# Patient Record
Sex: Male | Born: 1950
Health system: Southern US, Community
[De-identification: ages and names within clinical notes are randomized; demographics above are authoritative.]

## PROBLEM LIST (undated history)

## (undated) DIAGNOSIS — I10 Essential (primary) hypertension: Secondary | ICD-10-CM

## (undated) DIAGNOSIS — Z9889 Other specified postprocedural states: Secondary | ICD-10-CM

## (undated) DIAGNOSIS — T8859XA Other complications of anesthesia, initial encounter: Secondary | ICD-10-CM

## (undated) DIAGNOSIS — K219 Gastro-esophageal reflux disease without esophagitis: Secondary | ICD-10-CM

## (undated) DIAGNOSIS — R42 Dizziness and giddiness: Secondary | ICD-10-CM

## (undated) DIAGNOSIS — M1990 Unspecified osteoarthritis, unspecified site: Secondary | ICD-10-CM

## (undated) DIAGNOSIS — R112 Nausea with vomiting, unspecified: Secondary | ICD-10-CM

## (undated) DIAGNOSIS — T7840XA Allergy, unspecified, initial encounter: Secondary | ICD-10-CM

## (undated) DIAGNOSIS — I639 Cerebral infarction, unspecified: Secondary | ICD-10-CM

## (undated) DIAGNOSIS — T4145XA Adverse effect of unspecified anesthetic, initial encounter: Secondary | ICD-10-CM

## (undated) DIAGNOSIS — Z974 Presence of external hearing-aid: Secondary | ICD-10-CM

## (undated) DIAGNOSIS — Z972 Presence of dental prosthetic device (complete) (partial): Secondary | ICD-10-CM

## (undated) DIAGNOSIS — G4730 Sleep apnea, unspecified: Secondary | ICD-10-CM

## (undated) DIAGNOSIS — D689 Coagulation defect, unspecified: Secondary | ICD-10-CM

## (undated) DIAGNOSIS — J189 Pneumonia, unspecified organism: Secondary | ICD-10-CM

## (undated) DIAGNOSIS — G459 Transient cerebral ischemic attack, unspecified: Secondary | ICD-10-CM

## (undated) DIAGNOSIS — L570 Actinic keratosis: Secondary | ICD-10-CM

## (undated) HISTORY — PX: EYE SURGERY: SHX253

## (undated) HISTORY — DX: Allergy, unspecified, initial encounter: T78.40XA

## (undated) HISTORY — DX: Coagulation defect, unspecified: D68.9

## (undated) HISTORY — DX: Other specified postprocedural states: Z98.890

## (undated) HISTORY — PX: JOINT REPLACEMENT: SHX530

## (undated) HISTORY — DX: Actinic keratosis: L57.0

---

## 2005-11-25 HISTORY — PX: CHOLECYSTECTOMY: SHX55

## 2005-11-25 HISTORY — PX: KNEE ARTHROSCOPY: SUR90

## 2006-08-08 ENCOUNTER — Other Ambulatory Visit: Payer: Self-pay

## 2006-08-11 ENCOUNTER — Ambulatory Visit: Payer: Self-pay | Admitting: Surgery

## 2006-10-23 ENCOUNTER — Ambulatory Visit: Payer: Self-pay

## 2006-11-10 ENCOUNTER — Ambulatory Visit: Payer: Self-pay | Admitting: Orthopaedic Surgery

## 2008-09-22 ENCOUNTER — Observation Stay: Payer: Self-pay | Admitting: Cardiology

## 2010-09-20 ENCOUNTER — Ambulatory Visit: Payer: Self-pay | Admitting: Family Medicine

## 2011-01-24 DIAGNOSIS — R42 Dizziness and giddiness: Secondary | ICD-10-CM

## 2011-01-24 HISTORY — DX: Dizziness and giddiness: R42

## 2012-01-30 ENCOUNTER — Emergency Department: Payer: Self-pay | Admitting: Internal Medicine

## 2012-01-30 LAB — COMPREHENSIVE METABOLIC PANEL
Albumin: 4.1 g/dL (ref 3.4–5.0)
Alkaline Phosphatase: 123 U/L (ref 50–136)
Alkaline Phosphatase: 120 U/L (ref 50–136)
Anion Gap: 16 (ref 7–16)
BUN: 11 mg/dL (ref 7–18)
Bilirubin,Total: 1 mg/dL (ref 0.2–1.0)
Bilirubin,Total: 1 mg/dL (ref 0.2–1.0)
Calcium, Total: 8.6 mg/dL (ref 8.5–10.1)
Chloride: 105 mmol/L (ref 98–107)
Creatinine: 0.98 mg/dL (ref 0.60–1.30)
Creatinine: 0.87 mg/dL (ref 0.60–1.30)
EGFR (African American): >60
EGFR (Non-African Amer.): >60
Glucose: 96 mg/dL (ref 65–99)
Osmolality: 288 (ref 275–301)
Potassium: 3.1 mmol/L — ABNORMAL LOW (ref 3.5–5.1)
Potassium: 3.8 mmol/L (ref 3.5–5.1)
SGOT(AST): 42 U/L — ABNORMAL HIGH (ref 15–37)
SGOT(AST): 41 U/L — ABNORMAL HIGH (ref 15–37)
SGPT (ALT): 38 U/L
Sodium: 145 mmol/L (ref 136–145)
Total Protein: 7.9 g/dL (ref 6.4–8.2)
Total Protein: 7.6 g/dL (ref 6.4–8.2)

## 2012-01-30 LAB — CBC
HCT: 43.3 % (ref 40.0–52.0)
HGB: 15 g/dL (ref 13.0–18.0)
MCH: 31.1 pg (ref 26.0–34.0)
MCHC: 34.6 g/dL (ref 32.0–36.0)
MCV: 90 fL (ref 80–100)

## 2012-01-30 LAB — TROPONIN I: Troponin-I: <0.02 ng/mL

## 2012-01-30 LAB — LIPASE, BLOOD: Lipase: 130 U/L (ref 73–393)

## 2012-01-30 LAB — CK TOTAL AND CKMB (NOT AT ARMC)
CK, Total: 168 U/L (ref 35–232)
CK-MB: 1.5 ng/mL (ref 0.5–3.6)

## 2014-01-05 DIAGNOSIS — Z1339 Encounter for screening examination for other mental health and behavioral disorders: Secondary | ICD-10-CM | POA: Diagnosis not present

## 2014-01-05 DIAGNOSIS — Z131 Encounter for screening for diabetes mellitus: Secondary | ICD-10-CM | POA: Diagnosis not present

## 2014-01-05 DIAGNOSIS — Z1212 Encounter for screening for malignant neoplasm of rectum: Secondary | ICD-10-CM | POA: Diagnosis not present

## 2014-01-05 DIAGNOSIS — Z125 Encounter for screening for malignant neoplasm of prostate: Secondary | ICD-10-CM | POA: Diagnosis not present

## 2014-01-05 DIAGNOSIS — Z23 Encounter for immunization: Secondary | ICD-10-CM | POA: Diagnosis not present

## 2014-01-05 DIAGNOSIS — Z0000 Encounter for general adult medical examination without abnormal findings: Secondary | ICD-10-CM | POA: Diagnosis not present

## 2014-01-05 DIAGNOSIS — Z136 Encounter for screening for cardiovascular disorders: Secondary | ICD-10-CM | POA: Diagnosis not present

## 2014-01-17 ENCOUNTER — Ambulatory Visit: Payer: Self-pay | Admitting: Family Medicine

## 2014-01-17 DIAGNOSIS — Z87891 Personal history of nicotine dependence: Secondary | ICD-10-CM | POA: Diagnosis not present

## 2014-01-17 DIAGNOSIS — I77811 Abdominal aortic ectasia: Secondary | ICD-10-CM | POA: Diagnosis not present

## 2014-01-17 DIAGNOSIS — Z136 Encounter for screening for cardiovascular disorders: Secondary | ICD-10-CM | POA: Diagnosis not present

## 2014-01-19 DIAGNOSIS — Z1212 Encounter for screening for malignant neoplasm of rectum: Secondary | ICD-10-CM | POA: Diagnosis not present

## 2014-01-19 DIAGNOSIS — Z23 Encounter for immunization: Secondary | ICD-10-CM | POA: Diagnosis not present

## 2014-03-03 DIAGNOSIS — M1710 Unilateral primary osteoarthritis, unspecified knee: Secondary | ICD-10-CM | POA: Diagnosis not present

## 2014-05-25 DIAGNOSIS — H43819 Vitreous degeneration, unspecified eye: Secondary | ICD-10-CM | POA: Diagnosis not present

## 2014-06-28 DIAGNOSIS — H43819 Vitreous degeneration, unspecified eye: Secondary | ICD-10-CM | POA: Diagnosis not present

## 2014-07-16 DIAGNOSIS — Z23 Encounter for immunization: Secondary | ICD-10-CM | POA: Diagnosis not present

## 2014-08-30 DIAGNOSIS — M25569 Pain in unspecified knee: Secondary | ICD-10-CM | POA: Diagnosis not present

## 2014-08-30 DIAGNOSIS — M1710 Unilateral primary osteoarthritis, unspecified knee: Secondary | ICD-10-CM | POA: Diagnosis not present

## 2014-08-30 DIAGNOSIS — R269 Unspecified abnormalities of gait and mobility: Secondary | ICD-10-CM | POA: Diagnosis not present

## 2014-08-30 DIAGNOSIS — M170 Bilateral primary osteoarthritis of knee: Secondary | ICD-10-CM | POA: Diagnosis not present

## 2014-09-05 DIAGNOSIS — M9904 Segmental and somatic dysfunction of sacral region: Secondary | ICD-10-CM | POA: Diagnosis not present

## 2014-09-05 DIAGNOSIS — M4607 Spinal enthesopathy, lumbosacral region: Secondary | ICD-10-CM | POA: Diagnosis not present

## 2014-09-05 DIAGNOSIS — M9905 Segmental and somatic dysfunction of pelvic region: Secondary | ICD-10-CM | POA: Diagnosis not present

## 2014-09-05 DIAGNOSIS — M9903 Segmental and somatic dysfunction of lumbar region: Secondary | ICD-10-CM | POA: Diagnosis not present

## 2014-09-06 DIAGNOSIS — M25569 Pain in unspecified knee: Secondary | ICD-10-CM | POA: Diagnosis not present

## 2014-09-06 DIAGNOSIS — M1712 Unilateral primary osteoarthritis, left knee: Secondary | ICD-10-CM | POA: Diagnosis not present

## 2014-09-06 DIAGNOSIS — R269 Unspecified abnormalities of gait and mobility: Secondary | ICD-10-CM | POA: Diagnosis not present

## 2014-09-08 DIAGNOSIS — R269 Unspecified abnormalities of gait and mobility: Secondary | ICD-10-CM | POA: Diagnosis not present

## 2014-09-08 DIAGNOSIS — M1711 Unilateral primary osteoarthritis, right knee: Secondary | ICD-10-CM | POA: Diagnosis not present

## 2014-09-08 DIAGNOSIS — M25569 Pain in unspecified knee: Secondary | ICD-10-CM | POA: Diagnosis not present

## 2014-09-13 DIAGNOSIS — M25569 Pain in unspecified knee: Secondary | ICD-10-CM | POA: Diagnosis not present

## 2014-09-13 DIAGNOSIS — R269 Unspecified abnormalities of gait and mobility: Secondary | ICD-10-CM | POA: Diagnosis not present

## 2014-09-13 DIAGNOSIS — M1712 Unilateral primary osteoarthritis, left knee: Secondary | ICD-10-CM | POA: Diagnosis not present

## 2014-09-14 DIAGNOSIS — J309 Allergic rhinitis, unspecified: Secondary | ICD-10-CM | POA: Diagnosis not present

## 2014-09-14 DIAGNOSIS — J329 Chronic sinusitis, unspecified: Secondary | ICD-10-CM | POA: Diagnosis not present

## 2014-09-15 DIAGNOSIS — M25569 Pain in unspecified knee: Secondary | ICD-10-CM | POA: Diagnosis not present

## 2014-09-15 DIAGNOSIS — R269 Unspecified abnormalities of gait and mobility: Secondary | ICD-10-CM | POA: Diagnosis not present

## 2014-09-15 DIAGNOSIS — M1711 Unilateral primary osteoarthritis, right knee: Secondary | ICD-10-CM | POA: Diagnosis not present

## 2014-09-20 DIAGNOSIS — M25569 Pain in unspecified knee: Secondary | ICD-10-CM | POA: Diagnosis not present

## 2014-09-20 DIAGNOSIS — R269 Unspecified abnormalities of gait and mobility: Secondary | ICD-10-CM | POA: Diagnosis not present

## 2014-09-20 DIAGNOSIS — M1712 Unilateral primary osteoarthritis, left knee: Secondary | ICD-10-CM | POA: Diagnosis not present

## 2014-09-22 DIAGNOSIS — R269 Unspecified abnormalities of gait and mobility: Secondary | ICD-10-CM | POA: Diagnosis not present

## 2014-09-22 DIAGNOSIS — M25569 Pain in unspecified knee: Secondary | ICD-10-CM | POA: Diagnosis not present

## 2014-09-22 DIAGNOSIS — M1711 Unilateral primary osteoarthritis, right knee: Secondary | ICD-10-CM | POA: Diagnosis not present

## 2014-09-27 DIAGNOSIS — R269 Unspecified abnormalities of gait and mobility: Secondary | ICD-10-CM | POA: Diagnosis not present

## 2014-09-27 DIAGNOSIS — M25569 Pain in unspecified knee: Secondary | ICD-10-CM | POA: Diagnosis not present

## 2014-09-27 DIAGNOSIS — M170 Bilateral primary osteoarthritis of knee: Secondary | ICD-10-CM | POA: Diagnosis not present

## 2014-11-28 DIAGNOSIS — K5900 Constipation, unspecified: Secondary | ICD-10-CM | POA: Diagnosis not present

## 2014-11-28 DIAGNOSIS — R81 Glycosuria: Secondary | ICD-10-CM | POA: Diagnosis not present

## 2014-11-28 DIAGNOSIS — R05 Cough: Secondary | ICD-10-CM | POA: Diagnosis not present

## 2014-11-28 LAB — HEMOGLOBIN A1C: HEMOGLOBIN A1C: 5.2

## 2015-01-30 DIAGNOSIS — J0100 Acute maxillary sinusitis, unspecified: Secondary | ICD-10-CM | POA: Diagnosis not present

## 2015-01-30 DIAGNOSIS — J309 Allergic rhinitis, unspecified: Secondary | ICD-10-CM | POA: Diagnosis not present

## 2015-01-30 DIAGNOSIS — R05 Cough: Secondary | ICD-10-CM | POA: Diagnosis not present

## 2015-02-15 DIAGNOSIS — R05 Cough: Secondary | ICD-10-CM | POA: Diagnosis not present

## 2015-02-15 DIAGNOSIS — J40 Bronchitis, not specified as acute or chronic: Secondary | ICD-10-CM | POA: Diagnosis not present

## 2015-02-15 DIAGNOSIS — J019 Acute sinusitis, unspecified: Secondary | ICD-10-CM | POA: Diagnosis not present

## 2015-03-24 DIAGNOSIS — Z0000 Encounter for general adult medical examination without abnormal findings: Secondary | ICD-10-CM | POA: Diagnosis not present

## 2015-03-24 DIAGNOSIS — E782 Mixed hyperlipidemia: Secondary | ICD-10-CM | POA: Diagnosis not present

## 2015-03-24 DIAGNOSIS — M25512 Pain in left shoulder: Secondary | ICD-10-CM | POA: Diagnosis not present

## 2015-03-24 DIAGNOSIS — Z125 Encounter for screening for malignant neoplasm of prostate: Secondary | ICD-10-CM | POA: Diagnosis not present

## 2015-03-24 DIAGNOSIS — Z23 Encounter for immunization: Secondary | ICD-10-CM | POA: Diagnosis not present

## 2015-03-24 DIAGNOSIS — R040 Epistaxis: Secondary | ICD-10-CM | POA: Diagnosis not present

## 2015-03-24 LAB — LIPID PANEL
CHOLESTEROL: 195 mg/dL (ref 0–200)
HDL: 41 mg/dL (ref 35–70)
LDL CALC: 129 mg/dL
TRIGLYCERIDES: 123 mg/dL (ref 40–160)

## 2015-03-24 LAB — PSA: PSA: 1.4

## 2015-04-06 ENCOUNTER — Encounter: Payer: Self-pay | Admitting: *Deleted

## 2015-04-14 ENCOUNTER — Encounter
Admission: RE | Disposition: A | Discharge status: Home / Self Care | Payer: Self-pay | Source: Ambulatory Visit | Attending: Gastroenterology

## 2015-04-14 ENCOUNTER — Ambulatory Visit: Payer: Medicare Other | Admitting: Anesthesiology

## 2015-04-14 ENCOUNTER — Other Ambulatory Visit: Payer: Self-pay | Admitting: Gastroenterology

## 2015-04-14 ENCOUNTER — Ambulatory Visit
Admission: RE | Admit: 2015-04-14 | Discharge: 2015-04-14 | Disposition: A | Discharge status: Home / Self Care | Payer: Medicare Other | Source: Ambulatory Visit | Attending: Gastroenterology | Admitting: Gastroenterology

## 2015-04-14 ENCOUNTER — Encounter: Payer: Self-pay | Admitting: *Deleted

## 2015-04-14 DIAGNOSIS — K219 Gastro-esophageal reflux disease without esophagitis: Secondary | ICD-10-CM | POA: Insufficient documentation

## 2015-04-14 DIAGNOSIS — Z8489 Family history of other specified conditions: Secondary | ICD-10-CM | POA: Insufficient documentation

## 2015-04-14 DIAGNOSIS — Z9049 Acquired absence of other specified parts of digestive tract: Secondary | ICD-10-CM | POA: Diagnosis not present

## 2015-04-14 DIAGNOSIS — Z79899 Other long term (current) drug therapy: Secondary | ICD-10-CM | POA: Insufficient documentation

## 2015-04-14 DIAGNOSIS — K5900 Constipation, unspecified: Secondary | ICD-10-CM | POA: Diagnosis not present

## 2015-04-14 DIAGNOSIS — J329 Chronic sinusitis, unspecified: Secondary | ICD-10-CM | POA: Diagnosis not present

## 2015-04-14 DIAGNOSIS — I10 Essential (primary) hypertension: Secondary | ICD-10-CM | POA: Diagnosis not present

## 2015-04-14 DIAGNOSIS — M170 Bilateral primary osteoarthritis of knee: Secondary | ICD-10-CM | POA: Diagnosis not present

## 2015-04-14 DIAGNOSIS — Z1211 Encounter for screening for malignant neoplasm of colon: Secondary | ICD-10-CM | POA: Diagnosis not present

## 2015-04-14 DIAGNOSIS — D124 Benign neoplasm of descending colon: Secondary | ICD-10-CM | POA: Insufficient documentation

## 2015-04-14 DIAGNOSIS — E669 Obesity, unspecified: Secondary | ICD-10-CM | POA: Insufficient documentation

## 2015-04-14 DIAGNOSIS — Z6830 Body mass index (BMI) 30.0-30.9, adult: Secondary | ICD-10-CM | POA: Insufficient documentation

## 2015-04-14 DIAGNOSIS — Z87891 Personal history of nicotine dependence: Secondary | ICD-10-CM | POA: Diagnosis not present

## 2015-04-14 DIAGNOSIS — J309 Allergic rhinitis, unspecified: Secondary | ICD-10-CM | POA: Insufficient documentation

## 2015-04-14 DIAGNOSIS — K5730 Diverticulosis of large intestine without perforation or abscess without bleeding: Secondary | ICD-10-CM | POA: Diagnosis not present

## 2015-04-14 DIAGNOSIS — Z880 Allergy status to penicillin: Secondary | ICD-10-CM | POA: Diagnosis not present

## 2015-04-14 DIAGNOSIS — R42 Dizziness and giddiness: Secondary | ICD-10-CM | POA: Insufficient documentation

## 2015-04-14 DIAGNOSIS — E785 Hyperlipidemia, unspecified: Secondary | ICD-10-CM | POA: Insufficient documentation

## 2015-04-14 DIAGNOSIS — Z7982 Long term (current) use of aspirin: Secondary | ICD-10-CM | POA: Insufficient documentation

## 2015-04-14 HISTORY — DX: Gastro-esophageal reflux disease without esophagitis: K21.9

## 2015-04-14 HISTORY — DX: Presence of external hearing-aid: Z97.4

## 2015-04-14 HISTORY — PX: POLYPECTOMY: SHX149

## 2015-04-14 HISTORY — PX: COLONOSCOPY: SHX5424

## 2015-04-14 HISTORY — DX: Dizziness and giddiness: R42

## 2015-04-14 HISTORY — DX: Unspecified osteoarthritis, unspecified site: M19.90

## 2015-04-14 HISTORY — DX: Adverse effect of unspecified anesthetic, initial encounter: T41.45XA

## 2015-04-14 HISTORY — DX: Other complications of anesthesia, initial encounter: T88.59XA

## 2015-04-14 HISTORY — DX: Presence of dental prosthetic device (complete) (partial): Z97.2

## 2015-04-14 SURGERY — COLONOSCOPY
Anesthesia: Monitor Anesthesia Care | Wound class: Contaminated

## 2015-04-14 MED ORDER — SIMETHICONE 40 MG/0.6ML PO SUSP
ORAL | Status: DC | PRN
  Administered 2015-04-14: 09:00:00

## 2015-04-14 MED ORDER — PROPOFOL 10 MG/ML IV BOLUS
INTRAVENOUS | Status: DC | PRN
  Administered 2015-04-14 (×3): 50 mg via INTRAVENOUS
  Administered 2015-04-14: 100 mg via INTRAVENOUS

## 2015-04-14 MED ORDER — LACTATED RINGERS IV SOLN
INTRAVENOUS | Status: DC
  Administered 2015-04-14 (×2): via INTRAVENOUS

## 2015-04-14 MED ORDER — LIDOCAINE HCL (CARDIAC) 20 MG/ML IV SOLN
INTRAVENOUS | Status: DC | PRN
  Administered 2015-04-14: 30 mg via INTRAVENOUS

## 2015-04-14 SURGICAL SUPPLY — 27 items
CANISTER SUCT 1200ML W/VALVE (MISCELLANEOUS) ×4 IMPLANT
FCP ESCP3.2XJMB 240X2.8X (MISCELLANEOUS)
FORCEPS BIOP RAD 4 LRG CAP 4 (CUTTING FORCEPS) ×4 IMPLANT
FORCEPS BIOP RJ4 240 W/NDL (MISCELLANEOUS)
FORCEPS ESCP3.2XJMB 240X2.8X (MISCELLANEOUS) IMPLANT
GOWN CVR UNV OPN BCK APRN NK (MISCELLANEOUS) ×4 IMPLANT
GOWN ISOL THUMB LOOP REG UNIV (MISCELLANEOUS) ×4
HEMOCLIP INSTINCT (CLIP) IMPLANT
INJECTOR VARIJECT VIN23 (MISCELLANEOUS) IMPLANT
KIT CO2 TUBING (TUBING) ×4 IMPLANT
KIT DEFENDO VALVE AND CONN (KITS) IMPLANT
KIT ENDO PROCEDURE OLY (KITS) ×4 IMPLANT
LIGATOR MULTIBAND 6SHOOTER MBL (MISCELLANEOUS) IMPLANT
MARKER SPOT ENDO TATTOO 5ML (MISCELLANEOUS) IMPLANT
PAD GROUND ADULT SPLIT (MISCELLANEOUS) IMPLANT
SNARE SHORT THROW 13M SML OVAL (MISCELLANEOUS) IMPLANT
SNARE SHORT THROW 30M LRG OVAL (MISCELLANEOUS) IMPLANT
SPOT EX ENDOSCOPIC TATTOO (MISCELLANEOUS)
SUCTION POLY TRAP 4CHAMBER (MISCELLANEOUS) IMPLANT
TRAP SUCTION POLY (MISCELLANEOUS) IMPLANT
TUBING CONN 6MMX3.1M (TUBING)
TUBING SUCTION CONN 0.25 STRL (TUBING) IMPLANT
UNDERPAD 30X60 958B10 (PK) (MISCELLANEOUS) IMPLANT
VALVE BIOPSY ENDO (VALVE) IMPLANT
VARIJECT INJECTOR VIN23 (MISCELLANEOUS)
WATER AUXILLARY (MISCELLANEOUS) IMPLANT
WATER STERILE IRR 500ML POUR (IV SOLUTION) ×4 IMPLANT

## 2015-04-14 NOTE — Transfer of Care (Signed)
Immediate Anesthesia Transfer of Care Note  Patient: James Compton  Procedure(s) Performed: Procedure(s): COLONOSCOPY (N/A)  Patient Location: PACU  Anesthesia Type: MAC  Level of Consciousness: awake, alert  and patient cooperative  Airway and Oxygen Therapy: Patient Spontanous Breathing and Patient connected to supplemental oxygen  Post-op Assessment: Post-op Vital signs reviewed, Patient's Cardiovascular Status Stable, Respiratory Function Stable, Patent Airway and No signs of Nausea or vomiting  Post-op Vital Signs: Reviewed and stable  Complications: No apparent anesthesia complications

## 2015-04-14 NOTE — Op Note (Signed)
[facility] Patient Name: James Compton Procedure Date: 04/14/2015 8:16 AM MRN: [id number] Account #: [id number] Date of Birth: [birth year] Admit Type: Outpatient Age: 64 Room: [facility] OR ROOM 01 Gender: Male Note Status: Finalized Procedure:         Colonoscopy Indications:       Screening for colorectal malignant neoplasm Providers:         Lucilla Lame, MD Medicines:         Propofol per Anesthesia Complications:     No immediate complications. Procedure:         Pre-Anesthesia Assessment:                    - Prior to the procedure, a History and Physical was                     performed, and patient medications and allergies were                     reviewed. The patient's tolerance of previous anesthesia                     was also reviewed. The risks and benefits of the procedure                     and the sedation options and risks were discussed with the                     patient. All questions were answered, and informed consent                     was obtained. Prior Anticoagulants: The patient has taken                     no previous anticoagulant or antiplatelet agents. ASA                     Grade Assessment: II - A patient with mild systemic                     disease. After reviewing the risks and benefits, the                     patient was deemed in satisfactory condition to undergo                     the procedure.                    After obtaining informed consent, the colonoscope was                     passed under direct vision. Throughout the procedure, the                     patient's blood pressure, pulse, and oxygen saturations                     were monitored continuously. The Olympus CF H180AL                     colonoscope (S#: [id number]) was introduced through the anus                     and advanced to the the cecum, identified by  appendiceal                     orifice and ileocecal valve. The colonoscopy was  performed                     without difficulty. The patient tolerated the procedure                     well. The quality of the bowel preparation was excellent. Findings:      The perianal and digital rectal examinations were normal.      A 3 mm polyp was found in the descending colon. The polyp was sessile.       The polyp was removed with a cold biopsy forceps. Resection and       retrieval were complete.      Multiple small-mouthed diverticula were found in the sigmoid colon. Impression:        - One 3 mm polyp in the descending colon. Resected and                     retrieved.                    - Diverticulosis in the sigmoid colon. Recommendation:    - Await pathology results.                    - Repeat colonoscopy in 5 years if polyp adenoma and 10                     years if hyperplastic Procedure Code(s): --- Professional ---                    [phone], Colonoscopy, flexible; with biopsy, single or                     multiple Diagnosis Code(s): --- Professional ---                    Z12.11, Encounter for screening for malignant neoplasm of                     colon                    D12.4, Benign neoplasm of descending colon CPT copyright 2014 American Medical Association. All rights reserved. The codes documented in this report are preliminary and upon coder review may  be revised to meet current compliance requirements. Lucilla Lame, MD 04/14/2015 8:42:48 AM This report has been signed electronically. Number of Addenda: 0 Note Initiated On: 04/14/2015 8:16 AM Scope Withdrawal Time: 0 hours 8 minutes 6 seconds  Total Procedure Duration: 0 hours 11 minutes 56 seconds       [facility]

## 2015-04-14 NOTE — Anesthesia Postprocedure Evaluation (Signed)
  Anesthesia Post-op Note  Patient: James Compton  Procedure(s) Performed: Procedure(s): COLONOSCOPY (N/A)  Anesthesia type:MAC  Patient location: PACU  Post pain: Pain level controlled  Post assessment: Post-op Vital signs reviewed, Patient's Cardiovascular Status Stable, Respiratory Function Stable, Patent Airway and No signs of Nausea or vomiting  Post vital signs: Reviewed and stable  Last Vitals:  Filed Vitals:   04/14/15 0859  BP: 112/86  Pulse: 65  Temp:   Resp: 18    Level of consciousness: awake, alert  and patient cooperative  Complications: No apparent anesthesia complications

## 2015-04-14 NOTE — Anesthesia Preprocedure Evaluation (Addendum)
Anesthesia Evaluation  Patient identified by MRN, date of birth, ID band Patient awake  General Assessment Comment:Post-op ileus   Reviewed: Allergy & Precautions, H&P , NPO status , Patient's Chart, lab work & pertinent test results, reviewed documented beta blocker date and time   History of Anesthesia Complications (+) history of anesthetic complications  Airway Mallampati: II  TM Distance: >3 FB Neck ROM: full    Dental no notable dental hx.    Pulmonary neg pulmonary ROS, former smoker,  breath sounds clear to auscultation  Pulmonary exam normal       Cardiovascular Exercise Tolerance: Good negative cardio ROS  Rhythm:regular Rate:Normal     Neuro/Psych negative neurological ROS  negative psych ROS   GI/Hepatic Neg liver ROS, GERD-  Medicated,  Endo/Other  negative endocrine ROS  Renal/GU negative Renal ROS  negative genitourinary   Musculoskeletal   Abdominal   Peds  Hematology negative hematology ROS (+)   Anesthesia Other Findings   Reproductive/Obstetrics negative OB ROS                           Anesthesia Physical Anesthesia Plan  ASA: II  Anesthesia Plan: MAC   Post-op Pain Management:    Induction:   Airway Management Planned:   Additional Equipment:   Intra-op Plan:   Post-operative Plan:   Informed Consent: I have reviewed the patients History and Physical, chart, labs and discussed the procedure including the risks, benefits and alternatives for the proposed anesthesia with the patient or authorized representative who has indicated his/her understanding and acceptance.   Dental Advisory Given  Plan Discussed with: CRNA  Anesthesia Plan Comments:         Anesthesia Quick Evaluation

## 2015-04-14 NOTE — H&P (Signed)
  [facility]  [address], [place], [place] [address] Phone: [phone] Fax : [phone]  Primary Care Physician:  Lelon Huh, MD Primary Gastroenterologist:  Dr. Allen Norris  Pre-Procedure History & Physical: HPI:  James Compton is a 64 y.o. male is here for a screening colonoscopy.   Past Medical History  Diagnosis Date  . Vertigo 01/2011  . Complication of anesthesia     bowels "feel asleep" after knee surgery  . Wears hearing aid     bilateral  . GERD (gastroesophageal reflux disease)     occas  . Arthritis     Osteo - knees  . Wears partial dentures     upper    Past Surgical History  Procedure Laterality Date  . Knee arthroscopy Right 2007  . Cholecystectomy  2007    Prior to Admission medications   Medication Sig Start Date End Date Taking? Authorizing Provider  aspirin 81 MG tablet Take 81 mg by mouth daily. PM   Yes Historical Provider, MD  fluticasone (FLONASE) 50 MCG/ACT nasal spray Place into both nostrils as needed for allergies or rhinitis.   Yes Historical Provider, MD  Multiple Vitamin (MULTIVITAMIN) capsule Take 1 capsule by mouth daily. PM   Yes Historical Provider, MD  Omega-3 Fatty Acids (FISH OIL) 1000 MG CAPS Take 2 capsules by mouth daily. PM   Yes Historical Provider, MD  Probiotic Product (PROBIOTIC DAILY PO) Take by mouth. PM   Yes Historical Provider, MD    Allergies as of 04/05/2015  . (Not on File)    History reviewed. No pertinent family history.  History   Social History  . Marital Status: Married    Spouse Name: N/A  . Number of Children: N/A  . Years of Education: N/A   Occupational History  . Not on file.   Social History Main Topics  . Smoking status: Former Smoker    Quit date: 11/25/1970  . Smokeless tobacco: Not on file  . Alcohol Use: No  . Drug Use: Not on file  . Sexual Activity: Not on file   Other Topics Concern  . Not on file   Social History Narrative    Review of Systems: See  HPI, otherwise negative ROS  Physical Exam: BP 164/95 mmHg  Pulse 62  Temp(Src) 98.1 F (36.7 C)  Resp 17  Ht 5\' 8"  (1.727 m)  Wt 194 lb (87.998 kg)  BMI 29.50 kg/m2  SpO2 99% General:   Alert,  pleasant and cooperative in NAD Head:  Normocephalic and atraumatic. Neck:  Supple; no masses or thyromegaly. Lungs:  Clear throughout to auscultation.    Heart:  Regular rate and rhythm. Abdomen:  Soft, nontender and nondistended. Normal bowel sounds, without guarding, and without rebound.   Neurologic:  Alert and  oriented x4;  grossly normal neurologically.  Impression/Plan: [name] is now here to undergo a screening colonoscopy.  Risks, benefits, and alternatives regarding colonoscopy have been reviewed with the patient.  Questions have been answered.  All parties agreeable.

## 2015-04-14 NOTE — Anesthesia Procedure Notes (Signed)
Procedure Name: MAC Performed by: Ahmad Vanwey Pre-anesthesia Checklist: Patient identified, Emergency Drugs available, Suction available, Patient being monitored and Timeout performed Patient Re-evaluated:Patient Re-evaluated prior to inductionOxygen Delivery Method: Nasal cannula Placement Confirmation: positive ETCO2 and breath sounds checked- equal and bilateral     

## 2015-04-15 ENCOUNTER — Encounter: Payer: Self-pay | Admitting: Gastroenterology

## 2015-05-31 DIAGNOSIS — H52223 Regular astigmatism, bilateral: Secondary | ICD-10-CM | POA: Diagnosis not present

## 2015-05-31 DIAGNOSIS — H25813 Combined forms of age-related cataract, bilateral: Secondary | ICD-10-CM | POA: Diagnosis not present

## 2015-05-31 DIAGNOSIS — H43813 Vitreous degeneration, bilateral: Secondary | ICD-10-CM | POA: Diagnosis not present

## 2015-05-31 DIAGNOSIS — H5213 Myopia, bilateral: Secondary | ICD-10-CM | POA: Diagnosis not present

## 2015-05-31 DIAGNOSIS — H524 Presbyopia: Secondary | ICD-10-CM | POA: Diagnosis not present

## 2015-07-07 DIAGNOSIS — M25562 Pain in left knee: Secondary | ICD-10-CM | POA: Diagnosis not present

## 2015-07-07 DIAGNOSIS — M25561 Pain in right knee: Secondary | ICD-10-CM | POA: Diagnosis not present

## 2015-07-07 DIAGNOSIS — R2689 Other abnormalities of gait and mobility: Secondary | ICD-10-CM | POA: Diagnosis not present

## 2015-07-07 DIAGNOSIS — M170 Bilateral primary osteoarthritis of knee: Secondary | ICD-10-CM | POA: Diagnosis not present

## 2015-07-10 DIAGNOSIS — M1711 Unilateral primary osteoarthritis, right knee: Secondary | ICD-10-CM | POA: Diagnosis not present

## 2015-07-10 DIAGNOSIS — M25561 Pain in right knee: Secondary | ICD-10-CM | POA: Diagnosis not present

## 2015-07-14 DIAGNOSIS — M170 Bilateral primary osteoarthritis of knee: Secondary | ICD-10-CM | POA: Diagnosis not present

## 2015-07-14 DIAGNOSIS — M25561 Pain in right knee: Secondary | ICD-10-CM | POA: Diagnosis not present

## 2015-07-14 DIAGNOSIS — M25562 Pain in left knee: Secondary | ICD-10-CM | POA: Diagnosis not present

## 2015-07-14 DIAGNOSIS — M1712 Unilateral primary osteoarthritis, left knee: Secondary | ICD-10-CM | POA: Diagnosis not present

## 2015-07-14 DIAGNOSIS — R2689 Other abnormalities of gait and mobility: Secondary | ICD-10-CM | POA: Diagnosis not present

## 2015-07-19 DIAGNOSIS — R2689 Other abnormalities of gait and mobility: Secondary | ICD-10-CM | POA: Diagnosis not present

## 2015-07-19 DIAGNOSIS — M1711 Unilateral primary osteoarthritis, right knee: Secondary | ICD-10-CM | POA: Diagnosis not present

## 2015-07-19 DIAGNOSIS — M25562 Pain in left knee: Secondary | ICD-10-CM | POA: Diagnosis not present

## 2015-07-19 DIAGNOSIS — M170 Bilateral primary osteoarthritis of knee: Secondary | ICD-10-CM | POA: Diagnosis not present

## 2015-07-19 DIAGNOSIS — M25561 Pain in right knee: Secondary | ICD-10-CM | POA: Diagnosis not present

## 2015-07-20 DIAGNOSIS — M1712 Unilateral primary osteoarthritis, left knee: Secondary | ICD-10-CM | POA: Diagnosis not present

## 2015-07-20 DIAGNOSIS — R2689 Other abnormalities of gait and mobility: Secondary | ICD-10-CM | POA: Diagnosis not present

## 2015-07-20 DIAGNOSIS — M25562 Pain in left knee: Secondary | ICD-10-CM | POA: Diagnosis not present

## 2015-07-20 DIAGNOSIS — M170 Bilateral primary osteoarthritis of knee: Secondary | ICD-10-CM | POA: Diagnosis not present

## 2015-07-20 DIAGNOSIS — M25561 Pain in right knee: Secondary | ICD-10-CM | POA: Diagnosis not present

## 2015-07-25 DIAGNOSIS — R2689 Other abnormalities of gait and mobility: Secondary | ICD-10-CM | POA: Diagnosis not present

## 2015-07-25 DIAGNOSIS — M25561 Pain in right knee: Secondary | ICD-10-CM | POA: Diagnosis not present

## 2015-07-25 DIAGNOSIS — M170 Bilateral primary osteoarthritis of knee: Secondary | ICD-10-CM | POA: Diagnosis not present

## 2015-07-25 DIAGNOSIS — M1711 Unilateral primary osteoarthritis, right knee: Secondary | ICD-10-CM | POA: Diagnosis not present

## 2015-07-25 DIAGNOSIS — M25562 Pain in left knee: Secondary | ICD-10-CM | POA: Diagnosis not present

## 2015-07-27 DIAGNOSIS — M1712 Unilateral primary osteoarthritis, left knee: Secondary | ICD-10-CM | POA: Diagnosis not present

## 2015-07-27 DIAGNOSIS — M25562 Pain in left knee: Secondary | ICD-10-CM | POA: Diagnosis not present

## 2015-08-01 DIAGNOSIS — M170 Bilateral primary osteoarthritis of knee: Secondary | ICD-10-CM | POA: Diagnosis not present

## 2015-08-01 DIAGNOSIS — M25561 Pain in right knee: Secondary | ICD-10-CM | POA: Diagnosis not present

## 2015-08-01 DIAGNOSIS — M25562 Pain in left knee: Secondary | ICD-10-CM | POA: Diagnosis not present

## 2016-01-16 ENCOUNTER — Ambulatory Visit (INDEPENDENT_AMBULATORY_CARE_PROVIDER_SITE_OTHER): Payer: Medicare Other | Admitting: Family Medicine

## 2016-01-16 ENCOUNTER — Encounter: Payer: Self-pay | Admitting: Family Medicine

## 2016-01-16 VITALS — BP 126/70 | HR 60 | Temp 98.7°F | Resp 16 | Ht 68.0 in | Wt 205.0 lb

## 2016-01-16 DIAGNOSIS — R42 Dizziness and giddiness: Secondary | ICD-10-CM

## 2016-01-16 DIAGNOSIS — R040 Epistaxis: Secondary | ICD-10-CM

## 2016-01-16 NOTE — Progress Notes (Signed)
     Subjective:    Patient ID: James Compton, male    DOB: 1950-11-30, 65 y.o.   MRN: [id number]  Dizziness Episode onset: one episode on Sunday. Associated symptoms include chills, coughing (times 5 days), diaphoresis and headaches. Pertinent negatives include no congestion, fatigue, fever, nausea, sore throat or vomiting. Nothing aggravates the symptoms. He has tried nothing for the symptoms.   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 It lasted a few minutes a went away. 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 Has since felt completely normal.    Review of Systems  Constitutional: Positive for chills and diaphoresis. Negative for fever and fatigue.  HENT: Positive for nosebleeds (one episode Sunday, bleeding from right side) and sneezing. Negative for congestion, ear pain, postnasal drip, rhinorrhea, sinus pressure and sore throat.   Respiratory: Positive for cough (times 5 days) and wheezing.   Cardiovascular: Negative.   Gastrointestinal: Negative for nausea and vomiting.  Neurological: Positive for dizziness and headaches.      Blood pressure 126/70, pulse 60, temperature 98.7 F (37.1 C), temperature source Oral, resp. rate 16, height 5\' 8"  (1.727 m), weight 205 lb (92.987 kg), SpO2 98 %. Objective:   Physical Exam  General Appearance:    Alert, cooperative, no distress  HENT:   neck without nodes, throat normal without erythema or exudate, sinuses nontender and nasal mucosa congested  Eyes:    PERRL, conjunctiva/corneas clear, EOM's intact       Lungs:     Clear to auscultation bilaterally, respirations unlabored  Heart:    Regular rate and rhythm  Neurologic:   Awake, alert, oriented x 3. No apparent focal neurological           defect.          Assessment & Plan:   1. Dizziness He has had a few URI symptoms the last few days which may have contributed to  symptoms.  - EKG 12-Lead - CBC - Comprehensive metabolic panel - Troponin I  2. Epistaxis Now resolved.

## 2016-01-17 LAB — COMPREHENSIVE METABOLIC PANEL
ALBUMIN: 4.4 g/dL (ref 3.6–4.8)
ALT: 18 IU/L (ref 0–44)
AST: 29 IU/L (ref 0–40)
Albumin/Globulin Ratio: 1.6 (ref 1.1–2.5)
Alkaline Phosphatase: 128 IU/L — ABNORMAL HIGH (ref 39–117)
BUN/Creatinine Ratio: 14 (ref 10–22)
BUN: 12 mg/dL (ref 8–27)
Bilirubin Total: 0.9 mg/dL (ref 0.0–1.2)
CALCIUM: 9.5 mg/dL (ref 8.6–10.2)
CO2: 25 mmol/L (ref 18–29)
CREATININE: 0.83 mg/dL (ref 0.76–1.27)
Chloride: 98 mmol/L (ref 96–106)
GFR, EST AFRICAN AMERICAN: 105 mL/min/{1.73_m2} (ref >59)
GFR, EST NON AFRICAN AMERICAN: 91 mL/min/{1.73_m2} (ref >59)
GLOBULIN, TOTAL: 2.7 g/dL (ref 1.5–4.5)
GLUCOSE: 99 mg/dL (ref 65–99)
Potassium: 4.5 mmol/L (ref 3.5–5.2)
Sodium: 141 mmol/L (ref 134–144)
TOTAL PROTEIN: 7.1 g/dL (ref 6.0–8.5)

## 2016-01-17 LAB — CBC
HEMATOCRIT: 43.9 % (ref 37.5–51.0)
HEMOGLOBIN: 15.4 g/dL (ref 12.6–17.7)
MCH: 31 pg (ref 26.6–33.0)
MCHC: 35.1 g/dL (ref 31.5–35.7)
MCV: 88 fL (ref 79–97)
Platelets: 205 10*3/uL (ref 150–379)
RBC: 4.97 x10E6/uL (ref 4.14–5.80)
RDW: 13.5 % (ref 12.3–15.4)
WBC: 7.1 10*3/uL (ref 3.4–10.8)

## 2016-01-17 LAB — TROPONIN I

## 2016-06-06 DIAGNOSIS — H2513 Age-related nuclear cataract, bilateral: Secondary | ICD-10-CM | POA: Diagnosis not present

## 2016-06-06 DIAGNOSIS — H43813 Vitreous degeneration, bilateral: Secondary | ICD-10-CM | POA: Diagnosis not present

## 2016-06-06 DIAGNOSIS — H25013 Cortical age-related cataract, bilateral: Secondary | ICD-10-CM | POA: Diagnosis not present

## 2016-07-22 ENCOUNTER — Other Ambulatory Visit: Payer: Self-pay

## 2016-08-30 DIAGNOSIS — Z23 Encounter for immunization: Secondary | ICD-10-CM | POA: Diagnosis not present

## 2016-10-14 ENCOUNTER — Telehealth: Payer: Self-pay | Admitting: Family Medicine

## 2016-10-14 DIAGNOSIS — Z01110 Encounter for hearing examination following failed hearing screening: Secondary | ICD-10-CM

## 2016-10-14 NOTE — Telephone Encounter (Signed)
Okay for referral?

## 2016-10-14 NOTE — Telephone Encounter (Signed)
Pt had DOT at the [facility] and failed the hearing test.  She said he had to have a referral to [facility].  Patients call back is [phone]  Thanks Con Memos

## 2016-10-15 DIAGNOSIS — Z01110 Encounter for hearing examination following failed hearing screening: Secondary | ICD-10-CM | POA: Insufficient documentation

## 2016-10-15 NOTE — Telephone Encounter (Signed)
Please refer ent for failed hearing test.

## 2016-10-16 ENCOUNTER — Telehealth: Payer: Self-pay | Admitting: Family Medicine

## 2016-10-16 DIAGNOSIS — Z125 Encounter for screening for malignant neoplasm of prostate: Secondary | ICD-10-CM

## 2016-10-16 DIAGNOSIS — E785 Hyperlipidemia, unspecified: Secondary | ICD-10-CM

## 2016-10-16 DIAGNOSIS — Z131 Encounter for screening for diabetes mellitus: Secondary | ICD-10-CM

## 2016-10-16 NOTE — Telephone Encounter (Signed)
Patient reports that he has Medicare Part B and BCBS as a supplement. KW

## 2016-10-16 NOTE — Telephone Encounter (Signed)
Please advise. KW 

## 2016-10-16 NOTE — Telephone Encounter (Signed)
Pt is scheduled for CPE on 10/21/16 and would like to go ahead and pick up the lab slip to get his labs done before the appt. Please advise. Thanks TNP

## 2016-10-16 NOTE — Telephone Encounter (Signed)
Scan was from last year. Need to check with patient to see if he has same insurance or if is gone to Medicare now. Medicare does not cover the same labs that private insurance covers, so he may get a lab bill for hundreds of dollars if his insurance has changed.

## 2016-10-16 NOTE — Telephone Encounter (Signed)
Patient has been advised. KW 

## 2016-10-16 NOTE — Telephone Encounter (Signed)
According to image scanned in chart patient has BCBS

## 2016-10-16 NOTE — Telephone Encounter (Signed)
Order is printed and ready to pick up at front desk. Needs to be fasting.

## 2016-10-16 NOTE — Telephone Encounter (Signed)
What kind of insurance does he have.

## 2016-10-21 ENCOUNTER — Ambulatory Visit (INDEPENDENT_AMBULATORY_CARE_PROVIDER_SITE_OTHER): Payer: Medicare Other | Admitting: Family Medicine

## 2016-10-21 ENCOUNTER — Encounter: Payer: Self-pay | Admitting: Family Medicine

## 2016-10-21 VITALS — BP 126/78 | HR 60 | Temp 98.6°F | Resp 16 | Ht 68.0 in | Wt 200.0 lb

## 2016-10-21 DIAGNOSIS — E785 Hyperlipidemia, unspecified: Secondary | ICD-10-CM | POA: Diagnosis not present

## 2016-10-21 DIAGNOSIS — R040 Epistaxis: Secondary | ICD-10-CM | POA: Diagnosis not present

## 2016-10-21 DIAGNOSIS — Z0000 Encounter for general adult medical examination without abnormal findings: Secondary | ICD-10-CM

## 2016-10-21 DIAGNOSIS — Z131 Encounter for screening for diabetes mellitus: Secondary | ICD-10-CM | POA: Diagnosis not present

## 2016-10-21 DIAGNOSIS — I77811 Abdominal aortic ectasia: Secondary | ICD-10-CM

## 2016-10-21 DIAGNOSIS — Z125 Encounter for screening for malignant neoplasm of prostate: Secondary | ICD-10-CM | POA: Diagnosis not present

## 2016-10-21 DIAGNOSIS — H903 Sensorineural hearing loss, bilateral: Secondary | ICD-10-CM | POA: Diagnosis not present

## 2016-10-21 NOTE — Progress Notes (Signed)
Patient: James Compton, Male    DOB: 02-04-1951, 65 y.o.   MRN: [id number] Visit Date: 10/21/2016  Today's Provider: Lelon Huh, MD   Chief Complaint  Patient presents with  . Annual Wellness Visit  . Hyperlipidemia   Subjective:    Annual wellness visit James Compton is a 65 y.o. male. He feels well. He reports exercising occasionally, but he stays really active. He reports he is sleeping well.  Colonoscopy- 04/14/2015. 1 polyp removed. Diverticulosis. Repeat in 5-10 years.     Lipid/Cholesterol, Follow-up:   Last seen for this1 years ago.  Management changes since that visit include no changes. . Last Lipid Panel:    Component Value Date/Time   CHOL 195 03/24/2015   TRIG 123 03/24/2015   HDL 41 03/24/2015   LDLCALC 129 03/24/2015    He reports good compliance with treatment. He is not having side effects.  Current symptoms include none and have been stable. Weight trend: stable Prior visit with dietician: no Current diet: well balanced Current exercise: walking a lot at his job.   Wt Readings from Last 3 Encounters:  10/21/16 200 lb (90.7 kg)  03/24/15 196 lb (88.9 kg)  01/16/16 205 lb (93 kg)     His only complaint today is that he continues to have nosebleeds in his right nostril only. They are not frequent, but seem to be getting worse the last several months. He does use steroid nasal spray intermittently, but states he gets nose bleeds whether or not he is using nose spray. No other abnormal bleeding.   Review of Systems  Constitutional: Negative.   HENT: Positive for nosebleeds. Negative for congestion, facial swelling, postnasal drip, rhinorrhea, sinus pain, sinus pressure, sneezing and sore throat.   Eyes: Negative.   Respiratory: Negative.   Cardiovascular: Negative.   Gastrointestinal: Negative.   Endocrine: Negative.   Genitourinary: Negative.   Musculoskeletal: Positive for arthralgias. Negative for back pain, gait problem, joint  swelling, myalgias, neck pain and neck stiffness.  Skin: Negative.   Allergic/Immunologic: Negative.   Neurological: Negative.   Hematological: Negative.   Psychiatric/Behavioral: Negative.     Social History   Social History  . Marital status: Married    Spouse name: N/A  . Number of children: N/A  . Years of education: N/A   Occupational History  . Not on file.   Social History Main Topics  . Smoking status: Former Smoker    Quit date: 11/25/1970  . Smokeless tobacco: Never Used  . Alcohol use No  . Drug use: Unknown  . Sexual activity: Not on file   Other Topics Concern  . Not on file   Social History Narrative  . No narrative on file    Past Medical History:  Diagnosis Date  . Arthritis    Osteo - knees  . Complication of anesthesia    bowels "feel asleep" after knee surgery  . GERD (gastroesophageal reflux disease)    occas  . Vertigo 01/2011  . Wears hearing aid    bilateral  . Wears partial dentures    upper     Patient Active Problem List   Diagnosis Date Noted  . Aortic ectasia, abdominal ([place]) 10/21/2016  . Encounter for hearing examination after failed hearing test 10/15/2016    Past Surgical History:  Procedure Laterality Date  . CHOLECYSTECTOMY  2007  . COLONOSCOPY N/A 04/14/2015   Procedure: COLONOSCOPY;  Surgeon: Lucilla Lame, MD;  Location: [facility];  Service: Gastroenterology;  Laterality: N/A;  . KNEE ARTHROSCOPY Right 2007  . POLYPECTOMY  04/14/2015   Procedure: POLYPECTOMY INTESTINAL;  Surgeon: Lucilla Lame, MD;  Location: [facility];  Service: Gastroenterology;;    His family history includes Alzheimer's disease in his mother; Diabetes in his father; Healthy in his brother, daughter, daughter, sister, and son.     Current Meds  Medication Sig  . aspirin 81 MG tablet Take 81 mg by mouth daily. PM  . fluticasone (FLONASE) 50 MCG/ACT nasal spray Place 2 sprays into both nostrils daily.   . Multiple Vitamin  (MULTIVITAMIN) capsule Take 1 capsule by mouth daily. PM  . Omega-3 Fatty Acids (FISH OIL) 1000 MG CAPS Take 2 capsules by mouth daily. PM  . Probiotic Product (PROBIOTIC DAILY PO) Take 1 capsule by mouth daily. PM    Patient Care Team: [name], MD as PCP - General (Family Medicine)     Objective:   Vitals: BP 126/78 (BP Location: Left Arm, Patient Position: Sitting, Cuff Size: Large)   Pulse 60   Temp 98.6 F (37 C)   Resp 16   Ht 5\' 8"  (1.727 m)   Wt 200 lb (90.7 kg)   SpO2 98%   BMI 30.41 kg/m   Physical Exam   General Appearance:    Alert, cooperative, no distress  Eyes:    PERRL, conjunctiva/corneas clear, EOM's intact       Lungs:     Clear to auscultation bilaterally, respirations unlabored  Heart:    Regular rate and rhythm  Neurologic:   Awake, alert, oriented x 3. No apparent focal neurological           defect.        Activities of Daily Living In your present state of health, do you have any difficulty performing the following activities: 10/21/2016  Hearing? Y  Vision? N  Difficulty concentrating or making decisions? N  Walking or climbing stairs? Y  Dressing or bathing? N  Doing errands, shopping? N  Some recent data might be hidden    Fall Risk Assessment Fall Risk  10/21/2016  Falls in the past year? No     Depression Screen PHQ 2/9 Scores 10/21/2016  PHQ - 2 Score 0    Cognitive Testing - 6-CIT  Correct? Score   What year is it? yes 0 0 or 4  What month is it? yes 0 0 or 3  Memorize:    Pia Mau,  40,  [address]  [place],      What time is it? (within 1 hour) yes 0 0 or 3  Count backwards from 20 yes 0 0, 2, or 4  Name the months of the year yes 0 0, 2, or 4  Repeat name & address above yes 0 0, 2, 4, 6, 8, or 10       TOTAL SCORE  0/28   Interpretation:  Normal  Normal (0-7) Abnormal (8-28)       Assessment & Plan:     Annual Wellness Visit  Reviewed patient's Family Medical History Reviewed and updated list  of patient's medical providers Assessment of cognitive impairment was done Assessed patient's functional ability Established a written schedule for health screening [place] Completed and Reviewed  Exercise Activities and Dietary recommendations Goals    None      Immunization History  Administered Date(s) Administered  . Pneumococcal Conjugate-13 01/19/2014  . Pneumococcal Polysaccharide-23 03/24/2015  .  Td 07/04/2004, 09/25/2010  . Tdap 09/25/2010  . Zoster 09/17/2011    Health Maintenance  Topic Date Due  . Hepatitis C Screening  1951/04/01  . INFLUENZA VACCINE  01/15/2017 (Originally 06/25/2016)  . TETANUS/TDAP  09/25/2020  . COLONOSCOPY  04/13/2025  . ZOSTAVAX  Completed  . PNA vac Low Risk Adult  Completed     Discussed health benefits of physical activity, and encouraged him to engage in regular exercise appropriate for his age and condition.    ------------------------------------------------------------------------------------------------------------  1. Annual wellness visit Had blood drawn for lipids, glucose, and PSA this morning.   2. Epistaxis Recurrent for the last few years and getting more persistent.  - Ambulatory referral to ENT  3. Aortic ectasia, abdominal ([place]) Repeat aortic ultrasound 2020    Lelon Huh, MD  [facility]

## 2016-10-22 ENCOUNTER — Telehealth: Payer: Self-pay

## 2016-10-22 DIAGNOSIS — M25561 Pain in right knee: Secondary | ICD-10-CM | POA: Diagnosis not present

## 2016-10-22 DIAGNOSIS — M170 Bilateral primary osteoarthritis of knee: Secondary | ICD-10-CM | POA: Diagnosis not present

## 2016-10-22 DIAGNOSIS — M25562 Pain in left knee: Secondary | ICD-10-CM | POA: Diagnosis not present

## 2016-10-22 LAB — LIPID PANEL
CHOL/HDL RATIO: 4.5 ratio (ref 0.0–5.0)
Cholesterol, Total: 190 mg/dL (ref 100–199)
HDL: 42 mg/dL (ref >39)
LDL Calculated: 120 mg/dL — ABNORMAL HIGH (ref 0–99)
Triglycerides: 138 mg/dL (ref 0–149)
VLDL Cholesterol Cal: 28 mg/dL (ref 5–40)

## 2016-10-22 LAB — PSA: PROSTATE SPECIFIC AG, SERUM: 1 ng/mL (ref 0.0–4.0)

## 2016-10-22 LAB — GLUCOSE, RANDOM: GLUCOSE: 99 mg/dL (ref 65–99)

## 2016-10-22 NOTE — Telephone Encounter (Signed)
Patient has been advised. KW 

## 2016-10-22 NOTE — Telephone Encounter (Signed)
-----   Message from [name], MD sent at 10/22/2016  7:50 AM EST ----- Cholesterol better this yearly ldl is down from 129 to 120. Normal blood sugar and psa. Check yearly.

## 2016-10-29 DIAGNOSIS — M25561 Pain in right knee: Secondary | ICD-10-CM | POA: Diagnosis not present

## 2016-10-29 DIAGNOSIS — M1711 Unilateral primary osteoarthritis, right knee: Secondary | ICD-10-CM | POA: Diagnosis not present

## 2016-10-31 DIAGNOSIS — M25562 Pain in left knee: Secondary | ICD-10-CM | POA: Diagnosis not present

## 2016-10-31 DIAGNOSIS — J340 Abscess, furuncle and carbuncle of nose: Secondary | ICD-10-CM | POA: Diagnosis not present

## 2016-10-31 DIAGNOSIS — M1712 Unilateral primary osteoarthritis, left knee: Secondary | ICD-10-CM | POA: Diagnosis not present

## 2016-10-31 DIAGNOSIS — R040 Epistaxis: Secondary | ICD-10-CM | POA: Diagnosis not present

## 2016-11-03 ENCOUNTER — Emergency Department: Payer: Medicare Other

## 2016-11-03 ENCOUNTER — Observation Stay
Admission: EM | Admit: 2016-11-03 | Discharge: 2016-11-04 | Disposition: A | Discharge status: Home / Self Care | Payer: Medicare Other | Attending: Internal Medicine | Admitting: Internal Medicine

## 2016-11-03 DIAGNOSIS — Z7902 Long term (current) use of antithrombotics/antiplatelets: Secondary | ICD-10-CM | POA: Insufficient documentation

## 2016-11-03 DIAGNOSIS — R739 Hyperglycemia, unspecified: Secondary | ICD-10-CM | POA: Diagnosis not present

## 2016-11-03 DIAGNOSIS — E669 Obesity, unspecified: Secondary | ICD-10-CM

## 2016-11-03 DIAGNOSIS — Z6829 Body mass index (BMI) 29.0-29.9, adult: Secondary | ICD-10-CM | POA: Diagnosis not present

## 2016-11-03 DIAGNOSIS — G459 Transient cerebral ischemic attack, unspecified: Secondary | ICD-10-CM

## 2016-11-03 DIAGNOSIS — Z7982 Long term (current) use of aspirin: Secondary | ICD-10-CM | POA: Insufficient documentation

## 2016-11-03 DIAGNOSIS — R4781 Slurred speech: Secondary | ICD-10-CM | POA: Diagnosis not present

## 2016-11-03 DIAGNOSIS — I10 Essential (primary) hypertension: Secondary | ICD-10-CM | POA: Insufficient documentation

## 2016-11-03 DIAGNOSIS — M170 Bilateral primary osteoarthritis of knee: Secondary | ICD-10-CM | POA: Diagnosis not present

## 2016-11-03 DIAGNOSIS — Z880 Allergy status to penicillin: Secondary | ICD-10-CM | POA: Diagnosis not present

## 2016-11-03 DIAGNOSIS — Z87891 Personal history of nicotine dependence: Secondary | ICD-10-CM | POA: Diagnosis not present

## 2016-11-03 DIAGNOSIS — I639 Cerebral infarction, unspecified: Secondary | ICD-10-CM

## 2016-11-03 DIAGNOSIS — K219 Gastro-esophageal reflux disease without esophagitis: Secondary | ICD-10-CM | POA: Diagnosis not present

## 2016-11-03 DIAGNOSIS — E785 Hyperlipidemia, unspecified: Secondary | ICD-10-CM

## 2016-11-03 DIAGNOSIS — Z8673 Personal history of transient ischemic attack (TIA), and cerebral infarction without residual deficits: Secondary | ICD-10-CM | POA: Diagnosis present

## 2016-11-03 DIAGNOSIS — R2681 Unsteadiness on feet: Secondary | ICD-10-CM

## 2016-11-03 HISTORY — DX: Transient cerebral ischemic attack, unspecified: G45.9

## 2016-11-03 LAB — GLUCOSE, CAPILLARY: Glucose-Capillary: 100 mg/dL — ABNORMAL HIGH (ref 65–99)

## 2016-11-03 LAB — CBC WITH DIFFERENTIAL/PLATELET
Basophils Absolute: 0 10*3/uL (ref 0–0.1)
Basophils Relative: 1 %
Eosinophils Absolute: 0.1 10*3/uL (ref 0–0.7)
Eosinophils Relative: 2 %
HEMATOCRIT: 42.6 % (ref 40.0–52.0)
HEMOGLOBIN: 15.1 g/dL (ref 13.0–18.0)
LYMPHS ABS: 2.5 10*3/uL (ref 1.0–3.6)
Lymphocytes Relative: 32 %
MCH: 31.5 pg (ref 26.0–34.0)
MCHC: 35.6 g/dL (ref 32.0–36.0)
MCV: 88.5 fL (ref 80.0–100.0)
MONO ABS: 0.6 10*3/uL (ref 0.2–1.0)
MONOS PCT: 8 %
NEUTROS ABS: 4.4 10*3/uL (ref 1.4–6.5)
NEUTROS PCT: 57 %
Platelets: 191 10*3/uL (ref 150–440)
RBC: 4.81 MIL/uL (ref 4.40–5.90)
RDW: 12.8 % (ref 11.5–14.5)
WBC: 7.7 10*3/uL (ref 3.8–10.6)

## 2016-11-03 LAB — COMPREHENSIVE METABOLIC PANEL
ALBUMIN: 4.5 g/dL (ref 3.5–5.0)
ALT: 15 U/L — AB (ref 17–63)
AST: 30 U/L (ref 15–41)
Alkaline Phosphatase: 133 U/L — ABNORMAL HIGH (ref 38–126)
Anion gap: 8 (ref 5–15)
BILIRUBIN TOTAL: 1.5 mg/dL — AB (ref 0.3–1.2)
BUN: 10 mg/dL (ref 6–20)
CHLORIDE: 105 mmol/L (ref 101–111)
CO2: 27 mmol/L (ref 22–32)
CREATININE: 0.86 mg/dL (ref 0.61–1.24)
Calcium: 9.2 mg/dL (ref 8.9–10.3)
GFR calc Af Amer: >60 mL/min (ref >60)
GFR calc non Af Amer: >60 mL/min (ref >60)
GLUCOSE: 105 mg/dL — AB (ref 65–99)
POTASSIUM: 3.6 mmol/L (ref 3.5–5.1)
Sodium: 140 mmol/L (ref 135–145)
Total Protein: 7.9 g/dL (ref 6.5–8.1)

## 2016-11-03 LAB — TROPONIN I: Troponin I: <0.03 ng/mL (ref <0.03)

## 2016-11-03 LAB — PROTIME-INR
INR: 0.94
PROTHROMBIN TIME: 12.6 s (ref 11.4–15.2)

## 2016-11-03 MED ORDER — OMEGA-3-ACID ETHYL ESTERS 1 G PO CAPS
2.0000 g | ORAL_CAPSULE | Freq: Every day | ORAL | Status: DC
  Administered 2016-11-03: 22:00:00 2 g via ORAL
  Filled 2016-11-03: qty 2

## 2016-11-03 MED ORDER — STROKE: EARLY STAGES OF RECOVERY BOOK
Freq: Once | Status: AC
  Administered 2016-11-03: 21:00:00

## 2016-11-03 MED ORDER — ASPIRIN EC 81 MG PO TBEC
81.0000 mg | DELAYED_RELEASE_TABLET | Freq: Every day | ORAL | Status: DC
  Filled 2016-11-03: qty 1

## 2016-11-03 MED ORDER — MULTIVITAMINS PO CAPS: 1.0000 | ORAL_CAPSULE | Freq: Every day | ORAL | Status: DC

## 2016-11-03 MED ORDER — ENOXAPARIN SODIUM 40 MG/0.4ML ~~LOC~~ SOLN
40.0000 mg | SUBCUTANEOUS | Status: DC
  Administered 2016-11-03: 40 mg via SUBCUTANEOUS
  Filled 2016-11-03: qty 0.4

## 2016-11-03 MED ORDER — ACETAMINOPHEN 650 MG RE SUPP: 650.0000 mg | RECTAL | Status: DC | PRN

## 2016-11-03 MED ORDER — ADULT MULTIVITAMIN W/MINERALS CH
1.0000 | ORAL_TABLET | Freq: Every day | ORAL | Status: DC
  Administered 2016-11-04: 1 via ORAL
  Filled 2016-11-03: qty 1

## 2016-11-03 MED ORDER — RISAQUAD PO CAPS
1.0000 | ORAL_CAPSULE | Freq: Every day | ORAL | Status: DC
  Administered 2016-11-04: 1 via ORAL
  Filled 2016-11-03: qty 1

## 2016-11-03 MED ORDER — FLUTICASONE PROPIONATE 50 MCG/ACT NA SUSP
2.0000 | Freq: Every day | NASAL | Status: DC
  Filled 2016-11-03: qty 16

## 2016-11-03 MED ORDER — PROBIOTIC DAILY PO CAPS: ORAL_CAPSULE | Freq: Every day | ORAL | Status: DC

## 2016-11-03 MED ORDER — ASPIRIN 81 MG PO CHEW
324.0000 mg | CHEWABLE_TABLET | Freq: Once | ORAL | Status: AC
  Administered 2016-11-03: 324 mg via ORAL
  Filled 2016-11-03: qty 4

## 2016-11-03 MED ORDER — ACETAMINOPHEN 325 MG PO TABS: 650.0000 mg | ORAL_TABLET | ORAL | Status: DC | PRN

## 2016-11-03 MED ORDER — ACETAMINOPHEN 160 MG/5ML PO SOLN
650.0000 mg | ORAL | Status: DC | PRN
  Filled 2016-11-03: qty 20.3

## 2016-11-03 NOTE — ED Notes (Signed)
Emergency line called

## 2016-11-03 NOTE — ED Provider Notes (Signed)
[facility] Emergency Department Provider Note        Time seen: ----------------------------------------- 6:38 PM on 11/03/2016 -----------------------------------------    I have reviewed the triage vital signs and the nursing notes.   HISTORY  Chief Complaint Code Stroke    HPI James Compton is a 65 y.o. male who presents to the ER for 3 episodes of sudden onset dizziness with some right-sided weakness where it felt like his right arm was less coordinated and he had difficulty tapping his right foot. Patient had slurred speech and preceding symptoms at around 1:30 today that lasted about 3-5 minutes. He denies any recent illness or history of these complaints.Patient is had complete resolution of symptoms currently.   Past Medical History:  Diagnosis Date  . Arthritis    Osteo - knees  . Complication of anesthesia    bowels "feel asleep" after knee surgery  . GERD (gastroesophageal reflux disease)    occas  . Vertigo 01/2011  . Wears hearing aid    bilateral  . Wears partial dentures    upper    Patient Active Problem List   Diagnosis Date Noted  . Aortic ectasia, abdominal ([place]) 10/21/2016  . Encounter for hearing examination after failed hearing test 10/15/2016    Past Surgical History:  Procedure Laterality Date  . CHOLECYSTECTOMY  2007  . COLONOSCOPY N/A 04/14/2015   Procedure: COLONOSCOPY;  Surgeon: Lucilla Lame, MD;  Location: [place];  Service: Gastroenterology;  Laterality: N/A;  . KNEE ARTHROSCOPY Right 2007  . POLYPECTOMY  04/14/2015   Procedure: POLYPECTOMY INTESTINAL;  Surgeon: Lucilla Lame, MD;  Location: [place];  Service: Gastroenterology;;    Allergies Penicillins  Social History Social History  Substance Use Topics  . Smoking status: Former Smoker    Quit date: 11/25/1970  . Smokeless tobacco: Never Used  . Alcohol use No    Review of Systems Constitutional: Negative for  fever. Cardiovascular: Negative for chest pain. Respiratory: Negative for shortness of breath. Gastrointestinal: Negative for abdominal pain, vomiting and diarrhea. Genitourinary: Negative for dysuria. Musculoskeletal: Negative for back pain. Skin: Negative for rash. Neurological: Positive for right-sided weakness, slurred speech and dizziness  10-point ROS otherwise negative.  ____________________________________________   PHYSICAL EXAM:  VITAL SIGNS: ED Triage Vitals  Enc Vitals Group     BP 11/03/16 1805 (!) 193/76     Pulse Rate 11/03/16 1805 64     Resp 11/03/16 1805 18     Temp 11/03/16 1805 98.1 F (36.7 C)     Temp Source 11/03/16 1805 Oral     SpO2 11/03/16 1805 98 %     Weight 11/03/16 1806 196 lb (88.9 kg)     Height 11/03/16 1806 5\' 9"  (1.753 m)     Head Circumference --      Peak Flow --      Pain Score --      Pain Loc --      Pain Edu? --      Excl. in [place]? --     Constitutional: Alert and oriented. Well appearing and in no distress. Eyes: Conjunctivae are normal. PERRL. Normal extraocular movements. ENT   Head: Normocephalic and atraumatic.   Nose: No congestion/rhinnorhea.   Mouth/Throat: Mucous membranes are moist.   Neck: No stridor. Cardiovascular: Normal rate, regular rhythm. No murmurs, rubs, or gallops. Respiratory: Normal respiratory effort without tachypnea nor retractions. Breath sounds are clear and equal bilaterally. No wheezes/rales/rhonchi. Gastrointestinal: Soft and nontender. Normal bowel  sounds Musculoskeletal: Nontender with normal range of motion in all extremities. No lower extremity tenderness nor edema. Neurologic:  Normal speech and language. No gross focal neurologic deficits are appreciated. Strength, sensation, cranial nerves are normal. Skin:  Skin is warm, dry and intact. No rash noted. Psychiatric: Mood and affect are normal. Speech and behavior are normal.  ____________________________________________  EKG:  Interpreted by me.Sinus rhythm rate of 67 bpm, normal PR interval, normal QRS, normal QT.  ____________________________________________  ED COURSE:  Pertinent labs & imaging results that were available during my care of the patient were reviewed by me and considered in my medical decision making (see chart for details). Clinical Course   Patient presents to the ER in no distress, likely TIAs today. We will assess with labs and imaging.  Procedures ____________________________________________   LABS (pertinent positives/negatives)  Labs Reviewed  COMPREHENSIVE METABOLIC PANEL - Abnormal; Notable for the following:       Result Value   Glucose, Bld 105 (*)    ALT 15 (*)    Alkaline Phosphatase 133 (*)    Total Bilirubin 1.5 (*)    All other components within normal limits  GLUCOSE, CAPILLARY - Abnormal; Notable for the following:    Glucose-Capillary 100 (*)    All other components within normal limits  PROTIME-INR  TROPONIN I  APTT  CBC WITH DIFFERENTIAL/PLATELET  CBG MONITORING, ED    RADIOLOGY  CT head is unremarkable  ____________________________________________  FINAL ASSESSMENT AND PLAN  TIA  Plan: Patient with labs and imaging as dictated above. Patient is in no distress, no further neurologic deficits. Patient apparently had 3 episodes of transient ischemia. We have started him on aspirin. I will discuss with the hospitalist for admission.   Earleen Newport, MD   Note: This dictation was prepared with Dragon dictation. Any transcriptional errors that result from this process are unintentional    Earleen Newport, MD 11/03/16 [phone]

## 2016-11-03 NOTE — ED Notes (Addendum)
At 1330 pt started having symptom of weakness on right side - he was at church and attempted to sing and he was unable to speak plainly - he had difficulty gathering words to use - since 1330 he has had 3 episodes (lasting approx 3-5 minutes) - when driving home from church around 1530 he began to have difficulty speaking again - the at 1730 he became unable to tap foot or scroll cell phone - c/o headache at 1630 - pt reports that when he first got up this morning at 630am he had a severe pain in the left side of head that resolved and he reports not having any difficulty at that time

## 2016-11-03 NOTE — ED Notes (Signed)
IV attempted by Anda Kraft RN x3 without success

## 2016-11-03 NOTE — H&P (Signed)
[facility] [place] NAME: James Compton    MR#:  [id number]  DATE OF BIRTH:  1951/05/28  DATE OF ADMISSION:  11/03/2016  PRIMARY CARE PHYSICIAN: Lelon Huh, MD   REQUESTING/REFERRING PHYSICIAN: Dr Jimmye Norman  CHIEF COMPLAINT:   Difficulty with speech and right-sided weakness HISTORY OF PRESENT ILLNESS:  James Compton  is a 66 y.o. male with a known history of Vertigo, blurred, arthritis comes to the emergency room after he had expressive aphasia 3 which resolved every time along with right-sided weakness more in the right upper extremity than lower extremity. He had also difficulty tapping his right foot when he tried to do so came to the emergency room and workup in the ER showed CT negative for stroke. He received 4 baby aspirins. Patient currently symptom-free. He is being admitted for TIA. Patient denies any symptoms at present. Denies any headache dysphagia dysarthria or focal weakness at present  PAST MEDICAL HISTORY:   Past Medical History:  Diagnosis Date  . Arthritis    Osteo - knees  . Complication of anesthesia    bowels "feel asleep" after knee surgery  . GERD (gastroesophageal reflux disease)    occas  . Vertigo 01/2011  . Wears hearing aid    bilateral  . Wears partial dentures    upper    PAST SURGICAL HISTOIRY:   Past Surgical History:  Procedure Laterality Date  . CHOLECYSTECTOMY  2007  . COLONOSCOPY N/A 04/14/2015   Procedure: COLONOSCOPY;  Surgeon: Lucilla Lame, MD;  Location: [place];  Service: Gastroenterology;  Laterality: N/A;  . KNEE ARTHROSCOPY Right 2007  . POLYPECTOMY  04/14/2015   Procedure: POLYPECTOMY INTESTINAL;  Surgeon: Lucilla Lame, MD;  Location: [place];  Service: Gastroenterology;;    SOCIAL HISTORY:   Social History  Substance Use Topics  . Smoking status: Former Smoker    Quit date: 11/25/1970  . Smokeless tobacco: Never Used  . Alcohol use No    FAMILY  HISTORY:   Family History  Problem Relation Age of Onset  . Alzheimer's disease Mother   . Diabetes Father     pre diabetic  . Healthy Sister   . Healthy Brother   . Healthy Daughter   . Healthy Son   . Healthy Daughter     DRUG ALLERGIES:   Allergies  Allergen Reactions  . Penicillins Nausea Only    REVIEW OF SYSTEMS:  Review of Systems  Constitutional: Negative for chills, fever and weight loss.  HENT: Negative for ear discharge, ear pain and nosebleeds.   Eyes: Negative for blurred vision, pain and discharge.  Respiratory: Negative for sputum production, shortness of breath, wheezing and stridor.   Cardiovascular: Negative for chest pain, palpitations, orthopnea and PND.  Gastrointestinal: Negative for abdominal pain, diarrhea, nausea and vomiting.  Genitourinary: Negative for frequency and urgency.  Musculoskeletal: Negative for back pain and joint pain.  Neurological: Positive for speech change and weakness. Negative for sensory change and focal weakness.  Psychiatric/Behavioral: Negative for depression and hallucinations. The patient is not nervous/anxious.      MEDICATIONS AT HOME:   Prior to Admission medications   Medication Sig Start Date End Date Taking? Authorizing Provider  aspirin 81 MG tablet Take 81 mg by mouth daily. PM    Historical Provider, MD  fluticasone (FLONASE) 50 MCG/ACT nasal spray Place 2 sprays into both nostrils daily.     Historical Provider, MD  Multiple Vitamin (MULTIVITAMIN) capsule Take  1 capsule by mouth daily. PM    Historical Provider, MD  Omega-3 Fatty Acids (FISH OIL) 1000 MG CAPS Take 2 capsules by mouth daily. PM    Historical Provider, MD  Probiotic Product (PROBIOTIC DAILY PO) Take 1 capsule by mouth daily. PM    Historical Provider, MD      VITAL SIGNS:  Blood pressure (!) 157/94, pulse 60, temperature 97.8 F (36.6 C), resp. rate 18, height 5\' 9"  (1.753 m), weight 88.9 kg (196 lb), SpO2 98 %.  PHYSICAL EXAMINATION:   GENERAL:  65 y.o.-year-old patient lying in the bed with no acute distress.  EYES: Pupils equal, round, reactive to light and accommodation. No scleral icterus. Extraocular muscles intact.  HEENT: Head atraumatic, normocephalic. Oropharynx and nasopharynx clear.  NECK:  Supple, no jugular venous distention. No thyroid enlargement, no tenderness.  LUNGS: Normal breath sounds bilaterally, no wheezing, rales,rhonchi or crepitation. No use of accessory muscles of respiration.  CARDIOVASCULAR: S1, S2 normal. No murmurs, rubs, or gallops.  ABDOMEN: Soft, nontender, nondistended. Bowel sounds present. No organomegaly or mass.  EXTREMITIES: No pedal edema, cyanosis, or clubbing.  NEUROLOGIC: Cranial nerves II through XII are intact. Muscle strength 5/5 in all extremities. Sensation intact. Gait not checked.  PSYCHIATRIC: The patient is alert and oriented x 3.  SKIN: No obvious rash, lesion, or ulcer.   LABORATORY PANEL:   CBC  Recent Labs Lab 11/03/16 1930  WBC 7.7  HGB 15.1  HCT 42.6  PLT 191   ------------------------------------------------------------------------------------------------------------------  Chemistries   Recent Labs Lab 11/03/16 1841  NA 140  K 3.6  CL 105  CO2 27  GLUCOSE 105*  BUN 10  CREATININE 0.86  CALCIUM 9.2  AST 30  ALT 15*  ALKPHOS 133*  BILITOT 1.5*   ------------------------------------------------------------------------------------------------------------------  Cardiac Enzymes  Recent Labs Lab 11/03/16 1841  TROPONINI <0.03   ------------------------------------------------------------------------------------------------------------------  RADIOLOGY:  Ct Head Code Stroke W/o Cm  Result Date: 11/03/2016 CLINICAL DATA:  Code stroke. Right-sided weakness and slurred speech. EXAM: CT HEAD WITHOUT CONTRAST TECHNIQUE: Contiguous axial images were obtained from the base of the skull through the vertex without intravenous contrast.  COMPARISON:  01/30/2012 FINDINGS: Brain: There is no evidence of acute cortical infarct, intracranial hemorrhage, mass, midline shift, or extra-axial fluid collection. The ventricles and sulci are normal. Vascular: Mild calcified atherosclerosis at the skullbase. No hyperdense vessel. Skull: No fracture or focal osseous lesion. Sinuses/Orbits: Visualized paranasal sinuses and mastoid air cells are clear. Visualized orbits are unremarkable. Other: None. ASPECTS [facility] Stroke Program Early CT Score) - Ganglionic level infarction (caudate, lentiform nuclei, internal capsule, insula, M1-M3 cortex): 7 - Supraganglionic infarction (M4-M6 cortex): 3 Total score (0-10 with 10 being normal): 10 IMPRESSION: 1. No evidence of acute intracranial abnormality. 2. ASPECTS is 10. These results were called by telephone at the time of interpretation on 11/03/2016 at 6:25 pm to Dr. Charlotte Crumb , who verbally acknowledged these results. Electronically Signed   By: Logan Bores M.D.   On: 11/03/2016 18:26    EKG:  Normal sinus rhythm  IMPRESSION AND PLAN:   James Compton  is a 66 y.o. male with a known history of Vertigo, blurred, arthritis comes to the emergency room after he had expressive aphasia 3 which resolved every time along with right-sided weakness more in the right upper extremity than lower extremity. He had also difficulty tapping his right foot when he tried to do so came to the emergency room and workup in the ER showed CT negative  for stroke  1. TIA Patient presented with expressive aphasia 3 episodes with right-sided weakness which resolved in the emergency room. Admit to medical floor Aspirin 81 mg daily Check lipid profile MRI, echo, ultrasound carotid Doppler Neuro consultation  2. Elevated blood pressure without diagnosis of high blood pressure -Allow permissive hypertension -If remains persistently elevated consider starting antihypertensives  3. GERD continue PPI  4. Lovenox for DVT  prophylaxis  Above was discussed with patient and patient's family members in the ER  All the records are reviewed and case discussed with ED provider. Management plans discussed with the patient, family and they are in agreement.  CODE STATUS: Full  TOTAL TIME TAKING CARE OF THIS PATIENT: 50 minutes.    Bruna Dills M.D on 11/03/2016 at 8:42 PM  Between 7am to 6pm - Pager - [phone]  After 6pm go to [email or web address] - password [email or web address] [facility]  Office  [phone]  CC: Primary care physician; Lelon Huh, MD

## 2016-11-03 NOTE — ED Notes (Signed)
Attempted IV x2 - pt tolerated well but both sites blew (right AC and left hand)

## 2016-11-03 NOTE — Care Management Obs Status (Signed)
[place] NOTIFICATION   Patient Details  Name: MALAKIA HI MRN: [id number] Date of Birth: [birth year]   Medicare Observation Status Notification Given:  Yes    CrutchfieldAntony Haste, RN 11/03/2016, 8:25 PM

## 2016-11-03 NOTE — ED Triage Notes (Signed)
Pt states he has had 3 episodes of sudden onset dizziness, right sided weakness, and slurred speech first occurring at 1330 today, states the sx last about 3-5 min and resolve.James Compton

## 2016-11-04 ENCOUNTER — Observation Stay: Admit: 2016-11-04 | Payer: Medicare Other

## 2016-11-04 ENCOUNTER — Observation Stay: Payer: Medicare Other

## 2016-11-04 ENCOUNTER — Telehealth: Payer: Self-pay | Admitting: Family Medicine

## 2016-11-04 DIAGNOSIS — R531 Weakness: Secondary | ICD-10-CM | POA: Diagnosis not present

## 2016-11-04 DIAGNOSIS — I10 Essential (primary) hypertension: Secondary | ICD-10-CM | POA: Diagnosis not present

## 2016-11-04 DIAGNOSIS — E785 Hyperlipidemia, unspecified: Secondary | ICD-10-CM

## 2016-11-04 DIAGNOSIS — E669 Obesity, unspecified: Secondary | ICD-10-CM

## 2016-11-04 DIAGNOSIS — G459 Transient cerebral ischemic attack, unspecified: Secondary | ICD-10-CM | POA: Diagnosis not present

## 2016-11-04 DIAGNOSIS — I6523 Occlusion and stenosis of bilateral carotid arteries: Secondary | ICD-10-CM | POA: Diagnosis not present

## 2016-11-04 DIAGNOSIS — R739 Hyperglycemia, unspecified: Secondary | ICD-10-CM | POA: Diagnosis not present

## 2016-11-04 DIAGNOSIS — G451 Carotid artery syndrome (hemispheric): Secondary | ICD-10-CM

## 2016-11-04 LAB — LIPID PANEL
CHOLESTEROL: 164 mg/dL (ref 0–200)
HDL: 37 mg/dL — ABNORMAL LOW (ref >40)
LDL CALC: 89 mg/dL (ref 0–99)
Total CHOL/HDL Ratio: 4.4 RATIO
Triglycerides: 190 mg/dL — ABNORMAL HIGH (ref <150)
VLDL: 38 mg/dL (ref 0–40)

## 2016-11-04 LAB — TSH: TSH: 2.219 u[IU]/mL (ref 0.350–4.500)

## 2016-11-04 LAB — APTT: aPTT: 33 seconds (ref 24–36)

## 2016-11-04 MED ORDER — ATORVASTATIN CALCIUM 20 MG PO TABS: 40.0000 mg | ORAL_TABLET | Freq: Every day | ORAL | Status: DC

## 2016-11-04 MED ORDER — CLOPIDOGREL BISULFATE 75 MG PO TABS: 75.0000 mg | ORAL_TABLET | Freq: Every day | ORAL | 5 refills | Status: DC

## 2016-11-04 MED ORDER — ASPIRIN EC 325 MG PO TBEC
325.0000 mg | DELAYED_RELEASE_TABLET | Freq: Every day | ORAL | Status: DC
  Administered 2016-11-04: 11:00:00 325 mg via ORAL
  Filled 2016-11-04: qty 1

## 2016-11-04 MED ORDER — ATORVASTATIN CALCIUM 40 MG PO TABS: 40.0000 mg | ORAL_TABLET | Freq: Every day | ORAL | 5 refills | Status: DC

## 2016-11-04 MED ORDER — LOSARTAN POTASSIUM 25 MG PO TABS: 25.0000 mg | ORAL_TABLET | Freq: Every day | ORAL | Status: DC

## 2016-11-04 MED ORDER — LOSARTAN POTASSIUM 25 MG PO TABS: 25.0000 mg | ORAL_TABLET | Freq: Every day | ORAL | 6 refills | Status: DC

## 2016-11-04 NOTE — Consult Note (Signed)
Referring Physician: Ether Griffins    Chief Complaint: Episodes of difficulty with speech  HPI: James Compton is an 65 y.o. male with a history of vertigo who reports that on yesterday he has three episodes of difficulty with speech.  He knew what he wanted to say but the right words would not come out.  With the first and last episode he had some difficulty controlling his RUE as well.  Patient has been back to baseline since about 1730 yesterday.  Episodes lasted a few minutes each.  Initial NIHSS of 0.    Date last known well: Date: 11/03/2016 Time last known well: Time: 17:30 tPA Given: No: Resolution of symptoms  Past Medical History:  Diagnosis Date  . Arthritis    Osteo - knees  . Complication of anesthesia    bowels "feel asleep" after knee surgery  . GERD (gastroesophageal reflux disease)    occas  . Vertigo 01/2011  . Wears hearing aid    bilateral  . Wears partial dentures    upper    Past Surgical History:  Procedure Laterality Date  . CHOLECYSTECTOMY  2007  . COLONOSCOPY N/A 04/14/2015   Procedure: COLONOSCOPY;  Surgeon: Lucilla Lame, MD;  Location: [facility];  Service: Gastroenterology;  Laterality: N/A;  . KNEE ARTHROSCOPY Right 2007  . POLYPECTOMY  04/14/2015   Procedure: POLYPECTOMY INTESTINAL;  Surgeon: Lucilla Lame, MD;  Location: [facility];  Service: Gastroenterology;;    Family History  Problem Relation Age of Onset  . Alzheimer's disease Mother   . Diabetes Father     pre diabetic  . Healthy Sister   . Healthy Brother   . Healthy Daughter   . Healthy Son   . Healthy Daughter    Social History:  reports that he quit smoking about 45 years ago. He has never used smokeless tobacco. He reports that he does not drink alcohol. His drug history is not on file.  Allergies:  Allergies  Allergen Reactions  . Penicillins Nausea Only    Medications:  I have reviewed the patient's current medications. Prior to Admission:  Prescriptions Prior  to Admission  Medication Sig Dispense Refill Last Dose  . aspirin 81 MG tablet Take 81 mg by mouth daily. PM   11/02/2016 at 0900  . Multiple Vitamin (MULTIVITAMIN) capsule Take 1 capsule by mouth daily. PM   11/02/2016 at 0900   . Omega-3 Fatty Acids (FISH OIL) 1000 MG CAPS Take 2 capsules by mouth daily. PM   11/03/2016 at 2200  . Probiotic Product (PROBIOTIC DAILY PO) Take 1 capsule by mouth daily. PM   11/02/2016 at 2100  . fluticasone (FLONASE) 50 MCG/ACT nasal spray Place 2 sprays into both nostrils daily.    Taking   Scheduled: . acidophilus  1 capsule Oral Daily  . aspirin EC  325 mg Oral Daily  . atorvastatin  40 mg Oral q1800  . enoxaparin (LOVENOX) injection  40 mg Subcutaneous Q24H  . fluticasone  2 spray Each Nare Daily  . losartan  25 mg Oral Daily  . multivitamin with minerals  1 tablet Oral Daily  . omega-3 acid ethyl esters  2 g Oral Daily    ROS: History obtained from the patient  General ROS: negative for - chills, fatigue, fever, night sweats, weight gain or weight loss Psychological ROS: negative for - behavioral disorder, hallucinations, memory difficulties, mood swings or suicidal ideation Ophthalmic ROS: negative for - blurry vision, double vision, eye pain or loss  of vision ENT ROS: negative for - epistaxis, nasal discharge, oral lesions, sore throat, tinnitus or vertigo Allergy and Immunology ROS: negative for - hives or itchy/watery eyes Hematological and Lymphatic ROS: negative for - bleeding problems, bruising or swollen lymph nodes Endocrine ROS: negative for - galactorrhea, hair pattern changes, polydipsia/polyuria or temperature intolerance Respiratory ROS: negative for - cough, hemoptysis, shortness of breath or wheezing Cardiovascular ROS: negative for - chest pain, dyspnea on exertion, edema or irregular heartbeat Gastrointestinal ROS: negative for - abdominal pain, diarrhea, hematemesis, nausea/vomiting or stool incontinence Genito-Urinary ROS:  negative for - dysuria, hematuria, incontinence or urinary frequency/urgency Musculoskeletal ROS: negative for - joint swelling or muscular weakness Neurological ROS: as noted in HPI Dermatological ROS: negative for rash and skin lesion changes  Physical Examination: Blood pressure (!) 173/95, pulse 88, temperature 98.6 F (37 C), resp. rate 20, height 5\' 9"  (1.753 m), weight 89.1 kg (196 lb 8 oz), SpO2 (!) 86 %.  HEENT-  Normocephalic, no lesions, without obvious abnormality.  Normal external eye and conjunctiva.  Normal TM's bilaterally.  Normal auditory canals and external ears. Normal external nose, mucus membranes and septum.  Normal pharynx. Cardiovascular- S1, S2 normal, pulses palpable throughout   Lungs- chest clear, no wheezing, rales, normal symmetric air entry Abdomen- soft, non-tender; bowel sounds normal; no masses,  no organomegaly Extremities- no edema Lymph-no adenopathy palpable Musculoskeletal-no joint tenderness, deformity or swelling Skin-warm and dry, no hyperpigmentation, vitiligo, or suspicious lesions  Neurological Examination Mental Status: Alert, oriented, thought content appropriate.  Speech fluent without evidence of aphasia.  Able to follow 3 step commands without difficulty. Cranial Nerves: II: Discs flat bilaterally; Visual fields grossly normal, pupils equal, round, reactive to light and accommodation III,IV, VI: ptosis not present, extra-ocular motions intact bilaterally V,VII: smile symmetric, facial light touch sensation normal bilaterally VIII: hearing normal bilaterally IX,X: gag reflex present XI: bilateral shoulder shrug XII: midline tongue extension Motor: Right : Upper extremity   5/5    Left:     Upper extremity   5/5  Lower extremity   5/5     Lower extremity   5/5 Tone and bulk:normal tone throughout; no atrophy noted Sensory: Pinprick and light touch intact throughout, bilaterally Deep Tendon Reflexes: 2+ and symmetric  throughout Plantars: Right: downgoing   Left: downgoing Cerebellar: normal finger-to-nose, normal rapid alternating movements and normal heel-to-shin test Gait: normal gait and station      Laboratory Studies:  Basic Metabolic Panel:  Recent Labs Lab 11/03/16 1841  NA 140  K 3.6  CL 105  CO2 27  GLUCOSE 105*  BUN 10  CREATININE 0.86  CALCIUM 9.2    Liver Function Tests:  Recent Labs Lab 11/03/16 1841  AST 30  ALT 15*  ALKPHOS 133*  BILITOT 1.5*  PROT 7.9  ALBUMIN 4.5   No results for input(s): LIPASE, AMYLASE in the last 168 hours. No results for input(s): AMMONIA in the last 168 hours.  CBC:  Recent Labs Lab 11/03/16 1930  WBC 7.7  NEUTROABS 4.4  HGB 15.1  HCT 42.6  MCV 88.5  PLT 191    Cardiac Enzymes:  Recent Labs Lab 11/03/16 1841  TROPONINI <0.03    BNP: Invalid input(s): POCBNP  CBG:  Recent Labs Lab 11/03/16 1850  GLUCAP 100*    Microbiology: No results found for this or any previous visit.  Coagulation Studies:  Recent Labs  11/03/16 1841  LABPROT 12.6  INR 0.94    Urinalysis: No results for input(s):  COLORURINE, LABSPEC, [place], GLUCOSEU, HGBUR, BILIRUBINUR, KETONESUR, PROTEINUR, UROBILINOGEN, NITRITE, LEUKOCYTESUR in the last 168 hours.  Invalid input(s): APPERANCEUR  Lipid Panel:    Component Value Date/Time   CHOL 164 11/04/2016 0351   CHOL 190 10/21/2016 0807   TRIG 190 (H) 11/04/2016 0351   HDL 37 (L) 11/04/2016 0351   HDL 42 10/21/2016 0807   CHOLHDL 4.4 11/04/2016 0351   VLDL 38 11/04/2016 0351   LDLCALC 89 11/04/2016 0351   LDLCALC 120 (H) 10/21/2016 0807    HgbA1C:  Lab Results  Component Value Date   HGBA1C 5.2 11/28/2014    Urine Drug Screen:  No results found for: LABOPIA, COCAINSCRNUR, LABBENZ, AMPHETMU, THCU, LABBARB  Alcohol Level: No results for input(s): ETH in the last 168 hours.  Other results: EKG: sinus rhythm at 67 bpm.  Imaging: Mr Brain Wo Contrast  Result Date:  11/04/2016 CLINICAL DATA:  66 year old male with recent episodes of right side weakness and expressive aphasia, now symptom free. Initial encounter. EXAM: MRI HEAD WITHOUT CONTRAST TECHNIQUE: Multiplanar, multiecho pulse sequences of the brain and surrounding structures were obtained without intravenous contrast. COMPARISON:  Head CT without contrast 11/03/2016 and earlier. FINDINGS: Brain: Cerebral volume is within normal limits for age. No restricted diffusion to suggest acute infarction. No midline shift, mass effect, evidence of mass lesion, ventriculomegaly, extra-axial collection or acute intracranial hemorrhage. Cervicomedullary junction and pituitary are within normal limits. [place] Cables and white matter signal is within normal limits for age throughout the brain. No cortical encephalomalacia or chronic cerebral blood products. Vascular: Major intracranial vascular flow voids are preserved. There is mild intracranial artery ectasia, primarily affecting the posterior circulation. Skull and upper cervical spine: Negative. Visualized bone marrow signal is within normal limits. Sinuses/Orbits: Normal orbits soft tissues. Trace left maxillary sinus mucosal thickening. Other: Visible internal auditory structures appear normal. Trace right mastoid fluid. Negative scalp soft tissues. IMPRESSION: Normal for age noncontrast MRI appearance of the brain. Electronically Signed   By: Genevie Ann M.D.   On: 11/04/2016 09:28   US Carotid Bilateral (at [facility] And [facility] Only)  Result Date: 11/04/2016 CLINICAL DATA:  Right-sided weakness and slurred speech. EXAM: BILATERAL CAROTID DUPLEX ULTRASOUND TECHNIQUE: [place] Cables scale imaging, color Doppler and duplex ultrasound were performed of bilateral carotid and vertebral arteries in the neck. COMPARISON:  Brain MRI 11/04/2016 FINDINGS: Criteria: Quantification of carotid stenosis is based on velocity parameters that correlate the residual internal carotid diameter with NASCET-based stenosis  levels, using the diameter of the distal internal carotid lumen as the denominator for stenosis measurement. The following velocity measurements were obtained: RIGHT ICA:  97 cm/sec CCA:  123456 cm/sec SYSTOLIC ICA/CCA RATIO:  0.8 DIASTOLIC ICA/CCA RATIO:  1.1 ECA:  159 cm/sec LEFT ICA:  141 cm/sec CCA:  89 cm/sec SYSTOLIC ICA/CCA RATIO:  1.6 DIASTOLIC ICA/CCA RATIO:  2.4 ECA:  130 cm/sec RIGHT CAROTID ARTERY: Intimal thickening and mild plaque in the distal common carotid artery and carotid bulb region. Minimal plaque in the proximal external carotid artery. External carotid artery is patent with normal waveform. Small amount of heterogeneous plaque in the proximal internal carotid artery. Normal waveforms and velocities in the internal carotid artery. RIGHT VERTEBRAL ARTERY: Antegrade flow and normal waveform in the right vertebral artery. LEFT CAROTID ARTERY: Small amount of calcified plaque near the origin of the left external carotid artery. External carotid artery is patent with normal waveform. Small amount of plaque in the proximal internal carotid artery. The distal internal carotid artery peak systolic velocity  is mildly elevated measuring up to 141 cm/sec but this segment is torturous. Peak systolic velocity carotid artery ratios in left internal carotid artery are less than 2. LEFT VERTEBRAL ARTERY: Antegrade flow and normal waveform in the left vertebral artery. IMPRESSION: Mild atherosclerotic disease in the carotid arteries bilaterally. Peak systolic velocity in the distal left internal carotid artery is mildly elevated but this is probably related to tortuosity. Estimated degree of stenosis in the internal carotid arteries is less than 50% bilaterally. Patent vertebral arteries. Electronically Signed   By: Markus Daft M.D.   On: 11/04/2016 10:55   Ct Head Code Stroke W/o Cm  Result Date: 11/03/2016 CLINICAL DATA:  Code stroke. Right-sided weakness and slurred speech. EXAM: CT HEAD WITHOUT CONTRAST  TECHNIQUE: Contiguous axial images were obtained from the base of the skull through the vertex without intravenous contrast. COMPARISON:  01/30/2012 FINDINGS: Brain: There is no evidence of acute cortical infarct, intracranial hemorrhage, mass, midline shift, or extra-axial fluid collection. The ventricles and sulci are normal. Vascular: Mild calcified atherosclerosis at the skullbase. No hyperdense vessel. Skull: No fracture or focal osseous lesion. Sinuses/Orbits: Visualized paranasal sinuses and mastoid air cells are clear. Visualized orbits are unremarkable. Other: None. ASPECTS [facility] Stroke Program Early CT Score) - Ganglionic level infarction (caudate, lentiform nuclei, internal capsule, insula, M1-M3 cortex): 7 - Supraganglionic infarction (M4-M6 cortex): 3 Total score (0-10 with 10 being normal): 10 IMPRESSION: 1. No evidence of acute intracranial abnormality. 2. ASPECTS is 10. These results were called by telephone at the time of interpretation on 11/03/2016 at 6:25 pm to Dr. Charlotte Crumb , who verbally acknowledged these results. Electronically Signed   By: Logan Bores M.D.   On: 11/03/2016 18:26    Assessment: 65 y.o. male presenting with episodes of difficulty with speech and RUE weakness.  Symptoms have resolved.  Patient on ASA at home.  MRI of the brain reviewed and shows no acute changes.  Carotid dopplers show no evidence of hemodynamically significant stenosis.  Echocardiogram pending.  A1c pending, LDL 89.  Stroke Risk Factors - hyperlipidemia  Plan: 1. PT consult, OT consult, Speech consult 2. Prophylactic therapy-ASA 81mg  and Plavix 75mg  daily 3. Telemetry monitoring 4. Frequent neuro checks 5. Statin for lipid management with target LDL<70.    Alexis Goodell, MD Neurology [phone] 11/04/2016, 11:16 AM

## 2016-11-04 NOTE — Telephone Encounter (Signed)
Pt was discharged from [facility] today for a stroke.  O have scheduled a hospital follow up appointment/MW

## 2016-11-04 NOTE — Discharge Summary (Signed)
The patient is a 65 year old Caucasian male with past medical history significant for history of [place] [facility] [place] NAME: James Compton    MR#:  [id number]  DATE OF BIRTH:  1951-04-22  DATE OF ADMISSION:  11/03/2016 ADMITTING PHYSICIAN: Fritzi Mandes, MD  DATE OF DISCHARGE: No discharge date for patient encounter.  PRIMARY CARE PHYSICIAN: Lelon Huh, MD     ADMISSION DIAGNOSIS:  CVA (cerebral vascular accident) ([place]) [I63.9] Transient cerebral ischemia, unspecified type [G45.9]  DISCHARGE DIAGNOSIS:  Principal Problem:   TIA (transient ischemic attack) Active Problems:   Essential hypertension   Hyperlipidemia   Obesity   Hyperglycemia   SECONDARY DIAGNOSIS:   Past Medical History:  Diagnosis Date  . Arthritis    Osteo - knees  . Complication of anesthesia    bowels "feel asleep" after knee surgery  . GERD (gastroesophageal reflux disease)    occas  . Vertigo 01/2011  . Wears hearing aid    bilateral  . Wears partial dentures    upper    .pro HOSPITAL COURSE:   The patient is a 65 year old Caucasian male with past medical history significant for history of vertigo, arthritis, who presents to the hospital with complaints of expressive aphasia, right-sided weakness, right upper extremity more than the right lower extremity. CT scan of head showed no abnormalities. He was admitted for TIA workup. The patient underwent MRI of the brain, revealing no acute stroke, carotid ultrasound, revealing internal carotid artery stenosis less than 50%, echocardiogram was ordered, not performed due to time constraints. Hemoglobin A1c was ordered, pending, TSH was normal at 2.2, PSA was normal at 1.0. Initial lipid panel revealed LDL of 120, triglycerides of 138. Total cholesterol level of 190. Advised to be continued on Lipitor, aspirin, Plavix therapy by neurologist, Dr. Doy Mince, who saw patient in consultation. The patient was seen by  physical therapist who did not recommend no physical therapy follow-up. It was felt that patient is stable to be discharged to home with primary care physician's follow-up as outpatient.  Echocardiogram was recommended to be performed as outpatient. Discussion by problem: #1. TIA with difficulty of speech in the right upper extremity weakness, symptoms have resolved, continue patient on aspirin and Plavix. According to Dr. Doy Mince, neurologist recommendations. Echocardiogram could not be performed due to time restraints. Patient remained in sinus rhythm on telemetry. Carotid ultrasound revealed no evidence of hemodynamically significant stenosis. Hemoglobin A1c was ordered, pending. LDL was 89, patient was initiated on Lipitor , recommended LDL target of less than 70. The patient may benefit from outpatient prolonged cardiac monitoring #2. Essential hypertension, patient is initiated on Cozaar, it is recommended to advance this medication to control his blood pressure as needed, patient was counseled about salt intake, weight loss #3. Hyperlipidemia with LDL of 89, patient is to continue fish oil, initiated on Lipitor, LDL target was less than 70, according to neurologist, Dr. Doy Mince, patient was counseled about weight loss #4. Hyperglycemia, hemoglobin A1c is pending  DISCHARGE CONDITIONS:   Stable  CONSULTS OBTAINED:  Treatment Team:  Alexis Goodell, MD  DRUG ALLERGIES:   Allergies  Allergen Reactions  . Penicillins Nausea Only    DISCHARGE MEDICATIONS:   Current Discharge Medication List    START taking these medications   Details  atorvastatin (LIPITOR) 40 MG tablet Take 1 tablet (40 mg total) by mouth daily at 6 PM. Qty: 30 tablet, Refills: 5    clopidogrel (PLAVIX) 75 MG tablet Take  1 tablet (75 mg total) by mouth daily. Qty: 30 tablet, Refills: 5    losartan (COZAAR) 25 MG tablet Take 1 tablet (25 mg total) by mouth daily. Qty: 30 tablet, Refills: 6      CONTINUE  these medications which have NOT CHANGED   Details  aspirin 81 MG tablet Take 81 mg by mouth daily. PM    Multiple Vitamin (MULTIVITAMIN) capsule Take 1 capsule by mouth daily. PM    Omega-3 Fatty Acids (FISH OIL) 1000 MG CAPS Take 2 capsules by mouth daily. PM    Probiotic Product (PROBIOTIC DAILY PO) Take 1 capsule by mouth daily. PM    fluticasone (FLONASE) 50 MCG/ACT nasal spray Place 2 sprays into both nostrils daily.          DISCHARGE INSTRUCTIONS:    Patient is to follow-up with primary care physician within one week after discharge  If you experience worsening of your admission symptoms, develop shortness of breath, life threatening emergency, suicidal or homicidal thoughts you must seek medical attention immediately by calling 911 or calling your MD immediately  if symptoms less severe.  You Must read complete instructions/literature along with all the possible adverse reactions/side effects for all the Medicines you take and that have been prescribed to you. Take any new Medicines after you have completely understood and accept all the possible adverse reactions/side effects.   Please note  You were cared for by a hospitalist during your hospital stay. If you have any questions about your discharge medications or the care you received while you were in the hospital after you are discharged, you can call the unit and asked to speak with the hospitalist on call if the hospitalist that took care of you is not available. Once you are discharged, your primary care physician will handle any further medical issues. Please note that NO REFILLS for any discharge medications will be authorized once you are discharged, as it is imperative that you return to your primary care physician (or establish a relationship with a primary care physician if you do not have one) for your aftercare needs so that they can reassess your need for medications and monitor your lab values.    Today    CHIEF COMPLAINT:   Chief Complaint  Patient presents with  . Code Stroke    HISTORY OF PRESENT ILLNESS:  James Compton  is a 65 y.o. male with a known history of vertigo, arthritis, who presents to the hospital with complaints of expressive aphasia, right-sided weakness, right upper extremity more than the right lower extremity. CT scan of head showed no abnormalities. He was admitted for TIA workup. The patient underwent MRI of the brain, revealing no acute stroke, carotid ultrasound, revealing internal carotid artery stenosis less than 50%, echocardiogram was ordered, not performed due to time constraints. Hemoglobin A1c was ordered, pending, TSH was normal at 2.2, PSA was normal at 1.0. Initial lipid panel revealed LDL of 120, triglycerides of 138. Total cholesterol level of 190. Advised to be continued on Lipitor, aspirin, Plavix therapy by neurologist, Dr. Doy Mince, who saw patient in consultation. The patient was seen by physical therapist who did not recommend no physical therapy follow-up. It was felt that patient is stable to be discharged to home with primary care physician's follow-up as outpatient.  Echocardiogram was recommended to be performed as outpatient. Discussion by problem: #1. TIA with difficulty of speech in the right upper extremity weakness, symptoms have resolved, continue patient on aspirin and Plavix. According  to Dr. Doy Mince, neurologist recommendations. Echocardiogram could not be performed due to time restraints. Patient remained in sinus rhythm on telemetry. Carotid ultrasound revealed no evidence of hemodynamically significant stenosis. Hemoglobin A1c was ordered, pending. LDL was 89, patient was initiated on Lipitor , recommended LDL target of less than 70. The patient may benefit from outpatient prolonged cardiac monitoring #2. Essential hypertension, patient is initiated on Cozaar, it is recommended to advance this medication to control his blood pressure as needed,  patient was counseled about salt intake, weight loss #3. Hyperlipidemia with LDL of 89, patient is to continue fish oil, initiated on Lipitor, LDL target was less than 70, according to neurologist, Dr. Doy Mince, patient was counseled about weight loss #4. Hyperglycemia, hemoglobin A1c is pending    VITAL SIGNS:  Blood pressure 134/63, pulse 63, temperature 97.9 F (36.6 C), temperature source Oral, resp. rate 18, height 5\' 9"  (1.753 m), weight 89.1 kg (196 lb 8 oz), SpO2 98 %.  I/O:    Intake/Output Summary (Last 24 hours) at 11/04/16 1607 Last data filed at 11/04/16 1341  Gross per 24 hour  Intake              960 ml  Output             1200 ml  Net             -240 ml    PHYSICAL EXAMINATION:  GENERAL:  65 y.o.-year-old patient lying in the bed with no acute distress.  EYES: Pupils equal, round, reactive to light and accommodation. No scleral icterus. Extraocular muscles intact.  HEENT: Head atraumatic, normocephalic. Oropharynx and nasopharynx clear.  NECK:  Supple, no jugular venous distention. No thyroid enlargement, no tenderness.  LUNGS: Normal breath sounds bilaterally, no wheezing, rales,rhonchi or crepitation. No use of accessory muscles of respiration.  CARDIOVASCULAR: S1, S2 normal. No murmurs, rubs, or gallops.  ABDOMEN: Soft, non-tender, non-distended. Bowel sounds present. No organomegaly or mass.  EXTREMITIES: No pedal edema, cyanosis, or clubbing.  NEUROLOGIC: Cranial nerves II through XII are intact. Muscle strength 5/5 in all extremities. Sensation intact. Gait not checked.  PSYCHIATRIC: The patient is alert and oriented x 3.  SKIN: No obvious rash, lesion, or ulcer.   DATA REVIEW:   CBC  Recent Labs Lab 11/03/16 1930  WBC 7.7  HGB 15.1  HCT 42.6  PLT 191    Chemistries   Recent Labs Lab 11/03/16 1841  NA 140  K 3.6  CL 105  CO2 27  GLUCOSE 105*  BUN 10  CREATININE 0.86  CALCIUM 9.2  AST 30  ALT 15*  ALKPHOS 133*  BILITOT 1.5*     Cardiac Enzymes  Recent Labs Lab 11/03/16 1841  TROPONINI <0.03    Microbiology Results  No results found for this or any previous visit.  RADIOLOGY:  Mr Brain 25 Contrast  Result Date: 11/04/2016 CLINICAL DATA:  65 year old male with recent episodes of right side weakness and expressive aphasia, now symptom free. Initial encounter. EXAM: MRI HEAD WITHOUT CONTRAST TECHNIQUE: Multiplanar, multiecho pulse sequences of the brain and surrounding structures were obtained without intravenous contrast. COMPARISON:  Head CT without contrast 11/03/2016 and earlier. FINDINGS: Brain: Cerebral volume is within normal limits for age. No restricted diffusion to suggest acute infarction. No midline shift, mass effect, evidence of mass lesion, ventriculomegaly, extra-axial collection or acute intracranial hemorrhage. Cervicomedullary junction and pituitary are within normal limits. Pear[place] Cables and white matter signal is within normal limits for age throughout the brain.  No cortical encephalomalacia or chronic cerebral blood products. Vascular: Major intracranial vascular flow voids are preserved. There is mild intracranial artery ectasia, primarily affecting the posterior circulation. Skull and upper cervical spine: Negative. Visualized bone marrow signal is within normal limits. Sinuses/Orbits: Normal orbits soft tissues. Trace left maxillary sinus mucosal thickening. Other: Visible internal auditory structures appear normal. Trace right mastoid fluid. Negative scalp soft tissues. IMPRESSION: Normal for age noncontrast MRI appearance of the brain. Electronically Signed   By: Genevie Ann M.D.   On: 11/04/2016 09:28   US Carotid Bilateral (at [facility] Only)  Result Date: 11/04/2016 CLINICAL DATA:  Right-sided weakness and slurred speech. EXAM: BILATERAL CAROTID DUPLEX ULTRASOUND TECHNIQUE: Pearline Cables scale imaging, color Doppler and duplex ultrasound were performed of bilateral carotid and vertebral arteries in the neck.  COMPARISON:  Brain MRI 11/04/2016 FINDINGS: Criteria: Quantification of carotid stenosis is based on velocity parameters that correlate the residual internal carotid diameter with NASCET-based stenosis levels, using the diameter of the distal internal carotid lumen as the denominator for stenosis measurement. The following velocity measurements were obtained: RIGHT ICA:  97 cm/sec CCA:  123456 cm/sec SYSTOLIC ICA/CCA RATIO:  0.8 DIASTOLIC ICA/CCA RATIO:  1.1 ECA:  159 cm/sec LEFT ICA:  141 cm/sec CCA:  89 cm/sec SYSTOLIC ICA/CCA RATIO:  1.6 DIASTOLIC ICA/CCA RATIO:  2.4 ECA:  130 cm/sec RIGHT CAROTID ARTERY: Intimal thickening and mild plaque in the distal common carotid artery and carotid bulb region. Minimal plaque in the proximal external carotid artery. External carotid artery is patent with normal waveform. Small amount of heterogeneous plaque in the proximal internal carotid artery. Normal waveforms and velocities in the internal carotid artery. RIGHT VERTEBRAL ARTERY: Antegrade flow and normal waveform in the right vertebral artery. LEFT CAROTID ARTERY: Small amount of calcified plaque near the origin of the left external carotid artery. External carotid artery is patent with normal waveform. Small amount of plaque in the proximal internal carotid artery. The distal internal carotid artery peak systolic velocity is mildly elevated measuring up to 141 cm/sec but this segment is torturous. Peak systolic velocity carotid artery ratios in left internal carotid artery are less than 2. LEFT VERTEBRAL ARTERY: Antegrade flow and normal waveform in the left vertebral artery. IMPRESSION: Mild atherosclerotic disease in the carotid arteries bilaterally. Peak systolic velocity in the distal left internal carotid artery is mildly elevated but this is probably related to tortuosity. Estimated degree of stenosis in the internal carotid arteries is less than 50% bilaterally. Patent vertebral arteries. Electronically Signed    By: Markus Daft M.D.   On: 11/04/2016 10:55   Ct Head Code Stroke W/o Cm  Result Date: 11/03/2016 CLINICAL DATA:  Code stroke. Right-sided weakness and slurred speech. EXAM: CT HEAD WITHOUT CONTRAST TECHNIQUE: Contiguous axial images were obtained from the base of the skull through the vertex without intravenous contrast. COMPARISON:  01/30/2012 FINDINGS: Brain: There is no evidence of acute cortical infarct, intracranial hemorrhage, mass, midline shift, or extra-axial fluid collection. The ventricles and sulci are normal. Vascular: Mild calcified atherosclerosis at the skullbase. No hyperdense vessel. Skull: No fracture or focal osseous lesion. Sinuses/Orbits: Visualized paranasal sinuses and mastoid air cells are clear. Visualized orbits are unremarkable. Other: None. ASPECTS [facility] Stroke Program Early CT Score) - Ganglionic level infarction (caudate, lentiform nuclei, internal capsule, insula, M1-M3 cortex): 7 - Supraganglionic infarction (M4-M6 cortex): 3 Total score (0-10 with 10 being normal): 10 IMPRESSION: 1. No evidence of acute intracranial abnormality. 2. ASPECTS is 10. These results were  called by telephone at the time of interpretation on 11/03/2016 at 6:25 pm to Dr. Charlotte Crumb , who verbally acknowledged these results. Electronically Signed   By: Logan Bores M.D.   On: 11/03/2016 18:26    EKG:   Orders placed or performed during the hospital encounter of 11/03/16  . ED EKG  . ED EKG  . EKG 12-Lead  . EKG 12-Lead      Management plans discussed with the patient, family and they are in agreement.  CODE STATUS:     Code Status Orders        Start     Ordered   11/03/16 2057  Full code  Continuous     11/03/16 2056    Code Status History    Date Active Date Inactive Code Status Order ID Comments User Context   This patient has a current code status but no historical code status.      TOTAL TIME TAKING CARE OF THIS PATIENT: 40 minutes.    Theodoro Grist M.D on  11/04/2016 at 4:07 PM  Between 7am to 6pm - Pager - [phone]  After 6pm go to [email or web address] - password [email or web address] [facility]  Office  [phone]  CC: Primary care physician; Lelon Huh, MD

## 2016-11-04 NOTE — Evaluation (Signed)
Physical Therapy Evaluation Patient Details Name: SHAYN FAILLACE MRN: [id number] DOB: 1951/01/26 Today's Date: 11/04/2016   History of Present Illness  presented to ER secondary to intermittent episodes (x3) of R UE > LE weakness, expressive aphasia (resolved in ER); admitted for TIA vs. CVA (MRI negative for acute change)  Clinical Impression  Upon evaluation, patient alert and oriented; follows all commands and demonstrates good insight/safety awareness.  Bilat UE/LE strength and ROM symmetrical and WFL; no focal weakness, sensory deficit or coordination deficit noted; no communication deficits appreciated.  Patient reports complete resolution of admitting symptoms, feels at baseline level of functional ability. Able to complete bed mobility, sit/stand, basic transfers and gait (220') without assist device, mod indep/indep.  Good gait speed with good mechanics/fluidity; no balance losses or safety concerns noted.  Modified DGI 12/12, indicative of very minimal/no fall risk with functional activity.  Completes head turns, start/stop, speed modulation, and changes of direction without difficulty. Patient appears baseline level of functional mobility, indep with all functional activities.  No skilled PT needs identified at this time; patient voices agreement/understanding.  Will complete initial order at this time; please re-consult should needs change.    Follow Up Recommendations No PT follow up    Equipment Recommendations       Recommendations for Other Services       Precautions / Restrictions Precautions Precautions: Fall Restrictions Weight Bearing Restrictions: No      Mobility  Bed Mobility Overal bed mobility: Independent                Transfers Overall transfer level: Independent                  Ambulation/Gait Ambulation/Gait assistance: Modified independent (Device/Increase time) Ambulation Distance (Feet): 220 Feet Assistive device: None        General Gait Details: reciprocal stepping pattern with good fluidity, symmetry; good arm swing and UE/LE control.  Able to modulate gait speed (running for brief stint) appropriately, stop suddenly and complete dynamic gait components (head turns, changes of direction) without LOB or safety concern  Stairs            Wheelchair Mobility    Modified Rankin (Stroke Patients Only)       Balance Overall balance assessment: Independent                               Standardized Balance Assessment Standardized Balance Assessment :  (Modified DGI, 12/12--good safety/stability with all components)           Pertinent Vitals/Pain Pain Assessment: No/denies pain    Home Living Family/patient expects to be discharged to:: Private residence Living Arrangements: Spouse/significant other Available Help at Discharge: Family Type of Home: House Home Access: Stairs to enter Entrance Stairs-Rails: None Entrance Stairs-Number of Steps: 3 Home Layout: One level Home Equipment: None      Prior Function Level of Independence: Independent         Comments: Indep for ADLs, household and community mobility; + driving, active golfer.  Works as Theme park manager at [employer].     Hand Dominance   Dominant Hand: Right    Extremity/Trunk Assessment   Upper Extremity Assessment: Overall WFL for tasks assessed           Lower Extremity Assessment: Overall WFL for tasks assessed (strength grossly symmetrical and WFL, 5/5; denies sensory deficit; coordination intact)  Communication   Communication:  (wears bilat hearing aides)  Cognition Arousal/Alertness: Awake/alert Behavior During Therapy: WFL for tasks assessed/performed Overall Cognitive Status: Within Functional Limits for tasks assessed                      General Comments      Exercises     Assessment/Plan    PT Assessment Patent does not need any further PT services  PT Problem List             PT Treatment Interventions      PT Goals (Current goals can be found in the Care Plan section)  Acute Rehab PT Goals Patient Stated Goal: to return home PT Goal Formulation: All assessment and education complete, DC therapy    Frequency     Barriers to discharge        Co-evaluation               End of Session Equipment Utilized During Treatment: Gait belt Activity Tolerance: Patient tolerated treatment well Patient left: in bed;with call bell/phone within reach      Functional Assessment Tool Used: clinical judgement, Modified DGI Functional Limitation: Mobility: Walking and moving around Mobility: Walking and Moving Around Current Status [phone]): 0 percent impaired, limited or restricted Mobility: Walking and Moving Around Goal Status [phone]): 0 percent impaired, limited or restricted Mobility: Walking and Moving Around Discharge Status [phone]): 0 percent impaired, limited or restricted    Time: 1210-1218 PT Time Calculation (min) (ACUTE ONLY): 8 min   Charges:   PT Evaluation $PT Eval Low Complexity: 1 Procedure     PT G Codes:   PT G-Codes **NOT FOR INPATIENT CLASS** Functional Assessment Tool Used: clinical judgement, Modified DGI Functional Limitation: Mobility: Walking and moving around Mobility: Walking and Moving Around Current Status [id number]): 0 percent impaired, limited or restricted Mobility: Walking and Moving Around Goal Status [id number]): 0 percent impaired, limited or restricted Mobility: Walking and Moving Around Discharge Status [id number]): 0 percent impaired, limited or restricted    Olivene Cookston H. Owens Shark, PT, DPT, NCS 11/04/16, 12:28 PM [phone]

## 2016-11-04 NOTE — Progress Notes (Signed)
PT Cancellation Note  Patient Details Name: TASON LARMORE MRN: [id number] DOB: [birth year]   Cancelled Treatment:    Reason Eval/Treat Not Completed: Other (comment). Consult received and chart reviewed. Evaluation attempted, however pt currently out of room for testing at this time. Will re-attempt in PM.   Breana Litts 11/04/2016, 8:43 AM Greggory Stallion, PT, DPT [phone]

## 2016-11-05 LAB — HEMOGLOBIN A1C
Hgb A1c MFr Bld: 5 % (ref 4.8–5.6)
MEAN PLASMA GLUCOSE: 97 mg/dL

## 2016-11-05 NOTE — Progress Notes (Signed)
Chaplain was making his rounds and visited with pt in room 101. Pt was in good spirits and awaiting discharge from hospital. Provided the ministry of prayer.    11/04/16 1250  Clinical Encounter Type  Visited With Patient  Visit Type Initial;Spiritual support  Referral From Nurse  Spiritual Encounters  Spiritual Needs Prayer

## 2016-11-06 ENCOUNTER — Ambulatory Visit (INDEPENDENT_AMBULATORY_CARE_PROVIDER_SITE_OTHER): Payer: Medicare Other | Admitting: Family Medicine

## 2016-11-06 ENCOUNTER — Encounter: Payer: Self-pay | Admitting: Family Medicine

## 2016-11-06 VITALS — BP 140/80 | HR 66 | Temp 97.9°F | Resp 16 | Wt 198.0 lb

## 2016-11-06 DIAGNOSIS — G459 Transient cerebral ischemic attack, unspecified: Secondary | ICD-10-CM

## 2016-11-06 DIAGNOSIS — I639 Cerebral infarction, unspecified: Secondary | ICD-10-CM

## 2016-11-06 NOTE — Progress Notes (Signed)
Patient: James Compton Male    DOB: 1951/03/02   65 y.o.   MRN: [id number] Visit Date: 11/06/2016  Today's Provider: Lelon Huh, MD   Chief Complaint  Patient presents with  . Hospitalization Follow-up   Subjective:    HPI  Follow up Hospitalization  Patient was admitted to [facility] on 11/03/2016 and discharged on 11/04/2016. He was treated for TIA. He presented to ER after having two episodes of being unable to get out words and once episode of heavy feeling of right hand each episodes lasted about two or three minutes.  He had normal cardiac enzymes, he had normal head CT and MRI Treatment for this included starting Lipitor and Cozaar. . Patient was also advised to continue aspirin and Plavix. Recommend Echocardiogram as out patient.  He reports good compliance with treatment. He reports this condition is resolved.  He had carotid ultrasound showing small plaque origin left external carotid artery and proximal internal carotid artery.  ------------------------------------------------------------------------------------       Allergies  Allergen Reactions  . Penicillins Nausea Only     Current Outpatient Prescriptions:  .  aspirin 81 MG tablet, Take 81 mg by mouth daily. PM, Disp: , Rfl:  .  atorvastatin (LIPITOR) 40 MG tablet, Take 1 tablet (40 mg total) by mouth daily at 6 PM., Disp: 30 tablet, Rfl: 5 .  clopidogrel (PLAVIX) 75 MG tablet, Take 1 tablet (75 mg total) by mouth daily., Disp: 30 tablet, Rfl: 5 .  fluticasone (FLONASE) 50 MCG/ACT nasal spray, Place 2 sprays into both nostrils daily. , Disp: , Rfl:  .  losartan (COZAAR) 25 MG tablet, Take 1 tablet (25 mg total) by mouth daily., Disp: 30 tablet, Rfl: 6 .  Multiple Vitamin (MULTIVITAMIN) capsule, Take 1 capsule by mouth daily. PM, Disp: , Rfl:  .  Omega-3 Fatty Acids (FISH OIL) 1000 MG CAPS, Take 2 capsules by mouth daily. PM, Disp: , Rfl:  .  Probiotic Product (PROBIOTIC DAILY PO), Take 1 capsule by  mouth daily. PM, Disp: , Rfl:  .  gentamicin ointment (GARAMYCIN) 0.1 %, Apply to affected area 3 times a day, Disp: , Rfl:   Review of Systems  Constitutional: Negative for appetite change, chills and fever.  Respiratory: Negative for chest tightness, shortness of breath and wheezing.   Cardiovascular: Negative for chest pain and palpitations.  Gastrointestinal: Negative for abdominal pain, nausea and vomiting.    Social History  Substance Use Topics  . Smoking status: Former Smoker    Quit date: 11/25/1970  . Smokeless tobacco: Never Used  . Alcohol use No   Objective:   BP 140/80 (BP Location: Left Arm, Patient Position: Sitting, Cuff Size: Large)   Pulse 66   Temp 97.9 F (36.6 C) (Oral)   Resp 16   Wt 198 lb (89.8 kg)   SpO2 97% Comment: room air  BMI 29.24 kg/m   Physical Exam   General Appearance:    Alert, cooperative, no distress  Eyes:    PERRL, conjunctiva/corneas clear, EOM's intact       Lungs:     Clear to auscultation bilaterally, respirations unlabored  Heart:    Regular rate and rhythm  Neurologic:   Awake, alert, oriented x 3. No apparent focal neurological           defect.           Assessment & Plan:     1. Transient cerebral ischemia, unspecified type Symptoms now  completley resolved.  - ECHOCARDIOGRAM COMPLETE; Future  Continue ASA and clopidogrel for now.  Has no history of htn prior to hospitalization. Continue losartan for now.       Lelon Huh, MD  [facility]

## 2016-11-07 DIAGNOSIS — M1711 Unilateral primary osteoarthritis, right knee: Secondary | ICD-10-CM | POA: Diagnosis not present

## 2016-11-07 DIAGNOSIS — M25561 Pain in right knee: Secondary | ICD-10-CM | POA: Diagnosis not present

## 2016-11-12 DIAGNOSIS — M1712 Unilateral primary osteoarthritis, left knee: Secondary | ICD-10-CM | POA: Diagnosis not present

## 2016-11-12 DIAGNOSIS — R040 Epistaxis: Secondary | ICD-10-CM | POA: Diagnosis not present

## 2016-11-12 DIAGNOSIS — T45515A Adverse effect of anticoagulants, initial encounter: Secondary | ICD-10-CM | POA: Diagnosis not present

## 2016-11-12 DIAGNOSIS — M25562 Pain in left knee: Secondary | ICD-10-CM | POA: Diagnosis not present

## 2016-11-13 ENCOUNTER — Telehealth: Payer: Self-pay | Admitting: Family Medicine

## 2016-11-13 NOTE — Telephone Encounter (Signed)
Patient advised and verbally voiced understanding.  

## 2016-11-13 NOTE — Telephone Encounter (Signed)
Pt states he had a nose bleed yesterday and went to [facility] to get this to stop.  Pt states Dr Tami Ribas is concerned that when the packing is removed the bleeding may start again.  Pt is asking id he can stop the Plavix or the aspirin for a little bit?  CB#[phone]/MW

## 2016-11-13 NOTE — Telephone Encounter (Signed)
He can put aspirin and plavix on hold for 5 days which should minimize risk of additional nose bleeds.

## 2016-11-19 DIAGNOSIS — R040 Epistaxis: Secondary | ICD-10-CM | POA: Diagnosis not present

## 2016-11-20 ENCOUNTER — Ambulatory Visit: Payer: Medicare Other

## 2016-11-26 ENCOUNTER — Ambulatory Visit
Admission: RE | Admit: 2016-11-26 | Discharge: 2016-11-26 | Disposition: A | Discharge status: Home / Self Care | Payer: Medicare Other | Source: Ambulatory Visit | Attending: Family Medicine | Admitting: Family Medicine

## 2016-11-26 DIAGNOSIS — I071 Rheumatic tricuspid insufficiency: Secondary | ICD-10-CM | POA: Diagnosis not present

## 2016-11-26 DIAGNOSIS — M1990 Unspecified osteoarthritis, unspecified site: Secondary | ICD-10-CM | POA: Insufficient documentation

## 2016-11-26 DIAGNOSIS — G459 Transient cerebral ischemic attack, unspecified: Secondary | ICD-10-CM | POA: Insufficient documentation

## 2016-11-26 DIAGNOSIS — K219 Gastro-esophageal reflux disease without esophagitis: Secondary | ICD-10-CM | POA: Diagnosis not present

## 2016-11-26 NOTE — Progress Notes (Signed)
*  PRELIMINARY RESULTS* Echocardiogram 2D Echocardiogram has been performed.  Sherrie Sport 11/26/2016, 10:20 AM

## 2016-11-27 DIAGNOSIS — M25561 Pain in right knee: Secondary | ICD-10-CM | POA: Diagnosis not present

## 2016-11-27 DIAGNOSIS — M1711 Unilateral primary osteoarthritis, right knee: Secondary | ICD-10-CM | POA: Diagnosis not present

## 2016-11-28 DIAGNOSIS — M25562 Pain in left knee: Secondary | ICD-10-CM | POA: Diagnosis not present

## 2016-11-28 DIAGNOSIS — M1712 Unilateral primary osteoarthritis, left knee: Secondary | ICD-10-CM | POA: Diagnosis not present

## 2016-12-02 DIAGNOSIS — M170 Bilateral primary osteoarthritis of knee: Secondary | ICD-10-CM | POA: Diagnosis not present

## 2016-12-02 DIAGNOSIS — M25562 Pain in left knee: Secondary | ICD-10-CM | POA: Diagnosis not present

## 2016-12-02 DIAGNOSIS — M25561 Pain in right knee: Secondary | ICD-10-CM | POA: Diagnosis not present

## 2016-12-23 DIAGNOSIS — R262 Difficulty in walking, not elsewhere classified: Secondary | ICD-10-CM | POA: Diagnosis not present

## 2016-12-23 DIAGNOSIS — M25561 Pain in right knee: Secondary | ICD-10-CM | POA: Diagnosis not present

## 2016-12-23 DIAGNOSIS — M170 Bilateral primary osteoarthritis of knee: Secondary | ICD-10-CM | POA: Diagnosis not present

## 2017-01-03 ENCOUNTER — Ambulatory Visit (INDEPENDENT_AMBULATORY_CARE_PROVIDER_SITE_OTHER): Payer: Medicare Other | Admitting: Family Medicine

## 2017-01-03 ENCOUNTER — Encounter: Payer: Self-pay | Admitting: Family Medicine

## 2017-01-03 VITALS — BP 140/74 | HR 67 | Temp 98.0°F | Resp 16 | Ht 69.0 in | Wt 198.0 lb

## 2017-01-03 DIAGNOSIS — G458 Other transient cerebral ischemic attacks and related syndromes: Secondary | ICD-10-CM

## 2017-01-03 DIAGNOSIS — T753XXS Motion sickness, sequela: Secondary | ICD-10-CM | POA: Diagnosis not present

## 2017-01-03 MED ORDER — SCOPOLAMINE 1 MG/3DAYS TD PT72: 1.0000 | MEDICATED_PATCH | TRANSDERMAL | 0 refills | Status: DC

## 2017-01-03 NOTE — Patient Instructions (Signed)
Try Coenzyme Q10 200 once a day for a month. If still having fatigue or muscle we can try changing to a different statin.

## 2017-01-03 NOTE — Progress Notes (Signed)
Patient: James Compton Male    DOB: 1950/12/18   66 y.o.   MRN: [id number] Visit Date: 01/03/2017  Today's Provider: Lelon Huh, MD   Chief Complaint  Patient presents with  . Follow-up   Subjective:    HPI  Transient cerebral ischemia, unspecified type From 11/06/2016-Symptoms now completley resolved.  ECHOCARDIOGRAM COMPLETE ordered/normal. Patient advised to stay on Plavix and aspirin and then follow-up in 1 month.  Carotid u/s showed minimal plaque. Has had no more episode of speech difficulty or arma weakness since hospital discharge. Only complain today is soreness in arm and feeling fatigued.   Is also planning on going on cruise next week and requests prescription for scopolamine patch.    Allergies  Allergen Reactions  . Penicillins Nausea Only     Current Outpatient Prescriptions:  .  aspirin 81 MG tablet, Take 81 mg by mouth daily. PM, Disp: , Rfl:  .  atorvastatin (LIPITOR) 40 MG tablet, Take 1 tablet (40 mg total) by mouth daily at 6 PM., Disp: 30 tablet, Rfl: 5 .  clopidogrel (PLAVIX) 75 MG tablet, Take 1 tablet (75 mg total) by mouth daily., Disp: 30 tablet, Rfl: 5 .  fluticasone (FLONASE) 50 MCG/ACT nasal spray, Place 2 sprays into both nostrils daily. , Disp: , Rfl:  .  gentamicin ointment (GARAMYCIN) 0.1 %, Apply to affected area 3 times a day, Disp: , Rfl:  .  losartan (COZAAR) 25 MG tablet, Take 1 tablet (25 mg total) by mouth daily., Disp: 30 tablet, Rfl: 6 .  Multiple Vitamin (MULTIVITAMIN) capsule, Take 1 capsule by mouth daily. PM, Disp: , Rfl:  .  Probiotic Product (PROBIOTIC DAILY PO), Take 1 capsule by mouth daily. PM, Disp: , Rfl:   Review of Systems  Constitutional: Negative for appetite change, chills and fever.  Respiratory: Negative for chest tightness, shortness of breath and wheezing.   Cardiovascular: Negative for chest pain and palpitations.  Gastrointestinal: Negative for abdominal pain, nausea and vomiting.    Social History    Substance Use Topics  . Smoking status: Former Smoker    Quit date: 11/25/1970  . Smokeless tobacco: Never Used  . Alcohol use No   Objective:   BP 140/74 (BP Location: Right Arm, Patient Position: Sitting, Cuff Size: Large)   Pulse 67   Temp 98 F (36.7 C) (Oral)   Resp 16   Ht 5\' 9"  (1.753 m)   Wt 198 lb (89.8 kg)   SpO2 99%   BMI 29.24 kg/m   Physical Exam  General appearance: alert, well developed, well nourished, cooperative and in no distress Head: Normocephalic, without obvious abnormality, atraumatic Respiratory: Respirations even and unlabored, normal respiratory rate Extremities: No gross deformities Skin: Skin color, texture, turgor normal. No rashes seen  Psych: Appropriate mood and affect. Neurologic: Mental status: Alert, oriented to person, place, and time, thought content appropriate.     Assessment & Plan:     1. Follow up TIA No symptoms since hospital discharge. Tolerant ECASA and clopidogrel.  Patient Instructions  Try Coenzyme Q10 200 once a day for a month. If still having fatigue or muscle we can try changing to a different statin.   Check lipids and CBC at follow up    2. Motion sickness, sequela  - scopolamine (TRANSDERM-SCOP, 1.5 MG,) 1 MG/3DAYS; Place 1 patch (1.5 mg total) onto the skin every 3 (three) days.  Dispense: 4 patch; Refill: 0  Return in about 6 months (around  07/03/2017).       Lelon Huh, MD  [facility]

## 2017-01-14 DIAGNOSIS — M25561 Pain in right knee: Secondary | ICD-10-CM | POA: Diagnosis not present

## 2017-01-14 DIAGNOSIS — M1731 Unilateral post-traumatic osteoarthritis, right knee: Secondary | ICD-10-CM | POA: Diagnosis not present

## 2017-01-17 ENCOUNTER — Telehealth: Payer: Self-pay

## 2017-01-17 MED ORDER — TRAMADOL HCL 50 MG PO TABS: 50.0000 mg | ORAL_TABLET | Freq: Three times a day (TID) | ORAL | 1 refills | Status: DC | PRN

## 2017-01-17 NOTE — Telephone Encounter (Signed)
Called in Rx as below. Patient was advised.  

## 2017-01-17 NOTE — Telephone Encounter (Signed)
Patient reports that he is having severe pain in his right knee. He reports that he has arthritis in his right knee, and due to being on a blood thinner, he was told to only take Tylenol. Patient reports that the tylenol is not helping with the pain and he would like to try something different. Patient reports that he can not take Ibuprofen products.   He also mentioned that he will F/U with Dr. Marry Guan to discuss possible knee replacement. Patient would like something for pain until he gets in to see Ortho. Contact number is correct.

## 2017-01-17 NOTE — Telephone Encounter (Signed)
Please call in tramadol.  

## 2017-02-11 ENCOUNTER — Other Ambulatory Visit: Payer: Self-pay | Admitting: Orthopedic Surgery

## 2017-02-11 DIAGNOSIS — M25561 Pain in right knee: Secondary | ICD-10-CM | POA: Diagnosis not present

## 2017-02-11 DIAGNOSIS — G8929 Other chronic pain: Secondary | ICD-10-CM | POA: Diagnosis not present

## 2017-02-11 DIAGNOSIS — M2391 Unspecified internal derangement of right knee: Secondary | ICD-10-CM | POA: Diagnosis not present

## 2017-02-21 ENCOUNTER — Ambulatory Visit
Admission: RE | Admit: 2017-02-21 | Discharge: 2017-02-21 | Disposition: A | Discharge status: Home / Self Care | Payer: Medicare Other | Source: Ambulatory Visit | Attending: Orthopedic Surgery | Admitting: Orthopedic Surgery

## 2017-02-21 DIAGNOSIS — M1711 Unilateral primary osteoarthritis, right knee: Secondary | ICD-10-CM | POA: Diagnosis not present

## 2017-02-21 DIAGNOSIS — M65861 Other synovitis and tenosynovitis, right lower leg: Secondary | ICD-10-CM | POA: Insufficient documentation

## 2017-02-21 DIAGNOSIS — M6751 Plica syndrome, right knee: Secondary | ICD-10-CM | POA: Diagnosis not present

## 2017-02-21 DIAGNOSIS — S83241A Other tear of medial meniscus, current injury, right knee, initial encounter: Secondary | ICD-10-CM | POA: Insufficient documentation

## 2017-02-21 DIAGNOSIS — X58XXXA Exposure to other specified factors, initial encounter: Secondary | ICD-10-CM | POA: Insufficient documentation

## 2017-02-21 DIAGNOSIS — M25561 Pain in right knee: Secondary | ICD-10-CM | POA: Insufficient documentation

## 2017-02-21 DIAGNOSIS — M25461 Effusion, right knee: Secondary | ICD-10-CM | POA: Insufficient documentation

## 2017-03-04 DIAGNOSIS — M2391 Unspecified internal derangement of right knee: Secondary | ICD-10-CM | POA: Diagnosis not present

## 2017-03-06 DIAGNOSIS — M170 Bilateral primary osteoarthritis of knee: Secondary | ICD-10-CM | POA: Diagnosis not present

## 2017-03-06 DIAGNOSIS — M25562 Pain in left knee: Secondary | ICD-10-CM | POA: Diagnosis not present

## 2017-03-06 DIAGNOSIS — M25561 Pain in right knee: Secondary | ICD-10-CM | POA: Diagnosis not present

## 2017-03-17 ENCOUNTER — Telehealth: Payer: Self-pay | Admitting: Family Medicine

## 2017-03-17 ENCOUNTER — Encounter
Admission: RE | Admit: 2017-03-17 | Discharge: 2017-03-17 | Disposition: A | Discharge status: Home / Self Care | Payer: Medicare Other | Source: Ambulatory Visit | Attending: Orthopedic Surgery | Admitting: Orthopedic Surgery

## 2017-03-17 DIAGNOSIS — Z01812 Encounter for preprocedural laboratory examination: Secondary | ICD-10-CM | POA: Insufficient documentation

## 2017-03-17 HISTORY — DX: Nausea with vomiting, unspecified: Z98.890

## 2017-03-17 HISTORY — DX: Transient cerebral ischemic attack, unspecified: G45.9

## 2017-03-17 HISTORY — DX: Nausea with vomiting, unspecified: R11.2

## 2017-03-17 LAB — BASIC METABOLIC PANEL
ANION GAP: 7 (ref 5–15)
BUN: 14 mg/dL (ref 6–20)
CALCIUM: 9.1 mg/dL (ref 8.9–10.3)
CO2: 27 mmol/L (ref 22–32)
CREATININE: 0.62 mg/dL (ref 0.61–1.24)
Chloride: 104 mmol/L (ref 101–111)
GFR calc Af Amer: >60 mL/min (ref >60)
GLUCOSE: 100 mg/dL — AB (ref 65–99)
Potassium: 3.4 mmol/L — ABNORMAL LOW (ref 3.5–5.1)
Sodium: 138 mmol/L (ref 135–145)

## 2017-03-17 NOTE — Telephone Encounter (Signed)
Should be off of both of these for 5 days before surgery.

## 2017-03-17 NOTE — Pre-Procedure Instructions (Signed)
Pt went on a cruise in February of 2018 and used the scopolamine patch to prevent motion sickness.  Pt reports he did not have any bad reaction to the patch and did not have any motion sickness.   Progress Notes Encounter Date: 01/03/2017 [name], MD  Dermatology    [] Hide copied text       Patient: James Compton Male    DOB: 1951/10/23   66 y.o.   MRN: [id number] Visit Date: 01/03/2017  Today's Provider: Lelon Huh, MD      Chief Complaint  Patient presents with  . Follow-up   Subjective:  HPI  Transient cerebral ischemia, unspecified type From 11/06/2016-Symptoms now completley resolved.  ECHOCARDIOGRAM COMPLETE ordered/normal. Patient advised to stay on Plavix and aspirin and then follow-up in 1 month.  Carotid u/s showed minimal plaque. Has had no more episode of speech difficulty or arma weakness since hospital discharge. Only complain today is soreness in arm and feeling fatigued.   Is also planning on going on cruise next week and requests prescription for scopolamine patch.        Allergies  Allergen Reactions  . Penicillins Nausea Only     Current Outpatient Prescriptions:  .  aspirin 81 MG tablet, Take 81 mg by mouth daily. PM, Disp: , Rfl:  .  atorvastatin (LIPITOR) 40 MG tablet, Take 1 tablet (40 mg total) by mouth daily at 6 PM., Disp: 30 tablet, Rfl: 5 .  clopidogrel (PLAVIX) 75 MG tablet, Take 1 tablet (75 mg total) by mouth daily., Disp: 30 tablet, Rfl: 5 .  fluticasone (FLONASE) 50 MCG/ACT nasal spray, Place 2 sprays into both nostrils daily. , Disp: , Rfl:  .  gentamicin ointment (GARAMYCIN) 0.1 %, Apply to affected area 3 times a day, Disp: , Rfl:  .  losartan (COZAAR) 25 MG tablet, Take 1 tablet (25 mg total) by mouth daily., Disp: 30 tablet, Rfl: 6 .  Multiple Vitamin (MULTIVITAMIN) capsule, Take 1 capsule by mouth daily. PM, Disp: , Rfl:  .  Probiotic Product (PROBIOTIC DAILY PO), Take 1 capsule by mouth daily. PM, Disp: ,  Rfl:   Review of Systems  Constitutional: Negative for appetite change, chills and fever.  Respiratory: Negative for chest tightness, shortness of breath and wheezing.   Cardiovascular: Negative for chest pain and palpitations.  Gastrointestinal: Negative for abdominal pain, nausea and vomiting.         Social History  Substance Use Topics  . Smoking status: Former Smoker    Quit date: 11/25/1970  . Smokeless tobacco: Never Used  . Alcohol use No   Objective:   BP 140/74 (BP Location: Right Arm, Patient Position: Sitting, Cuff Size: Large)   Pulse 67   Temp 98 F (36.7 C) (Oral)   Resp 16   Ht 5\' 9"  (1.753 m)   Wt 198 lb (89.8 kg)   SpO2 99%   BMI 29.24 kg/m   Physical Exam  General appearance: alert, well developed, well nourished, cooperative and in no distress Head: Normocephalic, without obvious abnormality, atraumatic Respiratory: Respirations even and unlabored, normal respiratory rate Extremities: No gross deformities Skin: Skin color, texture, turgor normal. No rashes seen  Psych: Appropriate mood and affect. Neurologic: Mental status: Alert, oriented to person, place, and time, thought content appropriate.     Assessment & Plan:  1. Follow up TIA No symptoms since hospital discharge. Tolerant ECASA and clopidogrel.  Patient Instructions  Try Coenzyme Q10 200 once a day for a month.  If still having fatigue or muscle we can try changing to a different statin.   Check lipids and CBC at follow up    2. Motion sickness, sequela  - scopolamine (TRANSDERM-SCOP, 1.5 MG,) 1 MG/3DAYS; Place 1 patch (1.5 mg total) onto the skin every 3 (three) days.  Dispense: 4 patch; Refill: 0  Return in about 6 months (around 07/03/2017).    Lelon Huh, MD  [facility]

## 2017-03-17 NOTE — Telephone Encounter (Signed)
Patient is having surgery on May 9 wants to know how long to come off the aspirin and plavix

## 2017-03-17 NOTE — Patient Instructions (Signed)
  Your procedure is scheduled on:Wednesday Apr 02, 2017. Report to Same Day Surgery. To find out your arrival time please call [phone] between 1PM - 3PM on Tuesday Apr 01, 2017.  Remember: Instructions that are not followed completely may result in serious medical risk, up to and including death, or upon the discretion of your surgeon and anesthesiologist your surgery may need to be rescheduled.    _x___ 1. Do not eat food or drink liquids after midnight. No gum chewing or hard candies.     ____ 2. No Alcohol for 24 hours before or after surgery.   ____ 3. Bring all medications with you on the day of surgery if instructed.    __x__ 4. Notify your doctor if there is any change in your medical condition     (cold, fever, infections).    _____ 5. No smoking 24 hours prior to surgery.     Do not wear jewelry, make-up, hairpins, clips or nail polish.  Do not wear lotions, powders, or perfumes.   Do not shave 48 hours prior to surgery. Men may shave face and neck.  Do not bring valuables to the hospital.    [facility] is not responsible for any belongings or valuables.               Contacts, dentures or bridgework may not be worn into surgery.  Leave your suitcase in the car. After surgery it may be brought to your room.  For patients admitted to the hospital, discharge time is determined by your treatment team.   Patients discharged the day of surgery will not be allowed to drive home.    Please read over the following fact sheets that you were given:   [facility] Preparing for Surgery  ____ Take these medicines the morning of surgery with A SIP OF WATER: NONE    ____ Fleet Enema (as directed)   _x___ Use CHG Soap as directed on instruction sheet  ____ Use inhalers on the day of surgery and bring to hospital day of surgery  ____ Stop metformin 2 days prior to surgery    ____ Take 1/2 of usual insulin dose the night before surgery and none on the morning of surgery.    _x___ Pt to call Dr. Caryn Section to find out if and when to stop Plavix and aspirin prior to upcoming knee surgery.  _x___ Stop Anti-inflammatories such as Advil, Aleve, Ibuprofen, Motrin, Naproxen, Naprosyn, Goodies powders or aspirin products. OK to take Tramadol.   _x___ Stop supplements: Coenzyme Q10 (COQ10)   until after surgery.    ____ Bring C-Pap to the hospital.

## 2017-03-17 NOTE — Telephone Encounter (Signed)
Advised  ED 

## 2017-03-17 NOTE — Telephone Encounter (Signed)
Please advise 

## 2017-04-02 ENCOUNTER — Encounter: Payer: Self-pay | Admitting: Orthopedic Surgery

## 2017-04-02 ENCOUNTER — Ambulatory Visit
Admission: RE | Admit: 2017-04-02 | Discharge: 2017-04-02 | Disposition: A | Discharge status: Home / Self Care | Payer: Medicare Other | Source: Ambulatory Visit | Attending: Orthopedic Surgery | Admitting: Orthopedic Surgery

## 2017-04-02 ENCOUNTER — Ambulatory Visit: Payer: Medicare Other | Admitting: Certified Registered Nurse Anesthetist

## 2017-04-02 ENCOUNTER — Encounter
Admission: RE | Disposition: A | Discharge status: Home / Self Care | Payer: Self-pay | Source: Ambulatory Visit | Attending: Orthopedic Surgery

## 2017-04-02 DIAGNOSIS — Z7902 Long term (current) use of antithrombotics/antiplatelets: Secondary | ICD-10-CM | POA: Insufficient documentation

## 2017-04-02 DIAGNOSIS — Z87891 Personal history of nicotine dependence: Secondary | ICD-10-CM | POA: Insufficient documentation

## 2017-04-02 DIAGNOSIS — M2391 Unspecified internal derangement of right knee: Secondary | ICD-10-CM | POA: Diagnosis not present

## 2017-04-02 DIAGNOSIS — Z880 Allergy status to penicillin: Secondary | ICD-10-CM | POA: Diagnosis not present

## 2017-04-02 DIAGNOSIS — E119 Type 2 diabetes mellitus without complications: Secondary | ICD-10-CM | POA: Diagnosis not present

## 2017-04-02 DIAGNOSIS — M23221 Derangement of posterior horn of medial meniscus due to old tear or injury, right knee: Secondary | ICD-10-CM | POA: Insufficient documentation

## 2017-04-02 DIAGNOSIS — Z79899 Other long term (current) drug therapy: Secondary | ICD-10-CM | POA: Insufficient documentation

## 2017-04-02 DIAGNOSIS — M23211 Derangement of anterior horn of medial meniscus due to old tear or injury, right knee: Secondary | ICD-10-CM | POA: Diagnosis not present

## 2017-04-02 DIAGNOSIS — M94261 Chondromalacia, right knee: Secondary | ICD-10-CM | POA: Diagnosis not present

## 2017-04-02 DIAGNOSIS — Z8673 Personal history of transient ischemic attack (TIA), and cerebral infarction without residual deficits: Secondary | ICD-10-CM | POA: Insufficient documentation

## 2017-04-02 DIAGNOSIS — I10 Essential (primary) hypertension: Secondary | ICD-10-CM | POA: Insufficient documentation

## 2017-04-02 DIAGNOSIS — Z7982 Long term (current) use of aspirin: Secondary | ICD-10-CM | POA: Diagnosis not present

## 2017-04-02 DIAGNOSIS — K219 Gastro-esophageal reflux disease without esophagitis: Secondary | ICD-10-CM | POA: Diagnosis not present

## 2017-04-02 HISTORY — PX: KNEE ARTHROSCOPY WITH MEDIAL MENISECTOMY: SHX5651

## 2017-04-02 SURGERY — ARTHROSCOPY, KNEE, WITH MEDIAL MENISCECTOMY
Anesthesia: General | Laterality: Right | Wound class: Clean

## 2017-04-02 MED ORDER — SCOPOLAMINE 1 MG/3DAYS TD PT72
MEDICATED_PATCH | TRANSDERMAL | Status: AC
  Administered 2017-04-02: 1.5 mg
  Filled 2017-04-02: qty 1

## 2017-04-02 MED ORDER — ACETAMINOPHEN 10 MG/ML IV SOLN
INTRAVENOUS | Status: AC
  Filled 2017-04-02: qty 100

## 2017-04-02 MED ORDER — BUPIVACAINE-EPINEPHRINE (PF) 0.25% -1:200000 IJ SOLN
INTRAMUSCULAR | Status: AC
  Filled 2017-04-02: qty 30

## 2017-04-02 MED ORDER — MORPHINE SULFATE 4 MG/ML IJ SOLN
INTRAMUSCULAR | Status: DC | PRN
Start: 2017-04-02 — End: 2017-04-02
  Administered 2017-04-02: 4 mg via INTRAVENOUS

## 2017-04-02 MED ORDER — FENTANYL CITRATE (PF) 100 MCG/2ML IJ SOLN
INTRAMUSCULAR | Status: AC
  Filled 2017-04-02: qty 2

## 2017-04-02 MED ORDER — GLYCOPYRROLATE 0.2 MG/ML IJ SOLN
INTRAMUSCULAR | Status: DC | PRN
  Administered 2017-04-02: 0.2 mg via INTRAVENOUS

## 2017-04-02 MED ORDER — BUPIVACAINE-EPINEPHRINE 0.25% -1:200000 IJ SOLN
INTRAMUSCULAR | Status: DC | PRN
  Administered 2017-04-02: 30 mL

## 2017-04-02 MED ORDER — HYDROCODONE-ACETAMINOPHEN 5-325 MG PO TABS: 1.0000 | ORAL_TABLET | ORAL | 0 refills | Status: DC | PRN

## 2017-04-02 MED ORDER — PROPOFOL 10 MG/ML IV BOLUS
INTRAVENOUS | Status: DC | PRN
  Administered 2017-04-02: 200 mg via INTRAVENOUS

## 2017-04-02 MED ORDER — MIDAZOLAM HCL 2 MG/2ML IJ SOLN
INTRAMUSCULAR | Status: AC
  Filled 2017-04-02: qty 2

## 2017-04-02 MED ORDER — MORPHINE SULFATE (PF) 4 MG/ML IV SOLN
INTRAVENOUS | Status: AC
  Filled 2017-04-02: qty 1

## 2017-04-02 MED ORDER — ACETAMINOPHEN 10 MG/ML IV SOLN
INTRAVENOUS | Status: DC | PRN
  Administered 2017-04-02: 1000 mg via INTRAVENOUS

## 2017-04-02 MED ORDER — LIDOCAINE HCL (PF) 2 % IJ SOLN
INTRAMUSCULAR | Status: AC
  Filled 2017-04-02: qty 2

## 2017-04-02 MED ORDER — FAMOTIDINE 20 MG PO TABS
20.0000 mg | ORAL_TABLET | Freq: Once | ORAL | Status: AC
Start: 2017-04-02 — End: 2017-04-02
  Administered 2017-04-02: 20 mg via ORAL

## 2017-04-02 MED ORDER — ONDANSETRON HCL 4 MG/2ML IJ SOLN
INTRAMUSCULAR | Status: DC | PRN
  Administered 2017-04-02: 4 mg via INTRAVENOUS

## 2017-04-02 MED ORDER — SCOPOLAMINE 1 MG/3DAYS TD PT72: 1.0000 | MEDICATED_PATCH | TRANSDERMAL | Status: DC

## 2017-04-02 MED ORDER — LIDOCAINE HCL (CARDIAC) 20 MG/ML IV SOLN
INTRAVENOUS | Status: DC | PRN
  Administered 2017-04-02: 100 mg via INTRAVENOUS

## 2017-04-02 MED ORDER — FAMOTIDINE 20 MG PO TABS
ORAL_TABLET | ORAL | Status: AC
  Administered 2017-04-02: 20 mg via ORAL
  Filled 2017-04-02: qty 1

## 2017-04-02 MED ORDER — PROMETHAZINE HCL 25 MG/ML IJ SOLN
6.2500 mg | INTRAMUSCULAR | Status: DC | PRN
  Administered 2017-04-02: 6.25 mg via INTRAVENOUS

## 2017-04-02 MED ORDER — FENTANYL CITRATE (PF) 100 MCG/2ML IJ SOLN
INTRAMUSCULAR | Status: DC | PRN
  Administered 2017-04-02: 25 ug via INTRAVENOUS
  Administered 2017-04-02 (×3): 50 ug via INTRAVENOUS
  Administered 2017-04-02: 25 ug via INTRAVENOUS

## 2017-04-02 MED ORDER — PROMETHAZINE HCL 25 MG/ML IJ SOLN
INTRAMUSCULAR | Status: AC
  Filled 2017-04-02: qty 1

## 2017-04-02 MED ORDER — PHENYLEPHRINE HCL 10 MG/ML IJ SOLN
INTRAMUSCULAR | Status: DC | PRN
  Administered 2017-04-02 (×3): 100 ug via INTRAVENOUS
  Administered 2017-04-02: 50 ug via INTRAVENOUS
  Administered 2017-04-02: 100 ug via INTRAVENOUS
  Administered 2017-04-02 (×2): 50 ug via INTRAVENOUS
  Administered 2017-04-02 (×2): 100 ug via INTRAVENOUS
  Administered 2017-04-02: 50 ug via INTRAVENOUS

## 2017-04-02 MED ORDER — MIDAZOLAM HCL 2 MG/2ML IJ SOLN
INTRAMUSCULAR | Status: DC | PRN
Start: 2017-04-02 — End: 2017-04-02
  Administered 2017-04-02: 2 mg via INTRAVENOUS

## 2017-04-02 MED ORDER — CHLORHEXIDINE GLUCONATE 4 % EX LIQD: 60.0000 mL | Freq: Once | CUTANEOUS | Status: DC

## 2017-04-02 MED ORDER — PROPOFOL 10 MG/ML IV BOLUS
INTRAVENOUS | Status: AC
  Filled 2017-04-02: qty 20

## 2017-04-02 MED ORDER — SODIUM CHLORIDE 0.9 % IJ SOLN
INTRAMUSCULAR | Status: AC
  Filled 2017-04-02: qty 20

## 2017-04-02 MED ORDER — DEXAMETHASONE SODIUM PHOSPHATE 10 MG/ML IJ SOLN
INTRAMUSCULAR | Status: DC | PRN
  Administered 2017-04-02: 10 mg via INTRAVENOUS

## 2017-04-02 MED ORDER — LACTATED RINGERS IV SOLN
INTRAVENOUS | Status: DC
  Administered 2017-04-02 (×2): via INTRAVENOUS

## 2017-04-02 MED ORDER — FENTANYL CITRATE (PF) 100 MCG/2ML IJ SOLN: 25.0000 ug | INTRAMUSCULAR | Status: DC | PRN

## 2017-04-02 SURGICAL SUPPLY — 23 items
BLADE SHAVER 4.5 DBL SERAT CV (CUTTER) IMPLANT
BNDG ESMARK 6X12 TAN STRL LF (GAUZE/BANDAGES/DRESSINGS) ×4 IMPLANT
CUFF TOURN 24 STER (MISCELLANEOUS) ×4 IMPLANT
CUFF TOURN 30 STER DUAL PORT (MISCELLANEOUS) ×4 IMPLANT
DRSG DERMACEA 8X12 NADH (GAUZE/BANDAGES/DRESSINGS) ×4 IMPLANT
DURAPREP 26ML APPLICATOR (WOUND CARE) ×8 IMPLANT
GAUZE SPONGE 4X4 12PLY STRL (GAUZE/BANDAGES/DRESSINGS) ×4 IMPLANT
GLOVE BIOGEL M STRL SZ7.5 (GLOVE) ×4 IMPLANT
GLOVE INDICATOR 8.0 STRL GRN (GLOVE) ×4 IMPLANT
GOWN STRL REUS W/ TWL LRG LVL3 (GOWN DISPOSABLE) ×4 IMPLANT
GOWN STRL REUS W/TWL LRG LVL3 (GOWN DISPOSABLE) ×4
IV LACTATED RINGER IRRG 3000ML (IV SOLUTION) ×12
IV LR IRRIG 3000ML ARTHROMATIC (IV SOLUTION) ×12 IMPLANT
KIT RM TURNOVER STRD PROC AR (KITS) ×4 IMPLANT
MANIFOLD NEPTUNE II (INSTRUMENTS) ×4 IMPLANT
PACK ARTHROSCOPY KNEE (MISCELLANEOUS) ×4 IMPLANT
SET TUBE SUCT SHAVER OUTFL 24K (TUBING) ×4 IMPLANT
SET TUBE TIP INTRA-ARTICULAR (MISCELLANEOUS) ×4 IMPLANT
SUT ETHILON 3-0 FS-10 30 BLK (SUTURE) ×4
SUTURE EHLN 3-0 FS-10 30 BLK (SUTURE) ×2 IMPLANT
TUBING ARTHRO INFLOW-ONLY STRL (TUBING) ×4 IMPLANT
WAND COBLATION FLOW 50 (SURGICAL WAND) ×4 IMPLANT
WRAP KNEE W/COLD PACKS 25.5X14 (SOFTGOODS) ×4 IMPLANT

## 2017-04-02 NOTE — Anesthesia Postprocedure Evaluation (Signed)
Anesthesia Post Note  Patient: James Compton  Procedure(s) Performed: Procedure(s): KNEE ARTHROSCOPY WITH MEDIAL MENISECTOMY CHONDRAPLASTY, SYNOVECTOMY  Patient location during evaluation: PACU Anesthesia Type: General Level of consciousness: awake and alert Pain management: pain level controlled Vital Signs Assessment: post-procedure vital signs reviewed and stable Respiratory status: spontaneous breathing, nonlabored ventilation, respiratory function stable and patient connected to nasal cannula oxygen Cardiovascular status: blood pressure returned to baseline and stable Postop Assessment: no signs of nausea or vomiting Anesthetic complications: no     Last Vitals:  Vitals:   04/02/17 1927 04/02/17 2005  BP: (!) 141/70 139/83  Pulse: 72   Resp: 16 16  Temp: 36.9 C 36.8 C    Last Pain:  Vitals:   04/02/17 2005  TempSrc: Temporal  PainSc: 0-No pain                 Precious Haws Nicola Quesnell

## 2017-04-02 NOTE — Anesthesia Post-op Follow-up Note (Cosign Needed)
Anesthesia QCDR form completed.        

## 2017-04-02 NOTE — Transfer of Care (Signed)
Immediate Anesthesia Transfer of Care Note  Patient: James Compton  Procedure(s) Performed: Procedure(s): KNEE ARTHROSCOPY WITH MEDIAL MENISECTOMY CHONDRAPLASTY, SYNOVECTOMY  Patient Location: PACU  Anesthesia Type:General  Level of Consciousness: sedated  Airway & Oxygen Therapy: Patient Spontanous Breathing and Patient connected to nasal cannula oxygen  Post-op Assessment: Report given to RN and Post -op Vital signs reviewed and stable  Post vital signs: Reviewed and stable  Last Vitals:  Vitals:   04/02/17 1419  BP: (!) 164/78  Pulse: 64  Resp: 14  Temp: 36.7 C    Last Pain:  Vitals:   04/02/17 1419  TempSrc: Tympanic  PainSc: 2          Complications: No apparent anesthesia complications

## 2017-04-02 NOTE — Anesthesia Preprocedure Evaluation (Signed)
Anesthesia Evaluation  Patient identified by MRN, date of birth, ID band Patient awake    Reviewed: Allergy & Precautions, H&P , NPO status , Patient's Chart, lab work & pertinent test results, reviewed documented beta blocker date and time   History of Anesthesia Complications (+) PONV and history of anesthetic complications  Airway Mallampati: III  TM Distance: >3 FB Neck ROM: full    Dental  (+) Partial Upper, Dental Advidsory Given   Pulmonary neg pulmonary ROS, former smoker,           Cardiovascular Exercise Tolerance: Good hypertension, On Medications (-) angina+ Peripheral Vascular Disease  (-) CAD, (-) Past MI, (-) Cardiac Stents and (-) CABG (-) dysrhythmias (-) Valvular Problems/Murmurs     Neuro/Psych neg Seizures TIAnegative psych ROS   GI/Hepatic GERD  ,NAFLD   Endo/Other  negative endocrine ROS  Renal/GU negative Renal ROS  negative genitourinary   Musculoskeletal   Abdominal   Peds  Hematology negative hematology ROS (+)   Anesthesia Other Findings Past Medical History: No date: Arthritis     Comment: Osteo - knees No date: Complication of anesthesia     Comment: bowels "feel asleep" after knee surgery No date: GERD (gastroesophageal reflux disease)     Comment: occas No date: PONV (postoperative nausea and vomiting) 11/03/2016: TIA (transient ischemic attack) 01/2011: Vertigo     Comment: 1 time event, pt had drank several cups of               coffee that day. No date: Wears hearing aid     Comment: bilateral No date: Wears partial dentures     Comment: upper   Reproductive/Obstetrics negative OB ROS                             Anesthesia Physical Anesthesia Plan  ASA: III  Anesthesia Plan: General   Post-op Pain Management:    Induction:   Airway Management Planned:   Additional Equipment:   Intra-op Plan:   Post-operative Plan:   Informed  Consent: I have reviewed the patients History and Physical, chart, labs and discussed the procedure including the risks, benefits and alternatives for the proposed anesthesia with the patient or authorized representative who has indicated his/her understanding and acceptance.   Dental Advisory Given  Plan Discussed with: Anesthesiologist, CRNA and Surgeon  Anesthesia Plan Comments:         Anesthesia Quick Evaluation

## 2017-04-02 NOTE — Anesthesia Procedure Notes (Signed)
Procedure Name: LMA Insertion Performed by: Ronney Honeywell Pre-anesthesia Checklist: Patient identified, Patient being monitored, Timeout performed, Emergency Drugs available and Suction available Patient Re-evaluated:Patient Re-evaluated prior to inductionOxygen Delivery Method: Circle system utilized Preoxygenation: Pre-oxygenation with 100% oxygen Intubation Type: IV induction Ventilation: Mask ventilation without difficulty LMA: LMA inserted LMA Size: 4.0 Tube type: Oral Number of attempts: 1 Placement Confirmation: positive ETCO2 and breath sounds checked- equal and bilateral Tube secured with: Tape Dental Injury: Teeth and Oropharynx as per pre-operative assessment        

## 2017-04-02 NOTE — Discharge Instructions (Signed)
AMBULATORY SURGERY  °DISCHARGE INSTRUCTIONS ° ° °1) The drugs that you were given will stay in your system until tomorrow so for the next 24 hours you should not: ° °A) Drive an automobile °B) Make any legal decisions °C) Drink any alcoholic beverage ° ° °2) You may resume regular meals tomorrow.  Today it is better to start with liquids and gradually work up to solid foods. ° °You may eat anything you prefer, but it is better to start with liquids, then soup and crackers, and gradually work up to solid foods. ° ° °3) Please notify your doctor immediately if you have any unusual bleeding, trouble breathing, redness and pain at the surgery site, drainage, fever, or pain not relieved by medication. °4)  ° °5) Your post-operative visit with Dr.                     °           °     is: Date:                        Time:   ° °Please call to schedule your post-operative visit. ° °6) Additional Instructions: ° ° ° ° ° ° ° °Instructions after Knee Arthroscopy  ° ° James P. Hooten, Jr., M.D.    ° Dept. of Orthopaedics & Sports Medicine ° [facility] ° [address] ° [place], [place]  [address] ° ° Phone: [phone]   Fax: [phone] ° ° °DIET: °• Drink plenty of non-alcoholic fluids & begin a light diet. °• Resume your normal diet the day after surgery. ° °ACTIVITY:  °• You may use crutches or a walker with weight-bearing as tolerated, unless instructed otherwise. °• You may wean yourself off of the walker or crutches as tolerated.  °• Begin doing gentle exercises. Exercising will reduce the pain and swelling, increase motion, and prevent muscle weakness.   °• Avoid strenuous activities or athletics for a minimum of 4-6 weeks after arthroscopic surgery. °• Do not drive or operate any equipment until instructed. ° °WOUND CARE:  °• Place one to two pillows under the knee the first day or two when sitting or lying.  °• Continue to use the ice packs periodically to reduce pain and swelling. °• The small incisions in  your knee are closed with nylon stitches. The stitches will be removed in the office. °• The bulky dressing may be removed on the second day after surgery. DO NOT TOUCH THE STITCHES. Put a Band-Aid over each stitch. Do NOT use any ointments or creams on the incisions.  °• You may bathe or shower after the stitches are removed at the first office visit following surgery. ° °MEDICATIONS: °• You may resume your regular medications. °• Please take the pain medication as prescribed. °• Do not take pain medication on an empty stomach. °• Do not drive or drink alcoholic beverages when taking pain medications. ° °CALL THE OFFICE FOR: °• Temperature above 101 degrees °• Excessive bleeding or drainage on the dressing. °• Excessive swelling, coldness, or paleness of the toes. °• Persistent nausea and vomiting. ° °FOLLOW-UP:  °• You should have an appointment to return to the office in 7-10 days after surgery.  °  °

## 2017-04-02 NOTE — H&P (Signed)
The patient has been re-examined, and the chart reviewed, and there have been no interval changes to the documented history and physical.    The risks, benefits, and alternatives have been discussed at length. The patient expressed understanding of the risks benefits and agreed with plans for surgical intervention.  Vara Mairena P. Jesstin Studstill, Jr. M.D.    

## 2017-04-02 NOTE — Op Note (Signed)
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   ASSISTANT: none  ANESTHESIA: general  ESTIMATED BLOOD LOSS: Minimal  TOURNIQUET TIME: Not used   DRAINS: none  IMPLANTS UTILIZED: None  INDICATIONS FOR SURGERY: James Compton is a 66 y.o. year old male who has been seen for complaints of right knee pain. MRI demonstrated findings consistent with meniscal pathology. After discussion of the risks and benefits of surgical intervention, the patient expressed understanding of the risks benefits and agree with plans for right knee arthroscopy.   PROCEDURE IN DETAIL: The patient was brought into the operating room and, after adequate general anesthesia was achieved, a tourniquet was applied to the right thigh and the leg was placed in the leg holder. All bony prominences were well padded. The patient's right knee was cleaned and prepped with alcohol and Duraprep and draped in the usual sterile fashion. A "timeout" was performed as per usual protocol. The anticipated portal sites were injected with 0.25% Marcaine with epinephrine. An anterolateral incision was made and a cannula was inserted. A large effusion was evacuated and the knee was distended with fluid using the pump. The scope was advanced down the medial gutter into the medial compartment. Under visualization with the scope, an anteromedial portal was created and a hooked probe was inserted. The medial meniscus was visualized and probed. There was a degenerative tear with a radial component noted to the posterior horn of  the medial meniscus. The tear was debrided using meniscal punches and then contoured using the ArthroCare wand. There was also a large degenerative tear to the anterior horn of the medial meniscus. The anterior tear was debrided using the ArthroCare wand. The remaining rim of meniscus was visualized and probed and felt to be stable. The articular cartilage was visualized. Grade 2-3 chondromalacia was noted throughout the medial compartment but there was a focal area of grade 4 chondromalacia on the medial tibial plateau that corresponded to the posterior tear. These areas were debrided and contoured using the ArthroCare wand.  The scope was then advanced into the intercondylar notch. The anterior cruciate ligament was visualized and probed and felt to be intact. The scope was removed from the lateral portal and reinserted via the anteromedial portal to better visualize the lateral compartment. The lateral compartment was tight and somewhat difficult to fully evaluate, although the lateral meniscus appeared to be intact without any gross tear or instability. The articular surface was in good condition. Finally, the scope was advanced so as to visualize the patellofemoral articulation. Good patellar tracking was appreciated.   The knee was irrigated with copius amounts of fluid and suctioned dry. The anterolateral portal was re-approximated with #3-0 nylon. A combination of 0.25% Marcaine with epinephrine and 4 mg of Morphine were injected via the scope. The scope was removed and the anteromedial portal was re-approximated with #3-0 nylon. A sterile dressing was applied followed by application of an ice wrap.  The patient tolerated the procedure well and was transported to the PACU in stable condition.  Glade Strausser P. Holley Bouche., M.D.

## 2017-04-03 ENCOUNTER — Encounter: Payer: Self-pay | Admitting: Orthopedic Surgery

## 2017-04-05 ENCOUNTER — Emergency Department
Admission: EM | Admit: 2017-04-05 | Discharge: 2017-04-06 | Disposition: A | Discharge status: Home / Self Care | Payer: Medicare Other | Attending: Emergency Medicine | Admitting: Emergency Medicine

## 2017-04-05 ENCOUNTER — Encounter: Payer: Self-pay | Admitting: Emergency Medicine

## 2017-04-05 ENCOUNTER — Emergency Department: Payer: Medicare Other

## 2017-04-05 DIAGNOSIS — M25561 Pain in right knee: Secondary | ICD-10-CM | POA: Insufficient documentation

## 2017-04-05 DIAGNOSIS — M25461 Effusion, right knee: Secondary | ICD-10-CM | POA: Diagnosis not present

## 2017-04-05 DIAGNOSIS — G459 Transient cerebral ischemic attack, unspecified: Secondary | ICD-10-CM | POA: Diagnosis not present

## 2017-04-05 DIAGNOSIS — I10 Essential (primary) hypertension: Secondary | ICD-10-CM | POA: Diagnosis not present

## 2017-04-05 DIAGNOSIS — R2242 Localized swelling, mass and lump, left lower limb: Secondary | ICD-10-CM | POA: Diagnosis not present

## 2017-04-05 DIAGNOSIS — L03115 Cellulitis of right lower limb: Secondary | ICD-10-CM | POA: Insufficient documentation

## 2017-04-05 DIAGNOSIS — Z7902 Long term (current) use of antithrombotics/antiplatelets: Secondary | ICD-10-CM | POA: Diagnosis not present

## 2017-04-05 DIAGNOSIS — Z87891 Personal history of nicotine dependence: Secondary | ICD-10-CM | POA: Insufficient documentation

## 2017-04-05 DIAGNOSIS — M7989 Other specified soft tissue disorders: Secondary | ICD-10-CM | POA: Diagnosis not present

## 2017-04-05 DIAGNOSIS — M79604 Pain in right leg: Secondary | ICD-10-CM | POA: Diagnosis present

## 2017-04-05 DIAGNOSIS — M25469 Effusion, unspecified knee: Secondary | ICD-10-CM

## 2017-04-05 MED ORDER — OXYCODONE-ACETAMINOPHEN 5-325 MG PO TABS
1.0000 | ORAL_TABLET | ORAL | Status: DC | PRN
  Administered 2017-04-05: 1 via ORAL

## 2017-04-05 MED ORDER — ONDANSETRON 4 MG PO TBDP
4.0000 mg | ORAL_TABLET | Freq: Once | ORAL | Status: AC | PRN
  Administered 2017-04-05: 4 mg via ORAL

## 2017-04-05 NOTE — ED Triage Notes (Signed)
Pt to triage via wc, report arthorscopic knee surgery on right knee on Wednesday, today developed swelling and pain in right calf, called orthopedic MD and instructed to come to ed for r/o dvt.

## 2017-04-05 NOTE — ED Notes (Addendum)
Pt updated on delay. Pt verbalizes understanding. Right calf is warm to touch and not firm. Color to right calf is normal.

## 2017-04-05 NOTE — ED Notes (Signed)
Pt in ultrasound

## 2017-04-06 DIAGNOSIS — M25561 Pain in right knee: Secondary | ICD-10-CM | POA: Diagnosis not present

## 2017-04-06 LAB — BASIC METABOLIC PANEL
ANION GAP: 10 (ref 5–15)
BUN: 13 mg/dL (ref 6–20)
CALCIUM: 9 mg/dL (ref 8.9–10.3)
CO2: 24 mmol/L (ref 22–32)
CREATININE: 0.71 mg/dL (ref 0.61–1.24)
Chloride: 103 mmol/L (ref 101–111)
GFR calc Af Amer: >60 mL/min (ref >60)
GLUCOSE: 127 mg/dL — AB (ref 65–99)
Potassium: 3.8 mmol/L (ref 3.5–5.1)
Sodium: 137 mmol/L (ref 135–145)

## 2017-04-06 LAB — CBC
HEMATOCRIT: 45.6 % (ref 40.0–52.0)
Hemoglobin: 16 g/dL (ref 13.0–18.0)
MCH: 30.8 pg (ref 26.0–34.0)
MCHC: 35.2 g/dL (ref 32.0–36.0)
MCV: 87.7 fL (ref 80.0–100.0)
PLATELETS: 113 10*3/uL — AB (ref 150–440)
RBC: 5.2 MIL/uL (ref 4.40–5.90)
RDW: 12.7 % (ref 11.5–14.5)
WBC: 12.8 10*3/uL — ABNORMAL HIGH (ref 3.8–10.6)

## 2017-04-06 MED ORDER — OXYCODONE-ACETAMINOPHEN 5-325 MG PO TABS: 1.0000 | ORAL_TABLET | Freq: Four times a day (QID) | ORAL | 0 refills | Status: DC | PRN

## 2017-04-06 MED ORDER — MORPHINE SULFATE (PF) 4 MG/ML IV SOLN
INTRAVENOUS | Status: AC
  Filled 2017-04-06: qty 1

## 2017-04-06 MED ORDER — ONDANSETRON HCL 4 MG/2ML IJ SOLN
INTRAMUSCULAR | Status: AC
  Filled 2017-04-06: qty 2

## 2017-04-06 MED ORDER — OXYCODONE-ACETAMINOPHEN 5-325 MG PO TABS
2.0000 | ORAL_TABLET | Freq: Once | ORAL | Status: AC
  Administered 2017-04-06: 2 via ORAL
  Filled 2017-04-06: qty 2

## 2017-04-06 MED ORDER — CLINDAMYCIN HCL 300 MG PO CAPS: 300.0000 mg | ORAL_CAPSULE | Freq: Three times a day (TID) | ORAL | 0 refills | Status: AC

## 2017-04-06 MED ORDER — MORPHINE SULFATE (PF) 4 MG/ML IV SOLN
4.0000 mg | Freq: Once | INTRAVENOUS | Status: AC
  Administered 2017-04-06: 4 mg via INTRAVENOUS

## 2017-04-06 MED ORDER — MORPHINE SULFATE (PF) 4 MG/ML IV SOLN
4.0000 mg | Freq: Once | INTRAVENOUS | Status: AC
  Administered 2017-04-06: 4 mg via INTRAVENOUS
  Filled 2017-04-06: qty 1

## 2017-04-06 MED ORDER — ONDANSETRON HCL 4 MG/2ML IJ SOLN
4.0000 mg | Freq: Once | INTRAMUSCULAR | Status: AC
  Administered 2017-04-06: 4 mg via INTRAVENOUS

## 2017-04-06 MED ORDER — CLINDAMYCIN HCL 150 MG PO CAPS
300.0000 mg | ORAL_CAPSULE | Freq: Once | ORAL | Status: AC
  Administered 2017-04-06: 300 mg via ORAL
  Filled 2017-04-06: qty 2

## 2017-04-06 NOTE — ED Notes (Signed)
Pt assisted with urinal by this RN.

## 2017-04-06 NOTE — ED Provider Notes (Signed)
[facility] Emergency Department Provider Note   ____________________________________________   First Compton Initiated Contact with Patient 04/06/17 0008     (approximate)  I have reviewed the triage vital signs and the nursing notes.   HISTORY  Chief Complaint Leg Pain    HPI James Compton is a 66 y.o. male who comes into the hospital today with some right knee pain. The patient had arthroscopic surgery approximately 4 days ago on Wednesday. He reports that ever since surgery he could not get ahead of the pain. He reports this Started getting tight and his pain persisted. The patient reports that today the pain was excruciating. Dr. Marry Guan did his procedure. He reports that he had a meniscus tear but is having some swelling going down his calf. He's had no fever but has had some nausea from the pain and denies any vomiting. The patient rates his pain a 9 out of 10 in intensity. He has been taking hydrocodone at home and Percocet was given here. He reports that his pain is not better. The patient takes Plavix and reports that he was off of it for 5 days before his procedure but restarted it the day of his procedure. The patient contacted the orthopedic surgery office today and was told to come in to get evaluated for DVT.   Past Medical History:  Diagnosis Date  . Arthritis    Osteo - knees  . Complication of anesthesia    bowels "feel asleep" after knee surgery  . GERD (gastroesophageal reflux disease)    occas  . PONV (postoperative nausea and vomiting)   . TIA (transient ischemic attack) 11/03/2016  . Vertigo 01/2011   1 time event, pt had drank several cups of coffee that day.  . Wears hearing aid    bilateral  . Wears partial dentures    upper    Patient Active Problem List   Diagnosis Date Noted  . Essential hypertension 11/04/2016  . Hyperlipidemia 11/04/2016  . Obesity 11/04/2016  . Hyperglycemia 11/04/2016  . TIA (transient ischemic attack)  11/03/2016  . Aortic ectasia, abdominal ([place]) 10/21/2016  . Encounter for hearing examination after failed hearing test 10/15/2016    Past Surgical History:  Procedure Laterality Date  . CHOLECYSTECTOMY  2007  . COLONOSCOPY N/A 04/14/2015   Procedure: COLONOSCOPY;  Surgeon: Lucilla Lame, Compton;  Location: [place];  Service: Gastroenterology;  Laterality: N/A;  . KNEE ARTHROSCOPY Right 2007  . KNEE ARTHROSCOPY WITH MEDIAL MENISECTOMY  04/02/2017   Procedure: KNEE ARTHROSCOPY WITH MEDIAL MENISECTOMY CHONDRAPLASTY, SYNOVECTOMY;  Surgeon: Dereck Leep, Compton;  Location: [facility];  Service: Orthopedics;;  . POLYPECTOMY  04/14/2015   Procedure: POLYPECTOMY INTESTINAL;  Surgeon: Lucilla Lame, Compton;  Location: [place];  Service: Gastroenterology;;    Prior to Admission medications   Medication Sig Start Date End Date Taking? Authorizing Provider  aspirin 81 MG tablet Take 81 mg by mouth daily. PM    Provider, Historical, Compton  atorvastatin (LIPITOR) 40 MG tablet Take 1 tablet (40 mg total) by mouth daily at 6 PM. 11/04/16   Theodoro Grist, Compton  clindamycin (CLEOCIN) 300 MG capsule Take 1 capsule (300 mg total) by mouth 3 (three) times daily. 04/06/17 04/16/17  Loney Hering, Compton  clopidogrel (PLAVIX) 75 MG tablet Take 1 tablet (75 mg total) by mouth daily. 11/04/16   Theodoro Grist, Compton  Coenzyme Q10 (COQ10) 100 MG CAPS Take 2 capsules by mouth daily.    Provider,  Historical, Compton  famotidine-calcium carbonate-magnesium hydroxide (PEPCID COMPLETE) 10-800-165 MG chewable tablet Chew 1 tablet by mouth daily as needed.    Provider, Historical, Compton  fluticasone (FLONASE) 50 MCG/ACT nasal spray Place 2 sprays into both nostrils daily.     Provider, Historical, Compton  gentamicin ointment (GARAMYCIN) 0.1 % Apply to affected area 3 times a day 10/31/16   Provider, Historical, Compton  HYDROcodone-acetaminophen (NORCO) 5-325 MG tablet Take 1-2 tablets by mouth every 4 (four) hours as needed for moderate  pain. 04/02/17   Hooten, Laurice Record, Compton  losartan (COZAAR) 25 MG tablet Take 1 tablet (25 mg total) by mouth daily. 11/04/16   Theodoro Grist, Compton  Multiple Vitamin (MULTIVITAMIN) capsule Take 1 capsule by mouth daily. PM    Provider, Historical, Compton  oxyCODONE-acetaminophen (ROXICET) 5-325 MG tablet Take 1 tablet by mouth every 6 (six) hours as needed. 04/06/17   Loney Hering, Compton  Probiotic Product (PROBIOTIC DAILY PO) Take 1 capsule by mouth daily. PM    Provider, Historical, Compton  traMADol (ULTRAM) 50 MG tablet Take 1 tablet (50 mg total) by mouth every 8 (eight) hours as needed for moderate pain. 01/17/17   [name], Compton    Allergies Penicillins  Family History  Problem Relation Age of Onset  . Alzheimer's disease Mother   . Diabetes Father        pre diabetic  . Healthy Sister   . Healthy Brother   . Healthy Daughter   . Healthy Son   . Healthy Daughter     Social History Social History  Substance Use Topics  . Smoking status: Former Smoker    Quit date: 11/25/1970  . Smokeless tobacco: Never Used  . Alcohol use No    Review of Systems  Constitutional: No fever/chills Eyes: No visual changes. ENT: No sore throat. Cardiovascular: Denies chest pain. Respiratory: Denies shortness of breath. Gastrointestinal: No abdominal pain.  No nausea, no vomiting.  No diarrhea.  No constipation. Genitourinary: Negative for dysuria. Musculoskeletal: Negative for back pain. Skin: Negative for rash. Neurological: Negative for headaches, focal weakness or numbness.   ____________________________________________   PHYSICAL EXAM:  VITAL SIGNS: ED Triage Vitals [04/05/17 2108]  Enc Vitals Group     BP (!) 142/84     Pulse Rate 81     Resp 20     Temp 98.7 F (37.1 C)     Temp Source Oral     SpO2 98 %     Weight 190 lb (86.2 kg)     Height 5\' 8"  (1.727 m)     Head Circumference      Peak Flow      Pain Score 10     Pain Loc      Pain Edu?      Excl. in [place]?      Constitutional: Alert and oriented. Well appearing and in Moderate distress. Eyes: Conjunctivae are normal. PERRL. EOMI. Head: Atraumatic. Nose: No congestion/rhinnorhea. Mouth/Throat: Mucous membranes are moist.  Oropharynx non-erythematous. Cardiovascular: Normal rate, regular rhythm. Grossly normal heart sounds.  Good peripheral circulation. Respiratory: Normal respiratory effort.  No retractions. Lungs CTAB. Gastrointestinal: Soft and nontender. No distention. Positive bowel sounds Musculoskeletal: Swelling to right knee with some tenderness to palpation over right knee, no tenderness to palpation of right calf compartment is soft.   Neurologic:  Normal speech and language.  Skin:  Skin is warm, dry and intact. Mild erythema to right lateral knee Psychiatric: Mood and affect are  normal.   ____________________________________________   LABS (all labs ordered are listed, but only abnormal results are displayed)  Labs Reviewed  CBC - Abnormal; Notable for the following:       Result Value   WBC 12.8 (*)    Platelets 113 (*)    All other components within normal limits  BASIC METABOLIC PANEL - Abnormal; Notable for the following:    Glucose, Bld 127 (*)    All other components within normal limits   ____________________________________________  EKG  none ____________________________________________  RADIOLOGY  US venous RLE ____________________________________________   PROCEDURES  Procedure(s) performed: None  Procedures  Critical Care performed: No  ____________________________________________   INITIAL IMPRESSION / ASSESSMENT AND PLAN / ED COURSE  Pertinent labs & imaging results that were available during my care of the patient were reviewed by me and considered in my medical decision making (see chart for details).  This is a 66 year old male who comes into the hospital today with some right knee pain and swelling. I did contact Dr. Marry Guan to  discuss the patient's case. The patient has a complex collection to the anterior aspect of the right knee. I discussed with Dr. Marry Guan the possibility of infection versus hemarthrosis. Dr. Marry Guan stated that there is a low risk of infection with arthroscopy especially given the amount of saline with which the knee was washed out. The patient did restart his Plavix so it is a high possibility that he may have some hematoma/hemarthrosis causing his swelling and pain. I did give the patient a dose of morphine and I will have him placed ice to his knee. The patient's CBC shows a white blood cell count of 12.8. I will reassess the patient.  Clinical Course as of Apr 07 327  Sat Apr 05, 2017  2340 1. No evidence of DVT within the right lower extremity. 2. 8.6 x 1.8 x 5.8 cm complex collection at the anterior aspect of the right knee, indeterminate, but suspected to be postsurgical in nature given recent history. Correlation with physical exam recommended.   US Venous Img Lower Unilateral Right [AW]    Clinical Course User Index [AW] Loney Hering, Compton   After the pain medicine the patient reports that his pain is improved. It has not gone but it is bearable. I discussed my discussion with Dr. Marry Guan. The patient reports that he was told not to take as many pills because he would run out and he states that he only has 8 pills left. I encouraged the patient to continue to ice and elevate his leg and to follow-up with Dr. Marry Guan this week. I will also give the patient clindamycin since he does have this erythema to the side of his leg to treat for possible synovitis. The patient will be discharged home to follow-up. The patient understands and agrees with the plan as stated.  ____________________________________________   FINAL CLINICAL IMPRESSION(S) / ED DIAGNOSES  Final diagnoses:  Acute pain of right knee  Knee swelling  Cellulitis of right lower extremity      NEW MEDICATIONS STARTED  DURING THIS VISIT:  New Prescriptions   CLINDAMYCIN (CLEOCIN) 300 MG CAPSULE    Take 1 capsule (300 mg total) by mouth 3 (three) times daily.   OXYCODONE-ACETAMINOPHEN (ROXICET) 5-325 MG TABLET    Take 1 tablet by mouth every 6 (six) hours as needed.     Note:  This document was prepared using Dragon voice recognition software and may include unintentional dictation errors.  Loney Hering, Compton 04/06/17 [phone]

## 2017-04-06 NOTE — Discharge Instructions (Signed)
Please follow up with Dr. Marry Guan. Please return with any other concerns

## 2017-04-07 DIAGNOSIS — M25061 Hemarthrosis, right knee: Secondary | ICD-10-CM | POA: Diagnosis not present

## 2017-04-09 DIAGNOSIS — M2391 Unspecified internal derangement of right knee: Secondary | ICD-10-CM | POA: Diagnosis not present

## 2017-04-11 DIAGNOSIS — M25061 Hemarthrosis, right knee: Secondary | ICD-10-CM | POA: Diagnosis not present

## 2017-04-11 DIAGNOSIS — Z9889 Other specified postprocedural states: Secondary | ICD-10-CM | POA: Insufficient documentation

## 2017-04-11 HISTORY — DX: Other specified postprocedural states: Z98.890

## 2017-04-24 DIAGNOSIS — M25561 Pain in right knee: Secondary | ICD-10-CM | POA: Diagnosis not present

## 2017-04-28 DIAGNOSIS — M6281 Muscle weakness (generalized): Secondary | ICD-10-CM | POA: Diagnosis not present

## 2017-04-28 DIAGNOSIS — Z9889 Other specified postprocedural states: Secondary | ICD-10-CM | POA: Diagnosis not present

## 2017-04-28 DIAGNOSIS — M25562 Pain in left knee: Secondary | ICD-10-CM | POA: Diagnosis not present

## 2017-04-28 DIAGNOSIS — M25661 Stiffness of right knee, not elsewhere classified: Secondary | ICD-10-CM | POA: Diagnosis not present

## 2017-04-30 ENCOUNTER — Other Ambulatory Visit: Payer: Self-pay | Admitting: Family Medicine

## 2017-04-30 DIAGNOSIS — M6281 Muscle weakness (generalized): Secondary | ICD-10-CM | POA: Diagnosis not present

## 2017-04-30 DIAGNOSIS — M25561 Pain in right knee: Secondary | ICD-10-CM | POA: Diagnosis not present

## 2017-04-30 DIAGNOSIS — M25661 Stiffness of right knee, not elsewhere classified: Secondary | ICD-10-CM | POA: Diagnosis not present

## 2017-04-30 DIAGNOSIS — Z9889 Other specified postprocedural states: Secondary | ICD-10-CM | POA: Diagnosis not present

## 2017-04-30 MED ORDER — ATORVASTATIN CALCIUM 40 MG PO TABS: 40.0000 mg | ORAL_TABLET | Freq: Every day | ORAL | 12 refills | Status: DC

## 2017-04-30 NOTE — Telephone Encounter (Signed)
Pt contacted office for refill request on the following medications:  [facility].  CB#[phone]/MW  atorvastatin (LIPITOR) 40 MG tablet  clopidogrel (PLAVIX) 75 MG tablet  These were given to the pt while he was in the hospital and pt is asking if he will need to continue these?

## 2017-04-30 NOTE — Telephone Encounter (Signed)
Please advise 

## 2017-05-02 ENCOUNTER — Other Ambulatory Visit: Payer: Self-pay | Admitting: Family Medicine

## 2017-05-02 DIAGNOSIS — M6281 Muscle weakness (generalized): Secondary | ICD-10-CM | POA: Diagnosis not present

## 2017-05-02 DIAGNOSIS — M25561 Pain in right knee: Secondary | ICD-10-CM | POA: Diagnosis not present

## 2017-05-02 DIAGNOSIS — M25661 Stiffness of right knee, not elsewhere classified: Secondary | ICD-10-CM | POA: Diagnosis not present

## 2017-05-02 DIAGNOSIS — Z9889 Other specified postprocedural states: Secondary | ICD-10-CM | POA: Diagnosis not present

## 2017-05-02 MED ORDER — LOSARTAN POTASSIUM 25 MG PO TABS: 25.0000 mg | ORAL_TABLET | Freq: Every day | ORAL | 4 refills | Status: DC

## 2017-05-02 NOTE — Telephone Encounter (Signed)
Pt contacted office for refill request on the following medications:  losartan (COZAAR) 25 MG tablet.  90 day supply.  [facility].  CB#[phone]/MW

## 2017-05-02 NOTE — Telephone Encounter (Signed)
Requesting 90 day supply.

## 2017-05-05 DIAGNOSIS — Z9889 Other specified postprocedural states: Secondary | ICD-10-CM | POA: Diagnosis not present

## 2017-05-05 DIAGNOSIS — M25661 Stiffness of right knee, not elsewhere classified: Secondary | ICD-10-CM | POA: Diagnosis not present

## 2017-05-05 DIAGNOSIS — M6281 Muscle weakness (generalized): Secondary | ICD-10-CM | POA: Diagnosis not present

## 2017-05-05 DIAGNOSIS — M25561 Pain in right knee: Secondary | ICD-10-CM | POA: Diagnosis not present

## 2017-05-07 ENCOUNTER — Telehealth: Payer: Self-pay

## 2017-05-07 DIAGNOSIS — Z9889 Other specified postprocedural states: Secondary | ICD-10-CM | POA: Diagnosis not present

## 2017-05-07 DIAGNOSIS — M25561 Pain in right knee: Secondary | ICD-10-CM | POA: Diagnosis not present

## 2017-05-07 DIAGNOSIS — M25661 Stiffness of right knee, not elsewhere classified: Secondary | ICD-10-CM | POA: Diagnosis not present

## 2017-05-07 DIAGNOSIS — M6281 Muscle weakness (generalized): Secondary | ICD-10-CM | POA: Diagnosis not present

## 2017-05-07 MED ORDER — CLOPIDOGREL BISULFATE 75 MG PO TABS: 75.0000 mg | ORAL_TABLET | Freq: Every day | ORAL | 11 refills | Status: DC

## 2017-05-07 NOTE — Telephone Encounter (Signed)
Patient called office to request a refill on his Plavix prescription. Patient states that her had a TIA and was ordinally prescribed this medication by doctor at hospital but was unsure if Dr. Caryn Section was going to continue patient on medication. Patient request that if Dr. Caryn Section does fill prescription [facility] [address] KW

## 2017-05-07 NOTE — Telephone Encounter (Signed)
Please advise 

## 2017-05-09 DIAGNOSIS — M25661 Stiffness of right knee, not elsewhere classified: Secondary | ICD-10-CM | POA: Diagnosis not present

## 2017-05-09 DIAGNOSIS — M25561 Pain in right knee: Secondary | ICD-10-CM | POA: Diagnosis not present

## 2017-05-09 DIAGNOSIS — M6281 Muscle weakness (generalized): Secondary | ICD-10-CM | POA: Diagnosis not present

## 2017-05-09 DIAGNOSIS — Z9889 Other specified postprocedural states: Secondary | ICD-10-CM | POA: Diagnosis not present

## 2017-05-12 DIAGNOSIS — M25661 Stiffness of right knee, not elsewhere classified: Secondary | ICD-10-CM | POA: Diagnosis not present

## 2017-05-12 DIAGNOSIS — M25561 Pain in right knee: Secondary | ICD-10-CM | POA: Diagnosis not present

## 2017-05-12 DIAGNOSIS — M6281 Muscle weakness (generalized): Secondary | ICD-10-CM | POA: Diagnosis not present

## 2017-05-12 DIAGNOSIS — Z9889 Other specified postprocedural states: Secondary | ICD-10-CM | POA: Diagnosis not present

## 2017-05-14 DIAGNOSIS — M6281 Muscle weakness (generalized): Secondary | ICD-10-CM | POA: Diagnosis not present

## 2017-05-15 DIAGNOSIS — G8929 Other chronic pain: Secondary | ICD-10-CM | POA: Diagnosis not present

## 2017-05-15 DIAGNOSIS — M25561 Pain in right knee: Secondary | ICD-10-CM | POA: Diagnosis not present

## 2017-05-16 DIAGNOSIS — M6281 Muscle weakness (generalized): Secondary | ICD-10-CM | POA: Diagnosis not present

## 2017-05-16 DIAGNOSIS — M25661 Stiffness of right knee, not elsewhere classified: Secondary | ICD-10-CM | POA: Diagnosis not present

## 2017-05-16 DIAGNOSIS — Z9889 Other specified postprocedural states: Secondary | ICD-10-CM | POA: Diagnosis not present

## 2017-05-16 DIAGNOSIS — M25561 Pain in right knee: Secondary | ICD-10-CM | POA: Diagnosis not present

## 2017-05-19 DIAGNOSIS — M6281 Muscle weakness (generalized): Secondary | ICD-10-CM | POA: Diagnosis not present

## 2017-05-21 DIAGNOSIS — M6281 Muscle weakness (generalized): Secondary | ICD-10-CM | POA: Diagnosis not present

## 2017-05-21 DIAGNOSIS — M25661 Stiffness of right knee, not elsewhere classified: Secondary | ICD-10-CM | POA: Diagnosis not present

## 2017-05-21 DIAGNOSIS — Z9889 Other specified postprocedural states: Secondary | ICD-10-CM | POA: Diagnosis not present

## 2017-05-21 DIAGNOSIS — M25561 Pain in right knee: Secondary | ICD-10-CM | POA: Diagnosis not present

## 2017-05-23 DIAGNOSIS — M6281 Muscle weakness (generalized): Secondary | ICD-10-CM | POA: Diagnosis not present

## 2017-05-26 DIAGNOSIS — Z9889 Other specified postprocedural states: Secondary | ICD-10-CM | POA: Diagnosis not present

## 2017-05-26 DIAGNOSIS — M25561 Pain in right knee: Secondary | ICD-10-CM | POA: Diagnosis not present

## 2017-05-26 DIAGNOSIS — M6281 Muscle weakness (generalized): Secondary | ICD-10-CM | POA: Diagnosis not present

## 2017-05-26 DIAGNOSIS — M25661 Stiffness of right knee, not elsewhere classified: Secondary | ICD-10-CM | POA: Diagnosis not present

## 2017-05-30 DIAGNOSIS — Z9889 Other specified postprocedural states: Secondary | ICD-10-CM | POA: Diagnosis not present

## 2017-05-30 DIAGNOSIS — M25661 Stiffness of right knee, not elsewhere classified: Secondary | ICD-10-CM | POA: Diagnosis not present

## 2017-05-30 DIAGNOSIS — M25561 Pain in right knee: Secondary | ICD-10-CM | POA: Diagnosis not present

## 2017-05-30 DIAGNOSIS — M6281 Muscle weakness (generalized): Secondary | ICD-10-CM | POA: Diagnosis not present

## 2017-06-02 DIAGNOSIS — M6281 Muscle weakness (generalized): Secondary | ICD-10-CM | POA: Diagnosis not present

## 2017-06-04 DIAGNOSIS — Z9889 Other specified postprocedural states: Secondary | ICD-10-CM | POA: Diagnosis not present

## 2017-06-04 DIAGNOSIS — M25661 Stiffness of right knee, not elsewhere classified: Secondary | ICD-10-CM | POA: Diagnosis not present

## 2017-06-04 DIAGNOSIS — M25561 Pain in right knee: Secondary | ICD-10-CM | POA: Diagnosis not present

## 2017-06-04 DIAGNOSIS — M6281 Muscle weakness (generalized): Secondary | ICD-10-CM | POA: Diagnosis not present

## 2017-06-06 DIAGNOSIS — M6281 Muscle weakness (generalized): Secondary | ICD-10-CM | POA: Diagnosis not present

## 2017-06-09 DIAGNOSIS — Z9889 Other specified postprocedural states: Secondary | ICD-10-CM | POA: Diagnosis not present

## 2017-06-09 DIAGNOSIS — M25661 Stiffness of right knee, not elsewhere classified: Secondary | ICD-10-CM | POA: Diagnosis not present

## 2017-06-09 DIAGNOSIS — M25561 Pain in right knee: Secondary | ICD-10-CM | POA: Diagnosis not present

## 2017-06-09 DIAGNOSIS — M6281 Muscle weakness (generalized): Secondary | ICD-10-CM | POA: Diagnosis not present

## 2017-06-11 DIAGNOSIS — M25561 Pain in right knee: Secondary | ICD-10-CM | POA: Diagnosis not present

## 2017-06-11 DIAGNOSIS — Z9889 Other specified postprocedural states: Secondary | ICD-10-CM | POA: Diagnosis not present

## 2017-06-11 DIAGNOSIS — M6281 Muscle weakness (generalized): Secondary | ICD-10-CM | POA: Diagnosis not present

## 2017-06-11 DIAGNOSIS — M25661 Stiffness of right knee, not elsewhere classified: Secondary | ICD-10-CM | POA: Diagnosis not present

## 2017-06-13 DIAGNOSIS — M25561 Pain in right knee: Secondary | ICD-10-CM | POA: Diagnosis not present

## 2017-06-13 DIAGNOSIS — M6281 Muscle weakness (generalized): Secondary | ICD-10-CM | POA: Diagnosis not present

## 2017-06-13 DIAGNOSIS — M25661 Stiffness of right knee, not elsewhere classified: Secondary | ICD-10-CM | POA: Diagnosis not present

## 2017-06-16 DIAGNOSIS — Z9889 Other specified postprocedural states: Secondary | ICD-10-CM | POA: Diagnosis not present

## 2017-06-16 DIAGNOSIS — M25561 Pain in right knee: Secondary | ICD-10-CM | POA: Diagnosis not present

## 2017-06-16 DIAGNOSIS — M25661 Stiffness of right knee, not elsewhere classified: Secondary | ICD-10-CM | POA: Diagnosis not present

## 2017-06-16 DIAGNOSIS — M6281 Muscle weakness (generalized): Secondary | ICD-10-CM | POA: Diagnosis not present

## 2017-06-18 DIAGNOSIS — M25661 Stiffness of right knee, not elsewhere classified: Secondary | ICD-10-CM | POA: Diagnosis not present

## 2017-06-18 DIAGNOSIS — Z9889 Other specified postprocedural states: Secondary | ICD-10-CM | POA: Diagnosis not present

## 2017-06-18 DIAGNOSIS — M6281 Muscle weakness (generalized): Secondary | ICD-10-CM | POA: Diagnosis not present

## 2017-06-18 DIAGNOSIS — M25561 Pain in right knee: Secondary | ICD-10-CM | POA: Diagnosis not present

## 2017-06-19 DIAGNOSIS — H11153 Pinguecula, bilateral: Secondary | ICD-10-CM | POA: Diagnosis not present

## 2017-06-19 DIAGNOSIS — H524 Presbyopia: Secondary | ICD-10-CM | POA: Diagnosis not present

## 2017-06-19 DIAGNOSIS — H43813 Vitreous degeneration, bilateral: Secondary | ICD-10-CM | POA: Diagnosis not present

## 2017-06-19 DIAGNOSIS — H2513 Age-related nuclear cataract, bilateral: Secondary | ICD-10-CM | POA: Diagnosis not present

## 2017-06-20 DIAGNOSIS — M25661 Stiffness of right knee, not elsewhere classified: Secondary | ICD-10-CM | POA: Diagnosis not present

## 2017-06-20 DIAGNOSIS — Z9889 Other specified postprocedural states: Secondary | ICD-10-CM | POA: Diagnosis not present

## 2017-06-20 DIAGNOSIS — M6281 Muscle weakness (generalized): Secondary | ICD-10-CM | POA: Diagnosis not present

## 2017-06-20 DIAGNOSIS — M25561 Pain in right knee: Secondary | ICD-10-CM | POA: Diagnosis not present

## 2017-06-23 DIAGNOSIS — M25561 Pain in right knee: Secondary | ICD-10-CM | POA: Diagnosis not present

## 2017-06-23 DIAGNOSIS — M6281 Muscle weakness (generalized): Secondary | ICD-10-CM | POA: Diagnosis not present

## 2017-06-23 DIAGNOSIS — M25661 Stiffness of right knee, not elsewhere classified: Secondary | ICD-10-CM | POA: Diagnosis not present

## 2017-06-23 DIAGNOSIS — Z9889 Other specified postprocedural states: Secondary | ICD-10-CM | POA: Diagnosis not present

## 2017-06-25 DIAGNOSIS — M6281 Muscle weakness (generalized): Secondary | ICD-10-CM | POA: Diagnosis not present

## 2017-06-27 DIAGNOSIS — M6281 Muscle weakness (generalized): Secondary | ICD-10-CM | POA: Diagnosis not present

## 2017-06-27 DIAGNOSIS — Z9889 Other specified postprocedural states: Secondary | ICD-10-CM | POA: Diagnosis not present

## 2017-06-27 DIAGNOSIS — M25661 Stiffness of right knee, not elsewhere classified: Secondary | ICD-10-CM | POA: Diagnosis not present

## 2017-06-27 DIAGNOSIS — M25561 Pain in right knee: Secondary | ICD-10-CM | POA: Diagnosis not present

## 2017-06-30 DIAGNOSIS — M25561 Pain in right knee: Secondary | ICD-10-CM | POA: Diagnosis not present

## 2017-06-30 DIAGNOSIS — Z9889 Other specified postprocedural states: Secondary | ICD-10-CM | POA: Diagnosis not present

## 2017-06-30 DIAGNOSIS — M6281 Muscle weakness (generalized): Secondary | ICD-10-CM | POA: Diagnosis not present

## 2017-06-30 DIAGNOSIS — M25661 Stiffness of right knee, not elsewhere classified: Secondary | ICD-10-CM | POA: Diagnosis not present

## 2017-07-02 DIAGNOSIS — M6281 Muscle weakness (generalized): Secondary | ICD-10-CM | POA: Diagnosis not present

## 2017-07-02 DIAGNOSIS — M25661 Stiffness of right knee, not elsewhere classified: Secondary | ICD-10-CM | POA: Diagnosis not present

## 2017-07-02 DIAGNOSIS — Z9889 Other specified postprocedural states: Secondary | ICD-10-CM | POA: Diagnosis not present

## 2017-07-04 ENCOUNTER — Encounter: Payer: Self-pay | Admitting: Family Medicine

## 2017-07-04 ENCOUNTER — Ambulatory Visit (INDEPENDENT_AMBULATORY_CARE_PROVIDER_SITE_OTHER): Payer: Medicare Other | Admitting: Family Medicine

## 2017-07-04 VITALS — BP 130/80 | HR 72 | Temp 98.6°F | Resp 16 | Ht 68.0 in | Wt 192.0 lb

## 2017-07-04 DIAGNOSIS — E785 Hyperlipidemia, unspecified: Secondary | ICD-10-CM | POA: Diagnosis not present

## 2017-07-04 DIAGNOSIS — I10 Essential (primary) hypertension: Secondary | ICD-10-CM | POA: Diagnosis not present

## 2017-07-04 DIAGNOSIS — M25561 Pain in right knee: Secondary | ICD-10-CM | POA: Diagnosis not present

## 2017-07-04 DIAGNOSIS — Z9889 Other specified postprocedural states: Secondary | ICD-10-CM | POA: Diagnosis not present

## 2017-07-04 DIAGNOSIS — M25661 Stiffness of right knee, not elsewhere classified: Secondary | ICD-10-CM | POA: Diagnosis not present

## 2017-07-04 DIAGNOSIS — M6281 Muscle weakness (generalized): Secondary | ICD-10-CM | POA: Diagnosis not present

## 2017-07-04 DIAGNOSIS — G458 Other transient cerebral ischemic attacks and related syndromes: Secondary | ICD-10-CM | POA: Diagnosis not present

## 2017-07-04 MED ORDER — TRAMADOL HCL 50 MG PO TABS: 50.0000 mg | ORAL_TABLET | Freq: Three times a day (TID) | ORAL | 2 refills | Status: DC | PRN

## 2017-07-04 NOTE — Progress Notes (Signed)
Patient: James Compton Male    DOB: 12-24-1950   66 y.o.   MRN: [id number] Visit Date: 07/04/2017  Today's Provider: Lelon Huh, MD   Chief Complaint  Patient presents with  . Follow-up  . Hyperlipidemia  . Transient Ischemic Attack   Subjective:    HPI   Lipid/Cholesterol, Follow-up:   Last seen for this 6 months ago.  Management since that visit includes; advised to try Coenzyme Q10 200 once a day for a month. If still having fatigue or muscle we can try changing to a different statin.   Last Lipid Panel:    Component Value Date/Time   CHOL 164 11/04/2016 0351   CHOL 190 10/21/2016 0807   TRIG 190 (H) 11/04/2016 0351   HDL 37 (L) 11/04/2016 0351   HDL 42 10/21/2016 0807   CHOLHDL 4.4 11/04/2016 0351   VLDL 38 11/04/2016 0351   LDLCALC 89 11/04/2016 0351   LDLCALC 120 (H) 10/21/2016 0807    He reports good compliance with treatment. He is not having side effects. none  Wt Readings from Last 3 Encounters:  07/04/17 192 lb (87.1 kg)  04/05/17 190 lb (86.2 kg)  04/02/17 191 lb (86.6 kg)    ----------------------------------------------------------------  Follow up TIA From 01/03/2017-No changes.    Allergies  Allergen Reactions  . Penicillins Nausea Only     Current Outpatient Prescriptions:  .  aspirin 81 MG tablet, Take 81 mg by mouth daily. PM, Disp: , Rfl:  .  atorvastatin (LIPITOR) 40 MG tablet, Take 1 tablet (40 mg total) by mouth daily at 6 PM., Disp: 30 tablet, Rfl: 12 .  clopidogrel (PLAVIX) 75 MG tablet, Take 1 tablet (75 mg total) by mouth daily., Disp: 30 tablet, Rfl: 11 .  Coenzyme Q10 (COQ10) 100 MG CAPS, Take 2 capsules by mouth daily., Disp: , Rfl:  .  famotidine-calcium carbonate-magnesium hydroxide (PEPCID COMPLETE) 10-800-165 MG chewable tablet, Chew 1 tablet by mouth daily as needed., Disp: , Rfl:  .  fluticasone (FLONASE) 50 MCG/ACT nasal spray, Place 2 sprays into both nostrils daily. , Disp: , Rfl:  .  gentamicin ointment  (GARAMYCIN) 0.1 %, Apply to affected area 3 times a day, Disp: , Rfl:  .  losartan (COZAAR) 25 MG tablet, Take 1 tablet (25 mg total) by mouth daily., Disp: 90 tablet, Rfl: 4 .  Multiple Vitamin (MULTIVITAMIN) capsule, Take 1 capsule by mouth daily. PM, Disp: , Rfl:  .  Probiotic Product (PROBIOTIC DAILY PO), Take 1 capsule by mouth daily. PM, Disp: , Rfl:  .  traMADol (ULTRAM) 50 MG tablet, Take 1 tablet (50 mg total) by mouth every 8 (eight) hours as needed for moderate pain., Disp: 30 tablet, Rfl: 1  Review of Systems  Constitutional: Negative for appetite change, chills and fever.  Respiratory: Negative for chest tightness, shortness of breath and wheezing.   Cardiovascular: Negative for chest pain and palpitations.  Gastrointestinal: Negative for abdominal pain, nausea and vomiting.    Social History  Substance Use Topics  . Smoking status: Former Smoker    Quit date: 11/25/1970  . Smokeless tobacco: Never Used  . Alcohol use No   Objective:   BP 130/80 (BP Location: Right Arm, Patient Position: Sitting, Cuff Size: Large)   Pulse 72   Temp 98.6 F (37 C) (Oral)   Resp 16   Ht 5\' 8"  (1.727 m)   Wt 192 lb (87.1 kg)   SpO2 95%   BMI 29.19  kg/m     Physical Exam   General Appearance:    Alert, cooperative, no distress  Eyes:    PERRL, conjunctiva/corneas clear, EOM's intact       Lungs:     Clear to auscultation bilaterally, respirations unlabored  Heart:    Regular rate and rhythm  Neurologic:   Awake, alert, oriented x 3. No apparent focal neurological           defect.           Assessment & Plan:     1. Other specified transient cerebral ischemias Asymptomatic since initial episode in December. Continue current medications.  2. Essential hypertension Well controlled.   - Comprehensive metabolic panel  3. Hyperlipidemia, unspecified hyperlipidemia type He is tolerating atorvastatin well with no adverse effects.   - Lipid panel - Comprehensive metabolic  panel     The entirety of the information documented in the History of Present Illness, Review of Systems and Physical Exam were personally obtained by me. Portions of this information were initially documented by April M. Sabra Heck, CMA and reviewed by me for thoroughness and accuracy.    Lelon Huh, MD  [facility]

## 2017-07-07 DIAGNOSIS — E785 Hyperlipidemia, unspecified: Secondary | ICD-10-CM | POA: Diagnosis not present

## 2017-07-07 DIAGNOSIS — M6281 Muscle weakness (generalized): Secondary | ICD-10-CM | POA: Diagnosis not present

## 2017-07-07 DIAGNOSIS — I10 Essential (primary) hypertension: Secondary | ICD-10-CM | POA: Diagnosis not present

## 2017-07-08 LAB — COMPREHENSIVE METABOLIC PANEL
A/G RATIO: 1.8 (ref 1.2–2.2)
ALT: 10 IU/L (ref 0–44)
AST: 21 IU/L (ref 0–40)
Albumin: 4.4 g/dL (ref 3.6–4.8)
Alkaline Phosphatase: 128 IU/L — ABNORMAL HIGH (ref 39–117)
BUN / CREAT RATIO: 14 (ref 10–24)
BUN: 10 mg/dL (ref 8–27)
Bilirubin Total: 1.2 mg/dL (ref 0.0–1.2)
CALCIUM: 9.5 mg/dL (ref 8.6–10.2)
CO2: 21 mmol/L (ref 20–29)
Chloride: 102 mmol/L (ref 96–106)
Creatinine, Ser: 0.71 mg/dL — ABNORMAL LOW (ref 0.76–1.27)
GFR, EST AFRICAN AMERICAN: 111 mL/min/{1.73_m2} (ref >59)
GFR, EST NON AFRICAN AMERICAN: 96 mL/min/{1.73_m2} (ref >59)
GLOBULIN, TOTAL: 2.5 g/dL (ref 1.5–4.5)
Glucose: 96 mg/dL (ref 65–99)
Potassium: 4.3 mmol/L (ref 3.5–5.2)
SODIUM: 140 mmol/L (ref 134–144)
TOTAL PROTEIN: 6.9 g/dL (ref 6.0–8.5)

## 2017-07-08 LAB — LIPID PANEL
CHOLESTEROL TOTAL: 95 mg/dL — AB (ref 100–199)
Chol/HDL Ratio: 2.4 ratio (ref 0.0–5.0)
HDL: 39 mg/dL — ABNORMAL LOW (ref >39)
LDL CALC: 30 mg/dL (ref 0–99)
TRIGLYCERIDES: 129 mg/dL (ref 0–149)
VLDL Cholesterol Cal: 26 mg/dL (ref 5–40)

## 2017-07-09 DIAGNOSIS — M6281 Muscle weakness (generalized): Secondary | ICD-10-CM | POA: Diagnosis not present

## 2017-07-11 DIAGNOSIS — M6281 Muscle weakness (generalized): Secondary | ICD-10-CM | POA: Diagnosis not present

## 2017-07-14 DIAGNOSIS — M6281 Muscle weakness (generalized): Secondary | ICD-10-CM | POA: Diagnosis not present

## 2017-07-16 DIAGNOSIS — M25561 Pain in right knee: Secondary | ICD-10-CM | POA: Diagnosis not present

## 2017-07-16 DIAGNOSIS — Z9889 Other specified postprocedural states: Secondary | ICD-10-CM | POA: Diagnosis not present

## 2017-07-16 DIAGNOSIS — M6281 Muscle weakness (generalized): Secondary | ICD-10-CM | POA: Diagnosis not present

## 2017-07-16 DIAGNOSIS — M25661 Stiffness of right knee, not elsewhere classified: Secondary | ICD-10-CM | POA: Diagnosis not present

## 2017-07-21 DIAGNOSIS — M25661 Stiffness of right knee, not elsewhere classified: Secondary | ICD-10-CM | POA: Diagnosis not present

## 2017-07-21 DIAGNOSIS — M25561 Pain in right knee: Secondary | ICD-10-CM | POA: Diagnosis not present

## 2017-07-21 DIAGNOSIS — M6281 Muscle weakness (generalized): Secondary | ICD-10-CM | POA: Diagnosis not present

## 2017-07-21 DIAGNOSIS — Z9889 Other specified postprocedural states: Secondary | ICD-10-CM | POA: Diagnosis not present

## 2017-07-23 DIAGNOSIS — Z9889 Other specified postprocedural states: Secondary | ICD-10-CM | POA: Diagnosis not present

## 2017-07-23 DIAGNOSIS — M25561 Pain in right knee: Secondary | ICD-10-CM | POA: Diagnosis not present

## 2017-07-23 DIAGNOSIS — M25661 Stiffness of right knee, not elsewhere classified: Secondary | ICD-10-CM | POA: Diagnosis not present

## 2017-07-23 DIAGNOSIS — M6281 Muscle weakness (generalized): Secondary | ICD-10-CM | POA: Diagnosis not present

## 2017-08-26 DIAGNOSIS — Z23 Encounter for immunization: Secondary | ICD-10-CM | POA: Diagnosis not present

## 2017-08-28 DIAGNOSIS — M170 Bilateral primary osteoarthritis of knee: Secondary | ICD-10-CM | POA: Diagnosis not present

## 2017-08-28 DIAGNOSIS — M25562 Pain in left knee: Secondary | ICD-10-CM | POA: Diagnosis not present

## 2017-08-28 DIAGNOSIS — M25561 Pain in right knee: Secondary | ICD-10-CM | POA: Diagnosis not present

## 2017-09-02 DIAGNOSIS — Z9889 Other specified postprocedural states: Secondary | ICD-10-CM | POA: Diagnosis not present

## 2017-09-02 DIAGNOSIS — M25562 Pain in left knee: Secondary | ICD-10-CM | POA: Diagnosis not present

## 2017-09-02 DIAGNOSIS — M1712 Unilateral primary osteoarthritis, left knee: Secondary | ICD-10-CM | POA: Diagnosis not present

## 2017-09-04 DIAGNOSIS — M25561 Pain in right knee: Secondary | ICD-10-CM | POA: Diagnosis not present

## 2017-09-04 DIAGNOSIS — M1711 Unilateral primary osteoarthritis, right knee: Secondary | ICD-10-CM | POA: Diagnosis not present

## 2017-09-09 DIAGNOSIS — M25562 Pain in left knee: Secondary | ICD-10-CM | POA: Diagnosis not present

## 2017-09-09 DIAGNOSIS — M1712 Unilateral primary osteoarthritis, left knee: Secondary | ICD-10-CM | POA: Diagnosis not present

## 2017-09-11 DIAGNOSIS — M1711 Unilateral primary osteoarthritis, right knee: Secondary | ICD-10-CM | POA: Diagnosis not present

## 2017-09-11 DIAGNOSIS — M25561 Pain in right knee: Secondary | ICD-10-CM | POA: Diagnosis not present

## 2017-09-16 DIAGNOSIS — M25562 Pain in left knee: Secondary | ICD-10-CM | POA: Diagnosis not present

## 2017-09-16 DIAGNOSIS — M1712 Unilateral primary osteoarthritis, left knee: Secondary | ICD-10-CM | POA: Diagnosis not present

## 2017-09-18 DIAGNOSIS — M25561 Pain in right knee: Secondary | ICD-10-CM | POA: Diagnosis not present

## 2017-09-18 DIAGNOSIS — M1711 Unilateral primary osteoarthritis, right knee: Secondary | ICD-10-CM | POA: Diagnosis not present

## 2017-09-23 DIAGNOSIS — M25562 Pain in left knee: Secondary | ICD-10-CM | POA: Diagnosis not present

## 2017-09-23 DIAGNOSIS — M170 Bilateral primary osteoarthritis of knee: Secondary | ICD-10-CM | POA: Diagnosis not present

## 2017-09-23 DIAGNOSIS — M25561 Pain in right knee: Secondary | ICD-10-CM | POA: Diagnosis not present

## 2017-10-31 ENCOUNTER — Ambulatory Visit (INDEPENDENT_AMBULATORY_CARE_PROVIDER_SITE_OTHER): Payer: Medicare Other

## 2017-10-31 VITALS — BP 140/64 | HR 72 | Temp 98.2°F | Ht 68.0 in | Wt 197.2 lb

## 2017-10-31 DIAGNOSIS — Z0000 Encounter for general adult medical examination without abnormal findings: Secondary | ICD-10-CM | POA: Diagnosis not present

## 2017-10-31 NOTE — Progress Notes (Signed)
Subjective:   James Compton is a 66 y.o. male who presents for Medicare Annual/Subsequent preventive examination.  Review of Systems:  N/A  Cardiac Risk Factors include: advanced age (>86men, >2 women);hypertension;dyslipidemia     Objective:    Vitals: BP 140/64 (BP Location: Left Arm)   Pulse 72   Temp 98.2 F (36.8 C) (Oral)   Ht 5\' 8"  (1.727 m)   Wt 197 lb 3.2 oz (89.4 kg)   BMI 29.98 kg/m   Body mass index is 29.98 kg/m.  Advanced Directives 10/31/2017 04/05/2017 04/02/2017 03/17/2017 11/03/2016 11/03/2016  Does Patient Have a Medical Advance Directive? Yes Yes Yes Yes No No  Type of Paramedic of [place];Living will [place];Living will [place];Living will [place];Living will - -  Does patient want to make changes to medical advance directive? - No - Patient declined - No - Patient declined - -  Copy of [place] in Chart? Yes Yes Yes Yes - -  Would patient like information on creating a medical advance directive? - - - - No - Patient declined No - Patient declined  Pre-existing out of facility DNR order (yellow form or pink MOST form) (No Data) - - - - -    Tobacco Social History   Tobacco Use  Smoking Status Former Smoker  . Last attempt to quit: 11/25/1970  . Years since quitting: 46.9  Smokeless Tobacco Never Used     Counseling given: Not Answered   Clinical Intake:  Pre-visit preparation completed: Yes  Pain : No/denies pain Pain Score: 0-No pain     Nutritional Status: BMI 25 -29 Overweight Nutritional Risks: None Diabetes: No  Activities of Daily Living: Independent Ambulation: Independent with device- listed below Home Assistive Devices/Equipment: Eyeglasses, Dentures (specify type)(wears partial dentures on top ) Medication Administration: Independent Home Management: Independent  Barriers to Care Management & Learning: None  Do you feel  unsafe in your current relationship?: No(married) Do you feel physically threatened by others?: No Anyone hurting you at home, work, or school?: No Unable to ask?: No Information provided on Community resources: No  How often do you need to have someone help you when you read instructions, pamphlets, or other written materials from your doctor or pharmacy?: 1 - Never What is the last grade level you completed in school?: some college  Interpreter Needed?: No  Information entered by :: [facility], LPN  Past Medical History:  Diagnosis Date  . Arthritis    Osteo - knees  . Complication of anesthesia    bowels "feel asleep" after knee surgery  . GERD (gastroesophageal reflux disease)    occas  . PONV (postoperative nausea and vomiting)   . TIA (transient ischemic attack) 11/03/2016  . Vertigo 01/2011   1 time event, pt had drank several cups of coffee that day.  . Wears hearing aid    bilateral  . Wears partial dentures    upper   Past Surgical History:  Procedure Laterality Date  . CHOLECYSTECTOMY  2007  . COLONOSCOPY N/A 04/14/2015   Procedure: COLONOSCOPY;  Surgeon: Lucilla Lame, MD;  Location: [facility];  Service: Gastroenterology;  Laterality: N/A;  . KNEE ARTHROSCOPY Right 2007  . KNEE ARTHROSCOPY WITH MEDIAL MENISECTOMY  04/02/2017   Procedure: KNEE ARTHROSCOPY WITH MEDIAL MENISECTOMY CHONDRAPLASTY, SYNOVECTOMY;  Surgeon: Dereck Leep, MD;  Location: [facility];  Service: Orthopedics;;  . POLYPECTOMY  04/14/2015   Procedure: POLYPECTOMY  INTESTINAL;  Surgeon: Lucilla Lame, MD;  Location: [facility];  Service: Gastroenterology;;   Family History  Problem Relation Age of Onset  . Alzheimer's disease Mother   . Diabetes Father        pre diabetic  . Healthy Sister   . Healthy Brother   . Healthy Daughter   . Healthy Son   . Healthy Daughter    Social History   Socioeconomic History  . Marital status: Married    Spouse name: None  . Number of  children: None  . Years of education: None  . Highest education level: None  Social Needs  . Financial resource strain: None  . Food insecurity - worry: None  . Food insecurity - inability: None  . Transportation needs - medical: None  . Transportation needs - non-medical: None  Occupational History  . None  Tobacco Use  . Smoking status: Former Smoker    Last attempt to quit: 11/25/1970    Years since quitting: 46.9  . Smokeless tobacco: Never Used  Substance and Sexual Activity  . Alcohol use: No  . Drug use: No  . Sexual activity: None  Other Topics Concern  . None  Social History Narrative  . None    Outpatient Encounter Medications as of 10/31/2017  Medication Sig  . aspirin 81 MG tablet Take 81 mg by mouth daily. PM  . atorvastatin (LIPITOR) 40 MG tablet Take 1 tablet (40 mg total) by mouth daily at 6 PM.  . clopidogrel (PLAVIX) 75 MG tablet Take 1 tablet (75 mg total) by mouth daily.  . Coenzyme Q10 (COQ10) 100 MG CAPS Take 2 capsules by mouth daily.  . famotidine-calcium carbonate-magnesium hydroxide (PEPCID COMPLETE) 10-800-165 MG chewable tablet Chew 1 tablet by mouth daily as needed.  . fluticasone (FLONASE) 50 MCG/ACT nasal spray Place 2 sprays into both nostrils daily.   Marland Kitchen gentamicin ointment (GARAMYCIN) 0.1 % Apply 1 application topically as needed. Apply to affected area 3 times a day  . losartan (COZAAR) 25 MG tablet Take 1 tablet (25 mg total) by mouth daily.  . Multiple Vitamin (MULTIVITAMIN) capsule Take 1 capsule by mouth daily. PM  . Probiotic Product (PROBIOTIC DAILY PO) Take 1 capsule by mouth daily. PM  . traMADol (ULTRAM) 50 MG tablet Take 1 tablet (50 mg total) by mouth every 8 (eight) hours as needed for moderate pain.   No facility-administered encounter medications on file as of 10/31/2017.     Activities of Daily Living In your present state of health, do you have any difficulty performing the following activities: 10/31/2017 03/17/2017  Hearing? [name]  Comment wears bilateral hearing aids pt wears hearing aides in both ears.  Vision? N N  Comment - pt wears glasses  Difficulty concentrating or making decisions? N N  Walking or climbing stairs? N Y  Dressing or bathing? N N  Doing errands, shopping? N N  Preparing Food and eating ? N -  Using the Toilet? N -  In the past six months, have you accidently leaked urine? N -  Do you have problems with loss of bowel control? N -  Managing your Medications? N -  Managing your Finances? N -  Housekeeping or managing your Housekeeping? N -  Some recent data might be hidden    Patient Care Team: [name], MD as PCP - General (Family Medicine) Ubaldo Glassing Javier Docker, MD as Consulting Physician (Cardiology) Marry Guan, Laurice Record, MD as Consulting Physician (Orthopedic Surgery)  Beverly Gust, MD as Consulting Physician (Otolaryngology)   Assessment:     Exercise Activities and Dietary recommendations Current Exercise Habits: Structured exercise class, Type of exercise: Other - see comments;strength training/weights;walking(rides a stationary bicycle for 30 minutes), Time (Minutes): 60(or 1.15 hours), Frequency (Times/Week): 3, Weekly Exercise (Minutes/Week): 180, Intensity: Mild, Exercise limited by: orthopedic condition(s)  Goals    . DIET - REDUCE SALT INTAKE     Recommend to continue cutting out all salt in daily diet.       Fall Risk Fall Risk  10/31/2017 10/21/2016 07/22/2016  Falls in the past year? No No No  Comment - - Emmi Telephone Survey: data to providers prior to load   Is the patient's home free of loose throw rugs in walkways, pet beds, electrical cords, etc?   no      Grab bars in the bathroom? no      Handrails on the stairs?   no      Adequate lighting?   no Depression Screen PHQ 2/9 Scores 10/31/2017 10/21/2016  PHQ - 2 Score 0 0    Cognitive Function     6CIT Screen 10/31/2017  What Year? 0 points  What month? 0 points  What time? 0 points  Count back from  20 0 points  Months in reverse 2 points  Repeat phrase 0 points  Total Score 2    Immunization History  Administered Date(s) Administered  . Influenza, High Dose Seasonal PF 08/26/2017  . Influenza-Unspecified 09/25/2016  . Pneumococcal Conjugate-13 01/19/2014  . Pneumococcal Polysaccharide-23 03/24/2015  . Td 07/04/2004, 09/25/2010  . Tdap 09/25/2010  . Zoster 09/17/2011   Screening Tests Health Maintenance  Topic Date Due  . Hepatitis C Screening  [birth year]  . TETANUS/TDAP  09/25/2020  . COLONOSCOPY  04/13/2025  . INFLUENZA VACCINE  Completed  . PNA vac Low Risk Adult  Completed   Cancer Screenings: Lung: Low Dose CT Chest recommended if Age 3-80 years, 30 pack-year currently smoking OR have quit w/in 15years. Patient does not qualify. Breast: Mammogram? No  Up to date of Bone Density/Dexa? No Colorectal: last completed 04/14/15  Additional Screenings:  Hepatitis B/HIV/Syphillis: Pt declined screening today. Hepatitis C Screening: Pt declined today and wants to receive this with his other blood work that will be ordered next week with PCP.    Plan:  I have personally reviewed and addressed the Medicare Annual Wellness questionnaire and have noted the following in the patient's chart:  A. Medical and social history B. Use of alcohol, tobacco or illicit drugs  C. Current medications and supplements D. Functional ability and status E.  Nutritional status F.  Physical activity G. Advance directives H. List of other physicians I.  Hospitalizations, surgeries, and ER visits in previous 12 months J.  [place] such as hearing and vision if needed, cognitive and depression L. Referrals and appointments - none  In addition, I have reviewed and discussed with patient certain preventive protocols, quality metrics, and best practice recommendations. A written personalized care plan for preventive services as well as general preventive health recommendations were  provided to patient.  See attached scanned questionnaire for additional information.   Signed,  Fabio Neighbors, LPN Nurse Health Advisor   Nurse Recommendations: Pt needs a Hepatitis C blood test added to his other blood work orders at next [place] on 11/03/17. Pt requested to have all blood work completed together.

## 2017-10-31 NOTE — Patient Instructions (Signed)
James Compton , Thank you for taking time to come for your Medicare Wellness Visit. I appreciate your ongoing commitment to your health goals. Please review the following plan we discussed and let me know if I can assist you in the future.   Screening recommendations/referrals: Colonoscopy: up to date Recommended yearly ophthalmology/optometry visit for glaucoma screening and checkup Recommended yearly dental visit for hygiene and checkup  Vaccinations: Influenza vaccine: up to date Pneumococcal vaccine: up to date Tdap vaccine: up to date Shingles vaccine: last completed 08/2011    Advanced directives: Already on file.  Conditions/risks identified: Recommend to continue cutting out all salt in daily diet.   Next appointment: 11/03/17 @ 11:00 AM  Preventive Care 65 Years and Older, Male Preventive care refers to lifestyle choices and visits with your health care provider that can promote health and wellness. What does preventive care include?  A yearly physical exam. This is also called an annual well check.  Dental exams once or twice a year.  Routine eye exams. Ask your health care provider how often you should have your eyes checked.  Personal lifestyle choices, including:  Daily care of your teeth and gums.  Regular physical activity.  Eating a healthy diet.  Avoiding tobacco and drug use.  Limiting alcohol use.  Practicing safe sex.  Taking low doses of aspirin every day.  Taking vitamin and mineral supplements as recommended by your health care provider. What happens during an annual well check? The services and screenings done by your health care provider during your annual well check will depend on your age, overall health, lifestyle risk factors, and family history of disease. Counseling  Your health care provider may ask you questions about your:  Alcohol use.  Tobacco use.  Drug use.  Emotional well-being.  Home and relationship well-being.  Sexual  activity.  Eating habits.  History of falls.  Memory and ability to understand (cognition).  Work and work Statistician. Screening  You may have the following tests or measurements:  Height, weight, and BMI.  Blood pressure.  Lipid and cholesterol levels. These may be checked every 5 years, or more frequently if you are over 67 years old.  Skin check.  Lung cancer screening. You may have this screening every year starting at age 27 if you have a 30-pack-year history of smoking and currently smoke or have quit within the past 15 years.  Fecal occult blood test (FOBT) of the stool. You may have this test every year starting at age 63.  Flexible sigmoidoscopy or colonoscopy. You may have a sigmoidoscopy every 5 years or a colonoscopy every 10 years starting at age 29.  Prostate cancer screening. Recommendations will vary depending on your family history and other risks.  Hepatitis C blood test.  Hepatitis B blood test.  Sexually transmitted disease (STD) testing.  Diabetes screening. This is done by checking your blood sugar (glucose) after you have not eaten for a while (fasting). You may have this done every 1-3 years.  Abdominal aortic aneurysm (AAA) screening. You may need this if you are a current or former smoker.  Osteoporosis. You may be screened starting at age 63 if you are at high risk. Talk with your health care provider about your test results, treatment options, and if necessary, the need for more tests. Vaccines  Your health care provider may recommend certain vaccines, such as:  Influenza vaccine. This is recommended every year.  Tetanus, diphtheria, and acellular pertussis (Tdap, Td) vaccine. You may  need a Td booster every 10 years.  Zoster vaccine. You may need this after age 70.  Pneumococcal 13-valent conjugate (PCV13) vaccine. One dose is recommended after age 60.  Pneumococcal polysaccharide (PPSV23) vaccine. One dose is recommended after age  56. Talk to your health care provider about which screenings and vaccines you need and how often you need them. This information is not intended to replace advice given to you by your health care provider. Make sure you discuss any questions you have with your health care provider. Document Released: 12/08/2015 Document Revised: 07/31/2016 Document Reviewed: 09/12/2015 Elsevier Interactive Patient Education  2017 [place] Prevention in the Home Falls can cause injuries. They can happen to people of all ages. There are many things you can do to make your home safe and to help prevent falls. What can I do on the outside of my home?  Regularly fix the edges of walkways and driveways and fix any cracks.  Remove anything that might make you trip as you walk through a door, such as a raised step or threshold.  Trim any bushes or trees on the path to your home.  Use bright outdoor lighting.  Clear any walking paths of anything that might make someone trip, such as rocks or tools.  Regularly check to see if handrails are loose or broken. Make sure that both sides of any steps have handrails.  Any raised decks and porches should have guardrails on the edges.  Have any leaves, snow, or ice cleared regularly.  Use sand or salt on walking paths during winter.  Clean up any spills in your garage right away. This includes oil or grease spills. What can I do in the bathroom?  Use night lights.  Install grab bars by the toilet and in the tub and shower. Do not use towel bars as grab bars.  Use non-skid mats or decals in the tub or shower.  If you need to sit down in the shower, use a plastic, non-slip stool.  Keep the floor dry. Clean up any water that spills on the floor as soon as it happens.  Remove soap buildup in the tub or shower regularly.  Attach bath mats securely with double-sided non-slip rug tape.  Do not have throw rugs and other things on the floor that can make  you trip. What can I do in the bedroom?  Use night lights.  Make sure that you have a light by your bed that is easy to reach.  Do not use any sheets or blankets that are too big for your bed. They should not hang down onto the floor.  Have a firm chair that has side arms. You can use this for support while you get dressed.  Do not have throw rugs and other things on the floor that can make you trip. What can I do in the kitchen?  Clean up any spills right away.  Avoid walking on wet floors.  Keep items that you use a lot in easy-to-reach places.  If you need to reach something above you, use a strong step stool that has a grab bar.  Keep electrical cords out of the way.  Do not use floor polish or wax that makes floors slippery. If you must use wax, use non-skid floor wax.  Do not have throw rugs and other things on the floor that can make you trip. What can I do with my stairs?  Do not leave any items on  the stairs.  Make sure that there are handrails on both sides of the stairs and use them. Fix handrails that are broken or loose. Make sure that handrails are as long as the stairways.  Check any carpeting to make sure that it is firmly attached to the stairs. Fix any carpet that is loose or worn.  Avoid having throw rugs at the top or bottom of the stairs. If you do have throw rugs, attach them to the floor with carpet tape.  Make sure that you have a light switch at the top of the stairs and the bottom of the stairs. If you do not have them, ask someone to add them for you. What else can I do to help prevent falls?  Wear shoes that:  Do not have high heels.  Have rubber bottoms.  Are comfortable and fit you well.  Are closed at the toe. Do not wear sandals.  If you use a stepladder:  Make sure that it is fully opened. Do not climb a closed stepladder.  Make sure that both sides of the stepladder are locked into place.  Ask someone to hold it for you, if  possible.  Clearly mark and make sure that you can see:  Any grab bars or handrails.  First and last steps.  Where the edge of each step is.  Use tools that help you move around (mobility aids) if they are needed. These include:  Canes.  Walkers.  Scooters.  Crutches.  Turn on the lights when you go into a dark area. Replace any light bulbs as soon as they burn out.  Set up your furniture so you have a clear path. Avoid moving your furniture around.  If any of your floors are uneven, fix them.  If there are any pets around you, be aware of where they are.  Review your medicines with your doctor. Some medicines can make you feel dizzy. This can increase your chance of falling. Ask your doctor what other things that you can do to help prevent falls. This information is not intended to replace advice given to you by your health care provider. Make sure you discuss any questions you have with your health care provider. Document Released: 09/07/2009 Document Revised: 04/18/2016 Document Reviewed: 12/16/2014 Elsevier Interactive Patient Education  2017 [employer].

## 2017-11-03 ENCOUNTER — Ambulatory Visit: Payer: Medicare Other | Admitting: Family Medicine

## 2017-11-07 ENCOUNTER — Ambulatory Visit (INDEPENDENT_AMBULATORY_CARE_PROVIDER_SITE_OTHER): Payer: Medicare Other | Admitting: Family Medicine

## 2017-11-07 ENCOUNTER — Encounter: Payer: Self-pay | Admitting: Family Medicine

## 2017-11-07 VITALS — BP 128/64 | HR 61 | Temp 98.7°F | Resp 16 | Ht 68.0 in | Wt 195.0 lb

## 2017-11-07 DIAGNOSIS — E785 Hyperlipidemia, unspecified: Secondary | ICD-10-CM

## 2017-11-07 DIAGNOSIS — G459 Transient cerebral ischemic attack, unspecified: Secondary | ICD-10-CM

## 2017-11-07 DIAGNOSIS — G4710 Hypersomnia, unspecified: Secondary | ICD-10-CM | POA: Diagnosis not present

## 2017-11-07 DIAGNOSIS — I10 Essential (primary) hypertension: Secondary | ICD-10-CM

## 2017-11-07 DIAGNOSIS — Z125 Encounter for screening for malignant neoplasm of prostate: Secondary | ICD-10-CM | POA: Diagnosis not present

## 2017-11-07 NOTE — Progress Notes (Signed)
Patient: James Compton Male    DOB: 11-Jan-1951   66 y.o.   MRN: [id number] Visit Date: 11/07/2017  Today's Provider: Lelon Huh, MD   Chief Complaint  Patient presents with  . Follow-up  . Hypertension  . Hyperlipidemia   Subjective:      Patient saw [facility] on 10/31/2017 for AVW.  HPI   Hypertension, follow-up:  BP Readings from Last 3 Encounters:  11/07/17 128/64  10/31/17 140/64  07/04/17 130/80    He was last seen for hypertension 4 months ago.  BP at that visit was 130/80. Management since that visit includes; no changes.He reports good compliance with treatment. He is not having side effects. none He is exercising. He is adherent to low salt diet.   Outside blood pressures are normal. He is experiencing none.  Patient denies none.   Cardiovascular risk factors include advanced age (older than 12 for men, 40 for women).  Use of agents associated with hypertension: none.   ----------------------------------------------------------------    Lipid/Cholesterol, Follow-up:   Last seen for this 4 months ago.  Management since that visit includes; labs checked, no changes.  Last Lipid Panel:    Component Value Date/Time   CHOL 95 (L) 07/07/2017 0823   TRIG 129 07/07/2017 0823   HDL 39 (L) 07/07/2017 0823   CHOLHDL 2.4 07/07/2017 0823   CHOLHDL 4.4 11/04/2016 0351   VLDL 38 11/04/2016 0351   LDLCALC 30 07/07/2017 0823    He reports good compliance with treatment. He is not having side effects. none  Wt Readings from Last 3 Encounters:  11/07/17 195 lb (88.5 kg)  10/31/17 197 lb 3.2 oz (89.4 kg)  07/04/17 192 lb (87.1 kg)    ----------------------------------------------------------------  States he feels sleepy in the morning, his wife tells him he snores loudly and stops breathing when he sleeps. He usually gets up once or twice to urinate, but otherwise feel that his sleep is uninterrupted.    Allergies  Allergen Reactions  .  Penicillins Nausea Only     Current Outpatient Medications:  .  aspirin 81 MG tablet, Take 81 mg by mouth daily. PM, Disp: , Rfl:  .  atorvastatin (LIPITOR) 40 MG tablet, Take 1 tablet (40 mg total) by mouth daily at 6 PM., Disp: 30 tablet, Rfl: 12 .  clopidogrel (PLAVIX) 75 MG tablet, Take 1 tablet (75 mg total) by mouth daily., Disp: 30 tablet, Rfl: 11 .  Coenzyme Q10 (COQ10) 100 MG CAPS, Take 2 capsules by mouth daily., Disp: , Rfl:  .  famotidine-calcium carbonate-magnesium hydroxide (PEPCID COMPLETE) 10-800-165 MG chewable tablet, Chew 1 tablet by mouth daily as needed., Disp: , Rfl:  .  fluticasone (FLONASE) 50 MCG/ACT nasal spray, Place 2 sprays into both nostrils daily. , Disp: , Rfl:  .  gentamicin ointment (GARAMYCIN) 0.1 %, Apply 1 application topically as needed. Apply to affected area 3 times a day, Disp: , Rfl:  .  losartan (COZAAR) 25 MG tablet, Take 1 tablet (25 mg total) by mouth daily., Disp: 90 tablet, Rfl: 4 .  Multiple Vitamin (MULTIVITAMIN) capsule, Take 1 capsule by mouth daily. PM, Disp: , Rfl:  .  Probiotic Product (PROBIOTIC DAILY PO), Take 1 capsule by mouth daily. PM, Disp: , Rfl:  .  traMADol (ULTRAM) 50 MG tablet, Take 1 tablet (50 mg total) by mouth every 8 (eight) hours as needed for moderate pain., Disp: 30 tablet, Rfl: 2  Review of Systems  Constitutional: Negative  for appetite change, chills and fever.  Respiratory: Negative for chest tightness, shortness of breath and wheezing.   Cardiovascular: Negative for chest pain and palpitations.  Gastrointestinal: Negative for abdominal pain, nausea and vomiting.    Social History   Tobacco Use  . Smoking status: Former Smoker    Last attempt to quit: 11/25/1970    Years since quitting: 46.9  . Smokeless tobacco: Never Used  Substance Use Topics  . Alcohol use: No   Objective:   BP 128/64 (BP Location: Right Arm, Patient Position: Sitting, Cuff Size: Large)   Pulse 61   Temp 98.7 F (37.1 C) (Oral)    Resp 16   Ht 5\' 8"  (1.727 m)   Wt 195 lb (88.5 kg)   SpO2 98%   BMI 29.65 kg/m  Vitals:   11/07/17 1615  BP: 128/64  Pulse: 61  Resp: 16  Temp: 98.7 F (37.1 C)  TempSrc: Oral  SpO2: 98%  Weight: 195 lb (88.5 kg)  Height: 5\' 8"  (1.727 m)     Physical Exam   General Appearance:    Alert, cooperative, no distress  Eyes:    PERRL, conjunctiva/corneas clear, EOM's intact       Lungs:     Clear to auscultation bilaterally, respirations unlabored  Heart:    Regular rate and rhythm  Neurologic:   Awake, alert, oriented x 3. No apparent focal neurological           defect.           Assessment & Plan:     1. TIA (transient ischemic attack) Asymptomatic since being on ASA and clopidogrel   2. Essential hypertension Well controlled.  Continue current medications.    3. Hyperlipidemia, unspecified hyperlipidemia type He is tolerating atorvastatin well with no adverse effects.    4. Hypersomnia  - Home sleep test  6. Prostate cancer screening  - PSA       Lelon Huh, MD  [facility]

## 2017-11-07 NOTE — Patient Instructions (Signed)
   The CDC recommends two doses of Shingrix (the shingles vaccine) separated by 2 to 6 months for adults age 68 years and older. I recommend checking with your insurance plan regarding coverage for this vaccine.   

## 2017-11-08 LAB — PSA: PSA: 1.1 ng/mL (ref <=4.0)

## 2017-11-11 ENCOUNTER — Telehealth: Payer: Self-pay

## 2017-11-11 NOTE — Telephone Encounter (Signed)
Patient advised as below.  

## 2017-11-11 NOTE — Telephone Encounter (Signed)
-----   Message from [name], MD sent at 11/09/2017  9:17 PM EST ----- PSA is normal. Check yearly.

## 2017-11-24 ENCOUNTER — Telehealth: Payer: Self-pay | Admitting: Family Medicine

## 2017-11-24 NOTE — Telephone Encounter (Signed)
Order for home sleep study faxed to [facility] °

## 2017-11-26 ENCOUNTER — Telehealth: Payer: Self-pay | Admitting: Family Medicine

## 2017-11-26 MED ORDER — SCOPOLAMINE 1 MG/3DAYS TD PT72: 1.0000 | MEDICATED_PATCH | TRANSDERMAL | 0 refills | Status: AC

## 2017-11-26 NOTE — Telephone Encounter (Signed)
Have sent prescription to [facility].  

## 2017-11-26 NOTE — Telephone Encounter (Signed)
Please advise 

## 2017-11-26 NOTE — Telephone Encounter (Signed)
Patient is going on a cruise Jan 21 and wants to get scopolamine patches please.  At least 3 in the box.  Call to [facility] on [address]

## 2017-11-27 NOTE — Telephone Encounter (Signed)
Patient advised.

## 2017-12-02 DIAGNOSIS — R0602 Shortness of breath: Secondary | ICD-10-CM | POA: Diagnosis not present

## 2017-12-02 DIAGNOSIS — G4733 Obstructive sleep apnea (adult) (pediatric): Secondary | ICD-10-CM | POA: Diagnosis not present

## 2017-12-03 DIAGNOSIS — R0602 Shortness of breath: Secondary | ICD-10-CM | POA: Diagnosis not present

## 2017-12-03 DIAGNOSIS — G4733 Obstructive sleep apnea (adult) (pediatric): Secondary | ICD-10-CM | POA: Diagnosis not present

## 2017-12-09 ENCOUNTER — Encounter: Payer: Self-pay | Admitting: Family Medicine

## 2017-12-09 ENCOUNTER — Telehealth: Payer: Self-pay | Admitting: Family Medicine

## 2017-12-09 DIAGNOSIS — G4733 Obstructive sleep apnea (adult) (pediatric): Secondary | ICD-10-CM | POA: Insufficient documentation

## 2017-12-09 NOTE — Telephone Encounter (Signed)
Patient advised and verbally voiced understanding.  

## 2017-12-09 NOTE — Telephone Encounter (Signed)
Please advise sleep study is consistent with obstructive sleep apnea. Have sent order for trial CPAP to [facility]. Once he gets he CPAP he will need to call and schedule follow up one month after starting to use it.

## 2017-12-25 DIAGNOSIS — M170 Bilateral primary osteoarthritis of knee: Secondary | ICD-10-CM | POA: Diagnosis not present

## 2017-12-25 DIAGNOSIS — M25561 Pain in right knee: Secondary | ICD-10-CM | POA: Diagnosis not present

## 2017-12-25 DIAGNOSIS — M25562 Pain in left knee: Secondary | ICD-10-CM | POA: Diagnosis not present

## 2017-12-31 DIAGNOSIS — M25561 Pain in right knee: Secondary | ICD-10-CM | POA: Diagnosis not present

## 2017-12-31 DIAGNOSIS — M1711 Unilateral primary osteoarthritis, right knee: Secondary | ICD-10-CM | POA: Diagnosis not present

## 2018-01-01 DIAGNOSIS — M25562 Pain in left knee: Secondary | ICD-10-CM | POA: Diagnosis not present

## 2018-01-01 DIAGNOSIS — M1712 Unilateral primary osteoarthritis, left knee: Secondary | ICD-10-CM | POA: Diagnosis not present

## 2018-01-06 DIAGNOSIS — M25561 Pain in right knee: Secondary | ICD-10-CM | POA: Diagnosis not present

## 2018-01-06 DIAGNOSIS — M25562 Pain in left knee: Secondary | ICD-10-CM | POA: Diagnosis not present

## 2018-01-06 DIAGNOSIS — M170 Bilateral primary osteoarthritis of knee: Secondary | ICD-10-CM | POA: Diagnosis not present

## 2018-01-14 DIAGNOSIS — H903 Sensorineural hearing loss, bilateral: Secondary | ICD-10-CM | POA: Diagnosis not present

## 2018-01-29 ENCOUNTER — Ambulatory Visit (INDEPENDENT_AMBULATORY_CARE_PROVIDER_SITE_OTHER): Payer: Medicare Other | Admitting: Family Medicine

## 2018-01-29 ENCOUNTER — Encounter: Payer: Self-pay | Admitting: Family Medicine

## 2018-01-29 DIAGNOSIS — G4733 Obstructive sleep apnea (adult) (pediatric): Secondary | ICD-10-CM | POA: Diagnosis not present

## 2018-01-29 NOTE — Progress Notes (Signed)
       Patient: James Compton Male    DOB: 1951-10-25   68 y.o.   MRN: [id number] Visit Date: 01/29/2018  Today's Provider: Lelon Huh, MD   Chief Complaint  Patient presents with  . Sleep Apnea   Subjective:    HPI  Hypersomnia From 11/07/2017-Home sleep test done. Results were consistent with OSA. Patient started on CPAP and advised to follow-up 1 month after starting. Today patient comes in reporting good compliance with CPAP usage. Patient states since using the CPAP, he has been more alert and energized. He says he doesn't feel sleepy all the time. Is also sleeping more soundly and not having to get up at night to void. Is tolerating well with no problems.    Allergies  Allergen Reactions  . Penicillins Nausea Only     Current Outpatient Medications:  .  aspirin 81 MG tablet, Take 81 mg by mouth daily. PM, Disp: , Rfl:  .  atorvastatin (LIPITOR) 40 MG tablet, Take 1 tablet (40 mg total) by mouth daily at 6 PM., Disp: 30 tablet, Rfl: 12 .  clopidogrel (PLAVIX) 75 MG tablet, Take 1 tablet (75 mg total) by mouth daily., Disp: 30 tablet, Rfl: 11 .  Coenzyme Q10 (COQ10) 100 MG CAPS, Take 2 capsules by mouth daily., Disp: , Rfl:  .  famotidine-calcium carbonate-magnesium hydroxide (PEPCID COMPLETE) 10-800-165 MG chewable tablet, Chew 1 tablet by mouth daily as needed., Disp: , Rfl:  .  fluticasone (FLONASE) 50 MCG/ACT nasal spray, Place 2 sprays into both nostrils daily. , Disp: , Rfl:  .  gentamicin ointment (GARAMYCIN) 0.1 %, Apply 1 application topically as needed. Apply to affected area 3 times a day, Disp: , Rfl:  .  losartan (COZAAR) 25 MG tablet, Take 1 tablet (25 mg total) by mouth daily., Disp: 90 tablet, Rfl: 4 .  Multiple Vitamin (MULTIVITAMIN) capsule, Take 1 capsule by mouth daily. PM, Disp: , Rfl:  .  Probiotic Product (PROBIOTIC DAILY PO), Take 1 capsule by mouth daily. PM, Disp: , Rfl:  .  traMADol (ULTRAM) 50 MG tablet, Take 1 tablet (50 mg total) by mouth every 8  (eight) hours as needed for moderate pain., Disp: 30 tablet, Rfl: 2  Review of Systems  Constitutional: Negative for appetite change, chills and fever.  Respiratory: Negative for chest tightness, shortness of breath and wheezing.   Cardiovascular: Negative for chest pain and palpitations.  Gastrointestinal: Negative for abdominal pain, nausea and vomiting.    Social History   Tobacco Use  . Smoking status: Former Smoker    Last attempt to quit: 11/25/1970    Years since quitting: 47.2  . Smokeless tobacco: Never Used  Substance Use Topics  . Alcohol use: No   Objective:   BP 130/68 (BP Location: Left Arm, Patient Position: Sitting, Cuff Size: Large)   Pulse 65   Temp 97.9 F (36.6 C) (Oral)   Resp 16   Wt 203 lb (92.1 kg)   SpO2 99% Comment: room air  BMI 30.87 kg/m  There were no vitals filed for this visit.   Physical Exam  General appearance: alert, well developed, well nourished, cooperative and in no distress      Assessment & Plan:     1. OSA (obstructive sleep apnea) Doing well since starting CPAP. Using consistently and medically benefiting from device with improved quality of life.        Lelon Huh, MD  [facility]

## 2018-01-29 NOTE — Patient Instructions (Signed)
   The CDC recommends two doses of Shingrix (the shingles vaccine) separated by 2 to 6 months for adults age 69 years and older. I recommend checking with your insurance plan regarding coverage for this vaccine.   

## 2018-02-17 ENCOUNTER — Other Ambulatory Visit: Payer: Self-pay | Admitting: Family Medicine

## 2018-02-17 MED ORDER — CLOPIDOGREL BISULFATE 75 MG PO TABS
75.0000 mg | ORAL_TABLET | Freq: Every day | ORAL | 4 refills | Status: DC
Start: 2018-02-17 — End: 2018-09-30

## 2018-02-17 MED ORDER — ATORVASTATIN CALCIUM 40 MG PO TABS: 40.0000 mg | ORAL_TABLET | Freq: Every day | ORAL | 4 refills | Status: DC

## 2018-04-07 DIAGNOSIS — M25562 Pain in left knee: Secondary | ICD-10-CM | POA: Diagnosis not present

## 2018-04-07 DIAGNOSIS — M170 Bilateral primary osteoarthritis of knee: Secondary | ICD-10-CM | POA: Diagnosis not present

## 2018-04-07 DIAGNOSIS — M25561 Pain in right knee: Secondary | ICD-10-CM | POA: Diagnosis not present

## 2018-04-07 DIAGNOSIS — M25461 Effusion, right knee: Secondary | ICD-10-CM | POA: Diagnosis not present

## 2018-04-14 DIAGNOSIS — M25561 Pain in right knee: Secondary | ICD-10-CM | POA: Diagnosis not present

## 2018-04-14 DIAGNOSIS — M1711 Unilateral primary osteoarthritis, right knee: Secondary | ICD-10-CM | POA: Diagnosis not present

## 2018-04-21 DIAGNOSIS — M25561 Pain in right knee: Secondary | ICD-10-CM | POA: Diagnosis not present

## 2018-04-21 DIAGNOSIS — M1711 Unilateral primary osteoarthritis, right knee: Secondary | ICD-10-CM | POA: Diagnosis not present

## 2018-05-24 ENCOUNTER — Other Ambulatory Visit: Payer: Self-pay | Admitting: Family Medicine

## 2018-05-27 ENCOUNTER — Other Ambulatory Visit: Payer: Self-pay | Admitting: Family Medicine

## 2018-05-27 MED ORDER — TRAMADOL HCL 50 MG PO TABS: 50.0000 mg | ORAL_TABLET | Freq: Three times a day (TID) | ORAL | 2 refills | Status: DC | PRN

## 2018-05-27 NOTE — Telephone Encounter (Signed)
Patient would like to get a refill on Tramadol sent to  [facility] [facility] for his knee pain

## 2018-06-25 DIAGNOSIS — H43813 Vitreous degeneration, bilateral: Secondary | ICD-10-CM | POA: Diagnosis not present

## 2018-06-25 DIAGNOSIS — H2513 Age-related nuclear cataract, bilateral: Secondary | ICD-10-CM | POA: Diagnosis not present

## 2018-06-25 DIAGNOSIS — H524 Presbyopia: Secondary | ICD-10-CM | POA: Diagnosis not present

## 2018-06-25 DIAGNOSIS — H11153 Pinguecula, bilateral: Secondary | ICD-10-CM | POA: Diagnosis not present

## 2018-06-25 DIAGNOSIS — H25013 Cortical age-related cataract, bilateral: Secondary | ICD-10-CM | POA: Diagnosis not present

## 2018-06-26 DIAGNOSIS — M1731 Unilateral post-traumatic osteoarthritis, right knee: Secondary | ICD-10-CM | POA: Diagnosis not present

## 2018-06-26 DIAGNOSIS — M25561 Pain in right knee: Secondary | ICD-10-CM | POA: Diagnosis not present

## 2018-07-02 ENCOUNTER — Encounter: Payer: Self-pay | Admitting: Family Medicine

## 2018-07-02 ENCOUNTER — Ambulatory Visit (INDEPENDENT_AMBULATORY_CARE_PROVIDER_SITE_OTHER): Payer: Medicare Other | Admitting: Family Medicine

## 2018-07-02 VITALS — BP 128/70 | HR 63 | Temp 98.5°F | Resp 16 | Wt 202.0 lb

## 2018-07-02 DIAGNOSIS — M1711 Unilateral primary osteoarthritis, right knee: Secondary | ICD-10-CM

## 2018-07-02 DIAGNOSIS — F458 Other somatoform disorders: Secondary | ICD-10-CM

## 2018-07-02 DIAGNOSIS — G4733 Obstructive sleep apnea (adult) (pediatric): Secondary | ICD-10-CM

## 2018-07-02 DIAGNOSIS — I10 Essential (primary) hypertension: Secondary | ICD-10-CM | POA: Diagnosis not present

## 2018-07-02 DIAGNOSIS — E785 Hyperlipidemia, unspecified: Secondary | ICD-10-CM | POA: Diagnosis not present

## 2018-07-02 DIAGNOSIS — Z1159 Encounter for screening for other viral diseases: Secondary | ICD-10-CM

## 2018-07-02 DIAGNOSIS — M722 Plantar fascial fibromatosis: Secondary | ICD-10-CM

## 2018-07-02 DIAGNOSIS — R0989 Other specified symptoms and signs involving the circulatory and respiratory systems: Secondary | ICD-10-CM

## 2018-07-02 NOTE — Progress Notes (Signed)
Patient: James Compton Male    DOB: [birth year]   67 y.o.   MRN: [id number] Visit Date: 07/02/2018  Today's Provider: Lelon Huh, MD   Chief Complaint  Patient presents with  . Hypertension  . Hyperlipidemia   Subjective:    HPI   Hypertension, follow-up:  BP Readings from Last 3 Encounters:  07/02/18 128/70  01/29/18 130/68  11/07/17 128/64    He was last seen for hypertension 8 months ago.  BP at that visit was 128/64. Management since that visit includes; no changes.He reports good compliance with treatment. He is not having side effects.  He is exercising. He is not adherent to low sal diet.   Outside blood pressures are checked occasionally. He is experiencing none.  Patient denies chest pain, chest pressure/discomfort, claudication, dyspnea, exertional chest pressure/discomfort, fatigue, irregular heart beat, lower extremity edema, near-syncope, orthopnea, palpitations, paroxysmal nocturnal dyspnea, syncope and tachypnea.   Cardiovascular risk factors include advanced age (older than 67 for men, 24 for women), dyslipidemia, hypertension and male gender.  Use of agents associated with hypertension: NSAIDS.   ------------------------------------------------------------------------    Lipid/Cholesterol, Follow-up:   Last seen for this 8 months ago.  Management since that visit includes; no changes.  Last Lipid Panel:    Component Value Date/Time   CHOL 95 (L) 07/07/2017 0823   TRIG 129 07/07/2017 0823   HDL 39 (L) 07/07/2017 0823   CHOLHDL 2.4 07/07/2017 0823   CHOLHDL 4.4 11/04/2016 0351   VLDL 38 11/04/2016 0351   LDLCALC 30 07/07/2017 0823    He reports good compliance with treatment. He is not having side effects.   Wt Readings from Last 3 Encounters:  07/02/18 202 lb (91.6 kg)  01/29/18 203 lb (92.1 kg)  11/07/17 195 lb (88.5 kg)    ------------------------------------------------------------------------  Follow up OSA He reports he  continues to use his CPAP every night consistently and reports that it is effective, and feels well rested when he wakes in the morning.   He also reports of sensation of something in his throat. It feels internal, is sore, but does not cause trouble swallowing or speaking. He does have chronic sinus drainage and uses OTC afrin and Flonase, but states that these have not helped. Sensation has been there over two weeks and seems to be getting more noticeable with time. He does have infrequent episodes of heartburn, but no episodes he last few weeks.   Allergies  Allergen Reactions  . Penicillins Nausea Only     Current Outpatient Medications:  .  aspirin 81 MG tablet, Take 81 mg by mouth daily. PM, Disp: , Rfl:  .  atorvastatin (LIPITOR) 40 MG tablet, Take 1 tablet (40 mg total) by mouth daily at 6 PM., Disp: 90 tablet, Rfl: 4 .  clopidogrel (PLAVIX) 75 MG tablet, Take 1 tablet (75 mg total) by mouth daily., Disp: 90 tablet, Rfl: 4 .  Coenzyme Q10 (COQ10) 100 MG CAPS, Take 2 capsules by mouth daily., Disp: , Rfl:  .  famotidine-calcium carbonate-magnesium hydroxide (PEPCID COMPLETE) 10-800-165 MG chewable tablet, Chew 1 tablet by mouth daily as needed., Disp: , Rfl:  .  fluticasone (FLONASE) 50 MCG/ACT nasal spray, Place 2 sprays into both nostrils daily. , Disp: , Rfl:  .  gentamicin ointment (GARAMYCIN) 0.1 %, Apply 1 application topically as needed. Apply to affected area 3 times a day, Disp: , Rfl:  .  losartan (COZAAR) 25 MG tablet, TAKE 1 TABLET BY MOUTH  EVERY DAY, Disp: 90 tablet, Rfl: 4 .  Multiple Vitamin (MULTIVITAMIN) capsule, Take 1 capsule by mouth daily. PM, Disp: , Rfl:  .  Probiotic Product (PROBIOTIC DAILY PO), Take 1 capsule by mouth daily. PM, Disp: , Rfl:  .  traMADol (ULTRAM) 50 MG tablet, Take 1 tablet (50 mg total) by mouth every 8 (eight) hours as needed for moderate pain., Disp: 30 tablet, Rfl: 2  Review of Systems  Constitutional: Negative for appetite change, chills  and fever.  HENT: Positive for trouble swallowing (feels like a pea in his throat).   Respiratory: Negative for chest tightness, shortness of breath and wheezing.   Cardiovascular: Negative for chest pain and palpitations.  Gastrointestinal: Negative for abdominal pain, nausea and vomiting.    Social History   Tobacco Use  . Smoking status: Former Smoker    Last attempt to quit: 11/25/1970    Years since quitting: 47.6  . Smokeless tobacco: Never Used  Substance Use Topics  . Alcohol use: No   Objective:   BP 128/70 (BP Location: Left Arm, Patient Position: Sitting, Cuff Size: Large)   Pulse 63   Temp 98.5 F (36.9 C) (Oral)   Resp 16   Wt 202 lb (91.6 kg)   SpO2 98% Comment: room air  BMI 30.71 kg/m  Vitals:   07/02/18 0805  BP: 128/70  Pulse: 63  Resp: 16  Temp: 98.5 F (36.9 C)  TempSrc: Oral  SpO2: 98%  Weight: 202 lb (91.6 kg)     Physical Exam  General Appearance:    Alert, cooperative, no distress  HENT:   ENT exam normal, no neck nodes or sinus tenderness. No neck tenderness of nodules palpated.   Eyes:    PERRL, conjunctiva/corneas clear, EOM's intact       Lungs:     Clear to auscultation bilaterally, respirations unlabored  Heart:    Regular rate and rhythm           Assessment & Plan:     1. Essential hypertension Well controlled.  Continue current medications.    2. OSA (obstructive sleep apnea) Complaint and medically benefiting from CPAP. Continue current plan of care.   3. Hyperlipidemia, unspecified hyperlipidemia type He is tolerating atorvastatin well with no adverse effects.   - Lipid panel - CBC - Comprehensive metabolic panel  4. Need for hepatitis C screening test  - Hepatitis C antibody  5. Globus sensation He is established patient of Dr. Pryor Ochoa, will get in for early follow to evaluate throat.  - Ambulatory referral to ENT  6. Plantar fasciitis, right Per orthopedics.   7. Osteoarthritis of right knee, unspecified  osteoarthritis type Being followed by Dr. Nelly Rout, MD  [place]

## 2018-07-03 LAB — COMPREHENSIVE METABOLIC PANEL
ALK PHOS: 134 IU/L — AB (ref 39–117)
ALT: 17 IU/L (ref 0–44)
AST: 23 IU/L (ref 0–40)
Albumin/Globulin Ratio: 1.8 (ref 1.2–2.2)
Albumin: 4.4 g/dL (ref 3.6–4.8)
BILIRUBIN TOTAL: 1.6 mg/dL — AB (ref 0.0–1.2)
BUN / CREAT RATIO: 15 (ref 10–24)
BUN: 13 mg/dL (ref 8–27)
CO2: 21 mmol/L (ref 20–29)
Calcium: 9.1 mg/dL (ref 8.6–10.2)
Chloride: 106 mmol/L (ref 96–106)
Creatinine, Ser: 0.84 mg/dL (ref 0.76–1.27)
GFR calc Af Amer: 103 mL/min/{1.73_m2} (ref >59)
GFR calc non Af Amer: 89 mL/min/{1.73_m2} (ref >59)
GLUCOSE: 95 mg/dL (ref 65–99)
Globulin, Total: 2.4 g/dL (ref 1.5–4.5)
Potassium: 4.2 mmol/L (ref 3.5–5.2)
Sodium: 142 mmol/L (ref 134–144)
Total Protein: 6.8 g/dL (ref 6.0–8.5)

## 2018-07-03 LAB — CBC
Hematocrit: 43.9 % (ref 37.5–51.0)
Hemoglobin: 15 g/dL (ref 13.0–17.7)
MCH: 31.4 pg (ref 26.6–33.0)
MCHC: 34.2 g/dL (ref 31.5–35.7)
MCV: 92 fL (ref 79–97)
PLATELETS: 184 10*3/uL (ref 150–450)
RBC: 4.77 x10E6/uL (ref 4.14–5.80)
RDW: 13.5 % (ref 12.3–15.4)
WBC: 6.6 10*3/uL (ref 3.4–10.8)

## 2018-07-03 LAB — LIPID PANEL
CHOLESTEROL TOTAL: 105 mg/dL (ref 100–199)
Chol/HDL Ratio: 2.5 ratio (ref 0.0–5.0)
HDL: 42 mg/dL (ref >39)
LDL Calculated: 45 mg/dL (ref 0–99)
TRIGLYCERIDES: 92 mg/dL (ref 0–149)
VLDL Cholesterol Cal: 18 mg/dL (ref 5–40)

## 2018-07-03 LAB — HEPATITIS C ANTIBODY: Hep C Virus Ab: <0.1 s/co ratio (ref 0.0–0.9)

## 2018-07-11 IMAGING — MR MR KNEE*R* W/O CM
5 series · 37 of 40 positions shown · non-contrast
Comparison: 10/23/2006

CLINICAL DATA: Chronic right knee pain. Prior arthroscopic surgery.

EXAM:
MRI OF THE RIGHT KNEE WITHOUT CONTRAST
TECHNIQUE: Multiplanar, multisequence MR imaging of the knee was performed. No
intravenous contrast was administered.

[Series 3: PD fat-sat · axial · 3.0mm · 0.33mm/px · z∈[-60,+52]mm · 8 of 35 slices shown (1 of 3)]
[im 1/35]
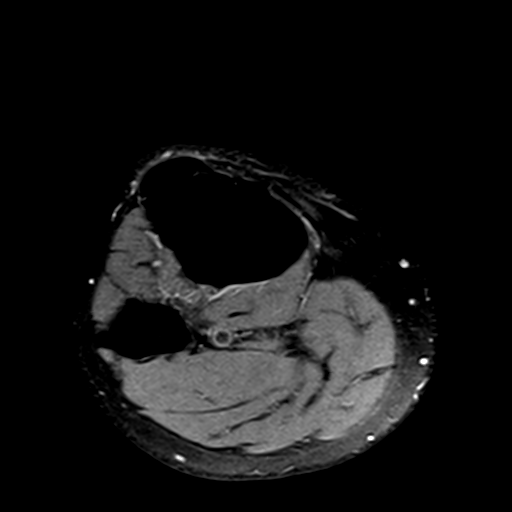
[im 5/35]
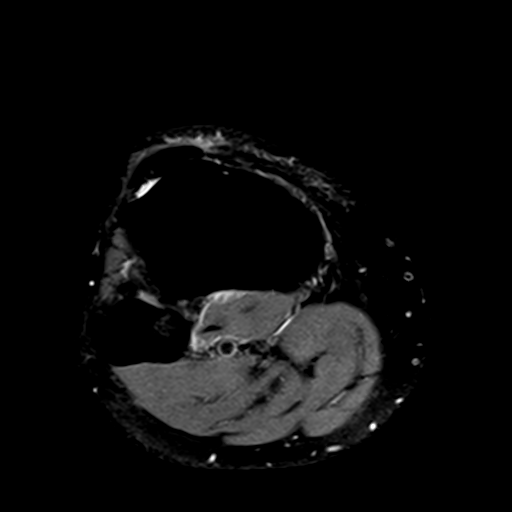
[im 10/35]
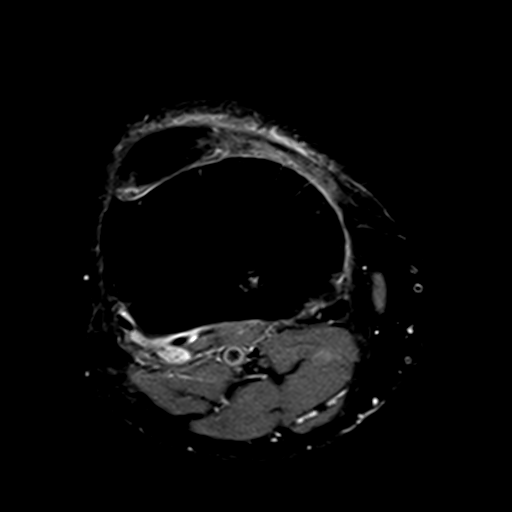
[im 15/35]
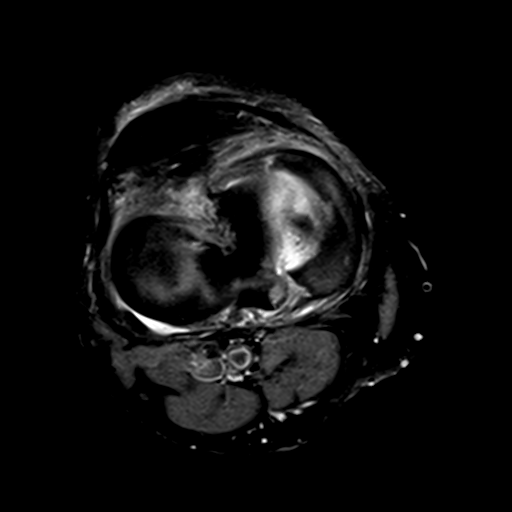
[im 20/35]
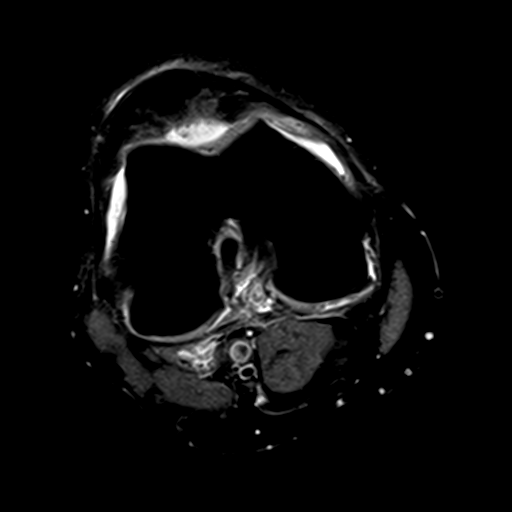
[im 25/35]
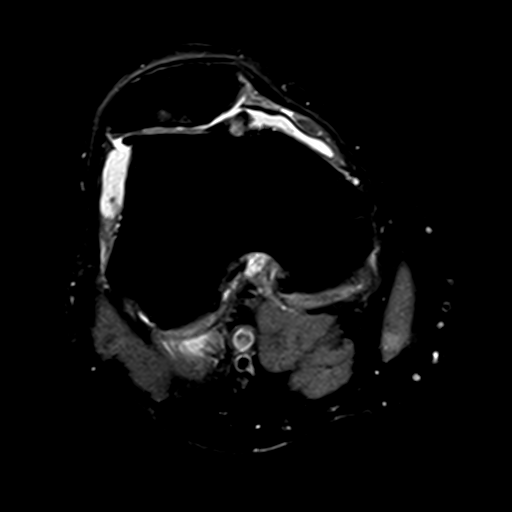
[im 30/35]
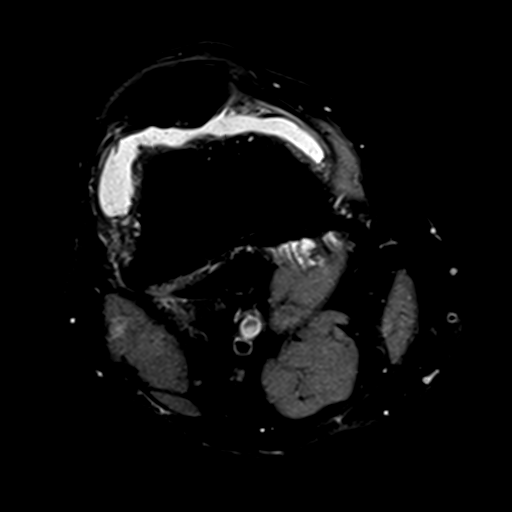
[im 35/35]
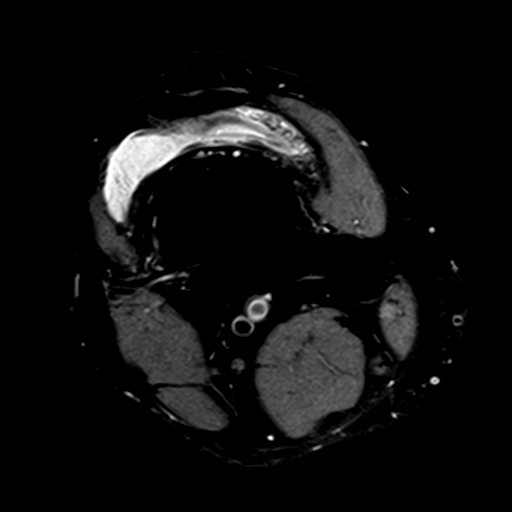

[Series 4: T1 · coronal · 3.0mm · 0.50mm/px · 5 of 33 slices shown]
[im 1/33]
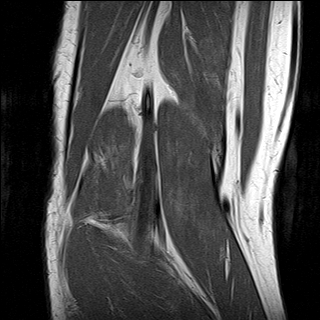
[im 5/33]
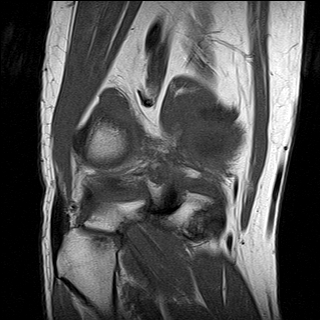
[im 10/33]
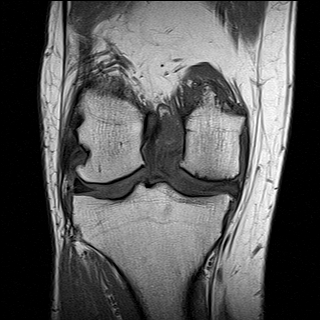
[im 14/33]
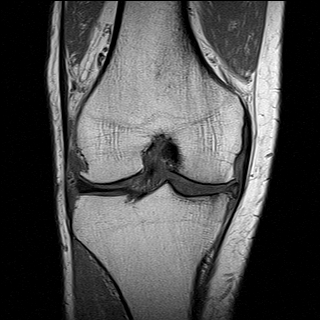
[im 19/33]
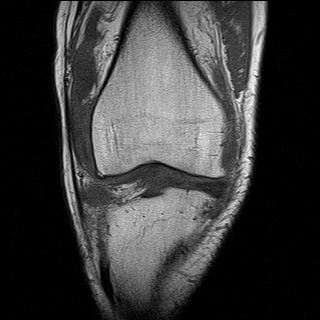

[Series 5: T2 fat-sat · coronal · 3.0mm · 0.50mm/px · 8 of 33 slices shown]
[im 1/33]
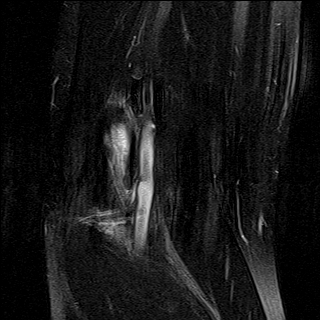
[im 5/33]
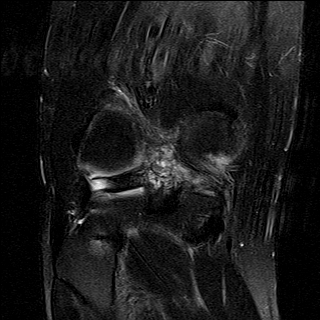
[im 10/33]
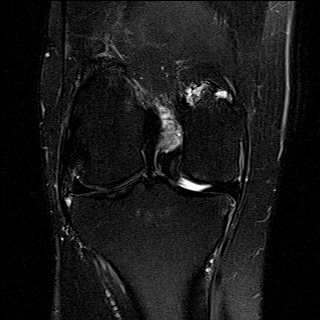
[im 14/33]
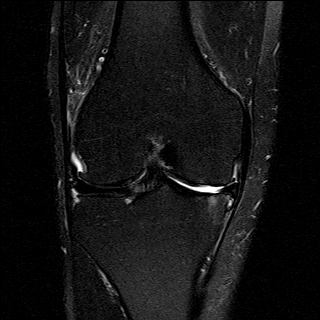
[im 19/33]
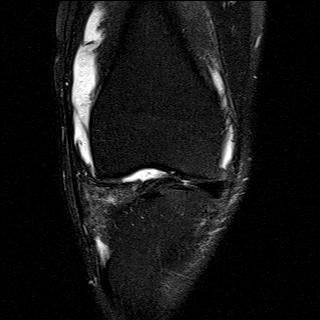
[im 23/33]
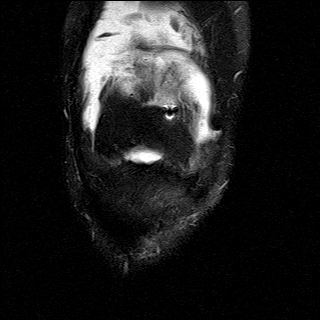
[im 28/33]
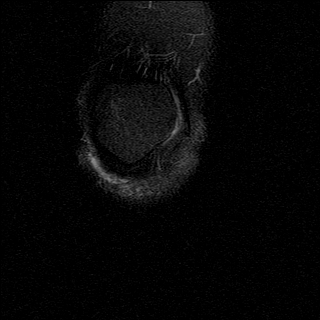
[im 33/33]
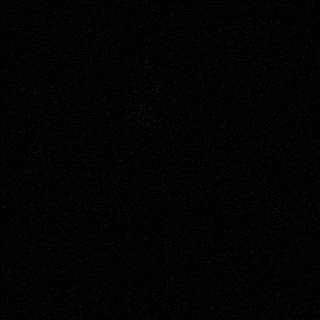

[Series 6: PD fat-sat · coronal · 3.0mm · 0.62mm/px · 8 of 33 slices shown (2 of 3)]
[im 1/33]
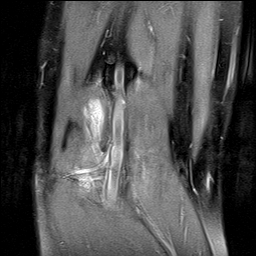
[im 5/33]
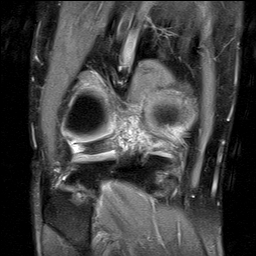
[im 10/33]
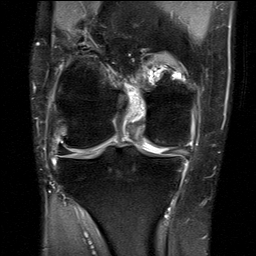
[im 14/33]
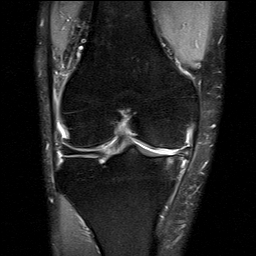
[im 19/33]
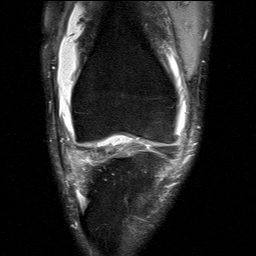
[im 23/33]
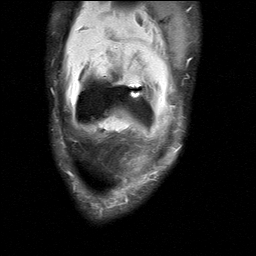
[im 28/33]
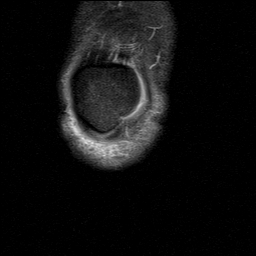
[im 33/33]
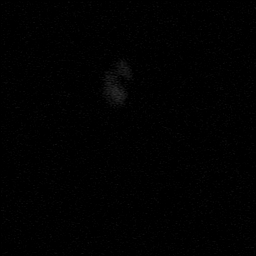

[Series 7: PD fat-sat · sagittal · 3.0mm · 0.62mm/px · 8 of 35 slices shown (3 of 3)]
[im 1/35]
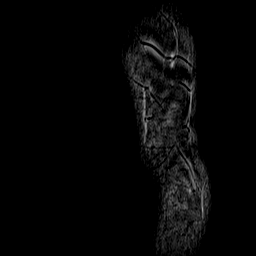
[im 5/35]
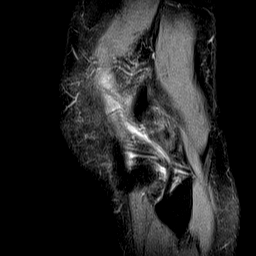
[im 10/35]
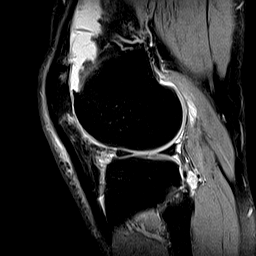
[im 15/35]
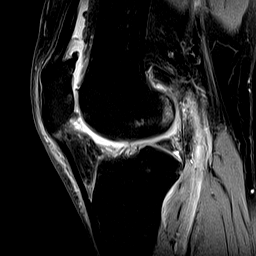
[im 20/35]
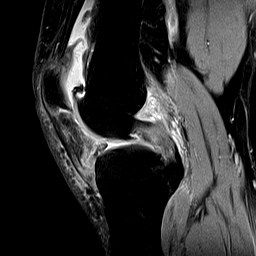
[im 25/35]
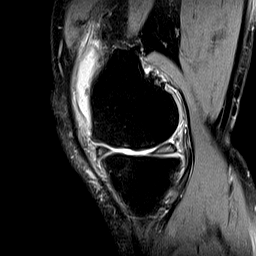
[im 30/35]
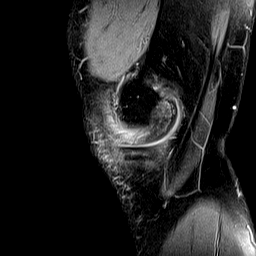
[im 35/35]
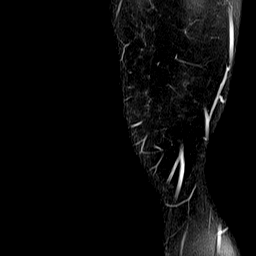

[37 of 40 positions shown; findings below may reference images not displayed]

FINDINGS: MENISCI

Medial meniscus: Large radial tear of the root of the posterior
horn. Adjacent grade 2 signal extending through the meniscus. Free
edge irregularity in the anterior horn compatible with a separate
radial tear ; image [DATE] shows both the anterior and posterior
radial tears.

Lateral meniscus:  Unremarkable

LIGAMENTS

Cruciates: Central expansion, irregularity, and increased signal in
the PCL suspicious for partial tear.

Collaterals:  Unremarkable

CARTILAGE

Patellofemoral: Severe and irregular degenerative chondral thinning
with marginal spurring and subcortical foci of edema especially
along the lateral patellar facet.

Medial: Severe chondral thinning and surface irregularity with
multiple degenerative subcortical foci of edema and marginal
spurring.

Lateral: Mild to moderate degenerative chondral thinning especially
along the lateral femoral condyles, with marginal spurring.

Joint: Moderate to large knee effusion with synovitis. Bandlike
thickened medial plica, image [DATE]. Synovitis in the popliteus
recess.

Popliteal Fossa: Mildly complex cyst-like lesion within and along
the proximal medial head gastrocnemius muscle, 2.6 by 1.6 by 0.9 cm
on image [DATE].

Extensor Mechanism:  Low-level prepatellar edema.

Bones: Degenerative subcortical edema along the proximal
tibiofibular articulation, images 4-6 series 5.

Other: No supplemental non-categorized findings.
IMPRESSION: 1. Large radial tear of the root of the posterior horn medial
meniscus. There is also a separate radial tear in the anterior horn
medial meniscus.
2. Severe osteoarthritis.
3. Suspected partial tearing of the central portion of the posterior
cruciate ligament.
4. Moderate to large knee effusion with synovitis and a bandlike
thickened medial plica.
5. Complex cyst-like lesion within along the proximal portion of the
medial head gastrocnemius muscle, likely a ganglion cyst.

## 2018-07-21 DIAGNOSIS — K219 Gastro-esophageal reflux disease without esophagitis: Secondary | ICD-10-CM | POA: Diagnosis not present

## 2018-07-21 DIAGNOSIS — R070 Pain in throat: Secondary | ICD-10-CM | POA: Diagnosis not present

## 2018-07-21 DIAGNOSIS — J340 Abscess, furuncle and carbuncle of nose: Secondary | ICD-10-CM | POA: Diagnosis not present

## 2018-07-28 DIAGNOSIS — M170 Bilateral primary osteoarthritis of knee: Secondary | ICD-10-CM | POA: Diagnosis not present

## 2018-07-28 DIAGNOSIS — M1711 Unilateral primary osteoarthritis, right knee: Secondary | ICD-10-CM | POA: Diagnosis not present

## 2018-07-28 DIAGNOSIS — M25562 Pain in left knee: Secondary | ICD-10-CM | POA: Diagnosis not present

## 2018-07-28 DIAGNOSIS — M25561 Pain in right knee: Secondary | ICD-10-CM | POA: Diagnosis not present

## 2018-08-03 DIAGNOSIS — M25561 Pain in right knee: Secondary | ICD-10-CM | POA: Diagnosis not present

## 2018-08-03 DIAGNOSIS — M1711 Unilateral primary osteoarthritis, right knee: Secondary | ICD-10-CM | POA: Diagnosis not present

## 2018-08-10 DIAGNOSIS — M1711 Unilateral primary osteoarthritis, right knee: Secondary | ICD-10-CM | POA: Diagnosis not present

## 2018-08-10 DIAGNOSIS — M25561 Pain in right knee: Secondary | ICD-10-CM | POA: Diagnosis not present

## 2018-08-11 DIAGNOSIS — M1712 Unilateral primary osteoarthritis, left knee: Secondary | ICD-10-CM | POA: Diagnosis not present

## 2018-08-11 DIAGNOSIS — M25562 Pain in left knee: Secondary | ICD-10-CM | POA: Diagnosis not present

## 2018-08-18 DIAGNOSIS — M1712 Unilateral primary osteoarthritis, left knee: Secondary | ICD-10-CM | POA: Diagnosis not present

## 2018-08-18 DIAGNOSIS — M25562 Pain in left knee: Secondary | ICD-10-CM | POA: Diagnosis not present

## 2018-08-25 DIAGNOSIS — M25562 Pain in left knee: Secondary | ICD-10-CM | POA: Diagnosis not present

## 2018-08-25 DIAGNOSIS — M1712 Unilateral primary osteoarthritis, left knee: Secondary | ICD-10-CM | POA: Diagnosis not present

## 2018-09-03 DIAGNOSIS — F458 Other somatoform disorders: Secondary | ICD-10-CM | POA: Diagnosis not present

## 2018-09-03 DIAGNOSIS — J340 Abscess, furuncle and carbuncle of nose: Secondary | ICD-10-CM | POA: Diagnosis not present

## 2018-09-24 DIAGNOSIS — Z23 Encounter for immunization: Secondary | ICD-10-CM | POA: Diagnosis not present

## 2018-09-30 ENCOUNTER — Encounter: Payer: Self-pay | Admitting: Family Medicine

## 2018-09-30 ENCOUNTER — Ambulatory Visit (INDEPENDENT_AMBULATORY_CARE_PROVIDER_SITE_OTHER): Payer: Medicare Other | Admitting: Family Medicine

## 2018-09-30 VITALS — BP 128/84 | HR 68 | Temp 98.3°F | Resp 16 | Wt 203.0 lb

## 2018-09-30 DIAGNOSIS — I10 Essential (primary) hypertension: Secondary | ICD-10-CM

## 2018-09-30 DIAGNOSIS — G459 Transient cerebral ischemic attack, unspecified: Secondary | ICD-10-CM | POA: Diagnosis not present

## 2018-09-30 DIAGNOSIS — R040 Epistaxis: Secondary | ICD-10-CM | POA: Diagnosis not present

## 2018-09-30 MED ORDER — ASPIRIN-DIPYRIDAMOLE ER 25-200 MG PO CP12: 1.0000 | ORAL_CAPSULE | Freq: Two times a day (BID) | ORAL | 1 refills | Status: DC

## 2018-09-30 MED ORDER — ATORVASTATIN CALCIUM 40 MG PO TABS: 80.0000 mg | ORAL_TABLET | Freq: Every day | ORAL | 4 refills | Status: DC

## 2018-09-30 NOTE — Patient Instructions (Addendum)
   Increase atorvastatin to 80mg  once a day. Can take 2 40mg  tablets until current bottle is gone. I'll send in prescription for 80mg  tablets before they run out   Take Aggrenox in the place of aspirin and clopidegrel

## 2018-09-30 NOTE — Progress Notes (Signed)
ee      Patient: James Compton Male    DOB: [birth year]   67 y.o.   MRN: [id number] Visit Date: 09/30/2018  Today's Provider: Lelon Huh, MD   Chief Complaint  Patient presents with  . Epistaxis    Chronic issue;  Marland Kitchen Gastroesophageal Reflux   Subjective:    Epistaxis   The bleeding has been from the right nare. This is a chronic problem. The problem occurs every several days. The problem has been gradually worsening. The bleeding is associated with anticoagulants. His past medical history is significant for frequent nosebleeds.   He was originally diagnosied with TIA when he developed expressive aphasia and right sided weakness 11/04/2016. Prior to event he was on 81mg  ECASA, but not statin. Was Plavix and atorvastatin were added per neurology and he has had no neuro sx since. He did have some muscle weakness after starting atorvastatin, which has greatly improved since adding coq10. However nosebleed are very frequent, has been cauterized by Dr. Tami Ribas, but continues to have several every week. Has been using antibiotic ointment which does help, but still very bothersome.     Allergies  Allergen Reactions  . Penicillins Nausea Only     Current Outpatient Medications:  .  aspirin 81 MG tablet, Take 81 mg by mouth daily. PM, Disp: , Rfl:  .  atorvastatin (LIPITOR) 40 MG tablet, Take 1 tablet (40 mg total) by mouth daily at 6 PM., Disp: 90 tablet, Rfl: 4 .  clopidogrel (PLAVIX) 75 MG tablet, Take 1 tablet (75 mg total) by mouth daily., Disp: 90 tablet, Rfl: 4 .  Coenzyme Q10 (COQ10) 100 MG CAPS, Take 2 capsules by mouth daily., Disp: , Rfl:  .  famotidine-calcium carbonate-magnesium hydroxide (PEPCID COMPLETE) 10-800-165 MG chewable tablet, Chew 1 tablet by mouth daily as needed., Disp: , Rfl:  .  gentamicin ointment (GARAMYCIN) 0.1 %, Apply 1 application topically as needed. Apply to affected area 3 times a day, Disp: , Rfl:  .  losartan (COZAAR) 25 MG tablet, TAKE 1 TABLET BY  MOUTH EVERY DAY, Disp: 90 tablet, Rfl: 4 .  Multiple Vitamin (MULTIVITAMIN) capsule, Take 1 capsule by mouth daily. PM, Disp: , Rfl:  .  Probiotic Product (PROBIOTIC DAILY PO), Take 1 capsule by mouth daily. PM, Disp: , Rfl:  .  traMADol (ULTRAM) 50 MG tablet, Take 1 tablet (50 mg total) by mouth every 8 (eight) hours as needed for moderate pain., Disp: 30 tablet, Rfl: 2 .  fluticasone (FLONASE) 50 MCG/ACT nasal spray, Place 2 sprays into both nostrils daily. , Disp: , Rfl:   Review of Systems  Constitutional: Negative.   HENT: Positive for nosebleeds.   Respiratory: Negative.   Cardiovascular: Negative.   Gastrointestinal: Negative.   Neurological: Negative for dizziness, light-headedness and headaches.    Social History   Tobacco Use  . Smoking status: Former Smoker    Last attempt to quit: 11/25/1970    Years since quitting: 47.8  . Smokeless tobacco: Never Used  Substance Use Topics  . Alcohol use: No   Objective:   BP 128/84 (BP Location: Right Arm, Patient Position: Sitting, Cuff Size: Large)   Pulse 68   Temp 98.3 F (36.8 C) (Oral)   Resp 16   Wt 203 lb (92.1 kg)   BMI 30.87 kg/m  Vitals:   09/30/18 0948  BP: 128/84  Pulse: 68  Resp: 16  Temp: 98.3 F (36.8 C)  TempSrc: Oral  Weight: 203 lb (92.1 kg)  Physical Exam   General Appearance:    Alert, cooperative, no distress  Eyes:    PERRL, conjunctiva/corneas clear, EOM's intact       Lungs:     Clear to auscultation bilaterally, respirations unlabored  Heart:    Regular rate and rhythm  Neurologic:   Awake, alert, oriented x 3. No apparent focal neurological           defect.           Assessment & Plan:     1. Epistaxis Secondary to aggressive antiplatelet therapy for previous TIA as below.   2. Essential hypertension Well controlled.  Continue current medications.   - EKG 12-Lead  3. TIA (transient ischemic attack) No sx since adding statin and clopidogrel to ECASA, but significant  difficulty with nosebleeds since being on clopidogrel. Will increase to maximum statin dose to minimize risk of CVA, change ECASA and clopidogrel to Aggrenox. Advised we could try generic dipryrimadole if Aggrenox is too expensive. Advised that CVA risk is significantly lower on high intensity statin, and may consider mono antiplatelet therapy with ASA.        Lelon Huh, MD  [facility]

## 2018-10-01 ENCOUNTER — Telehealth: Payer: Self-pay

## 2018-10-01 NOTE — Telephone Encounter (Signed)
Pt states he started Aggrenox 200/25mg  twice a day.  He states he is having trouble with headaches nausea and dizziness.  He was wondering if he could try a smaller dose to start and slowly increase to the current dose.     Contact Number: [phone]   Please advise,   -Mickel Baas

## 2018-10-01 NOTE — Telephone Encounter (Signed)
He could take one aggrenox at bedtime and one 81mg  coated aspirin in the morning for two weeks. Take this for three weeks, then change to aggrenox alone Q12 hours if headaches go away.

## 2018-10-01 NOTE — Telephone Encounter (Signed)
Patient advised and verbally voiced understanding.  

## 2018-10-12 ENCOUNTER — Telehealth: Payer: Self-pay | Admitting: Family Medicine

## 2018-10-12 NOTE — Telephone Encounter (Signed)
Patient came by the office to say he was having bad headaches and nausea again after taking 2 days of Dipyridamole (1/2) dose.   What does he need to do next?

## 2018-10-13 ENCOUNTER — Telehealth: Payer: Self-pay | Admitting: Family Medicine

## 2018-10-13 MED ORDER — ASPIRIN 325 MG PO TABS: 325.0000 mg | ORAL_TABLET | Freq: Every day | ORAL | Status: DC

## 2018-10-13 NOTE — Telephone Encounter (Signed)
Yes, he should be taking 2 40mg  tablets of atorvastatin daily

## 2018-10-13 NOTE — Telephone Encounter (Signed)
He can change to just aspirin, but will need to increase dose to 325mg  coated aspirin daily

## 2018-10-13 NOTE — Telephone Encounter (Signed)
Pt advised.   Thanks,   -  

## 2018-10-13 NOTE — Telephone Encounter (Signed)
Pt request Mickel Baas return his call. Pt stated that he has questions to the call he received earlier about changing his medications. Please advise. Thanks TNP

## 2018-10-13 NOTE — Telephone Encounter (Signed)
Pt is wanting to know if he should still double his atorvastatin.    Please advise.   Thanks,   -Mickel Baas

## 2018-10-14 NOTE — Telephone Encounter (Signed)
Patient was advised.  

## 2018-10-15 ENCOUNTER — Other Ambulatory Visit: Payer: Self-pay

## 2018-10-22 ENCOUNTER — Other Ambulatory Visit: Payer: Self-pay | Admitting: Family Medicine

## 2018-10-24 NOTE — Discharge Instructions (Signed)
°  Instructions after Total Knee Replacement ° ° Pearlie Lafosse P. Sheniqua Carolan, Jr., M.D.    ° Dept. of Orthopaedics & Sports Medicine ° [facility] ° [address] ° [place], [place]  [address] ° Phone: [phone]   Fax: [phone] ° °  °DIET: °• Drink plenty of non-alcoholic fluids. °• Resume your normal diet. Include foods high in fiber. ° °ACTIVITY:  °• You may use crutches or a walker with weight-bearing as tolerated, unless instructed otherwise. °• You may be weaned off of the walker or crutches by your Physical Therapist.  °• Do NOT place pillows under the knee. Anything placed under the knee could limit your ability to straighten the knee.   °• Continue doing gentle exercises. Exercising will reduce the pain and swelling, increase motion, and prevent muscle weakness.   °• Please continue to use the TED compression stockings for 6 weeks. You may remove the stockings at night, but should reapply them in the morning. °• Do not drive or operate any equipment until instructed. ° °WOUND CARE:  °• Continue to use the PolarCare or ice packs periodically to reduce pain and swelling. °• You may bathe or shower after the staples are removed at the first office visit following surgery. ° °MEDICATIONS: °• You may resume your regular medications. °• Please take the pain medication as prescribed on the medication. °• Do not take pain medication on an empty stomach. °• You have been given a prescription for a blood thinner (Lovenox or Coumadin). Please take the medication as instructed. (NOTE: After completing a 2 week course of Lovenox, take one Enteric-coated aspirin once a day. This along with elevation will help reduce the possibility of phlebitis in your operated leg.) °• Do not drive or drink alcoholic beverages when taking pain medications. ° °CALL THE OFFICE FOR: °• Temperature above 101 degrees °• Excessive bleeding or drainage on the dressing. °• Excessive swelling, coldness, or paleness of the toes. °• Persistent  nausea and vomiting. ° °FOLLOW-UP:  °• You should have an appointment to return to the office in 10-14 days after surgery. °• Arrangements have been made for continuation of Physical Therapy (either home therapy or outpatient therapy). °  °

## 2018-11-03 ENCOUNTER — Encounter: Payer: Self-pay | Admitting: Family Medicine

## 2018-11-03 ENCOUNTER — Ambulatory Visit (INDEPENDENT_AMBULATORY_CARE_PROVIDER_SITE_OTHER): Payer: Medicare Other | Admitting: Family Medicine

## 2018-11-03 VITALS — BP 126/74 | HR 60 | Temp 98.3°F | Resp 16 | Wt 202.0 lb

## 2018-11-03 DIAGNOSIS — R040 Epistaxis: Secondary | ICD-10-CM | POA: Diagnosis not present

## 2018-11-03 DIAGNOSIS — I10 Essential (primary) hypertension: Secondary | ICD-10-CM

## 2018-11-03 DIAGNOSIS — G459 Transient cerebral ischemic attack, unspecified: Secondary | ICD-10-CM

## 2018-11-03 DIAGNOSIS — Z125 Encounter for screening for malignant neoplasm of prostate: Secondary | ICD-10-CM | POA: Diagnosis not present

## 2018-11-03 DIAGNOSIS — G4733 Obstructive sleep apnea (adult) (pediatric): Secondary | ICD-10-CM

## 2018-11-03 DIAGNOSIS — Z01818 Encounter for other preprocedural examination: Secondary | ICD-10-CM

## 2018-11-03 DIAGNOSIS — I77811 Abdominal aortic ectasia: Secondary | ICD-10-CM | POA: Diagnosis not present

## 2018-11-03 MED ORDER — ATORVASTATIN CALCIUM 80 MG PO TABS: 80.0000 mg | ORAL_TABLET | Freq: Every day | ORAL | 3 refills | Status: DC

## 2018-11-03 NOTE — Progress Notes (Signed)
Patient: James Compton Male    DOB: Nov 25, 1951   67 y.o.   MRN: [id number] Visit Date: 11/03/2018  Today's Provider: Lelon Huh, MD   Chief Complaint  Patient presents with  . Epistaxis  . Hyperlipidemia  . Transient Ischemic Attack  . Sleep Apnea   Subjective:    HPI   Pt presents today to follow up with nosebleeds secondary to Plavix.  Last office visit (09/30/2018) pt was advised to stop Plavix and prescribed Aggrenox.  He also increased his Atorvastatin to 80mg  a day.  Pt later called reporting the Aggrenox was causing side effects of nausea and headaches and was changed full strength 325mg  ECASA daily which he is tolerating well. He is also on 40mg  omeprazole chronically prescribed by ENT  Pt now reports good tolerance to the medication changes and also has not had a nosebleed since November 21th.    He also comes in today to follow up for Sleep Apnea.  He has been using his Cpap machine since February.  Pt reports he uses his CPAP regularly with good symptom control.  He feels rested in the mornings. Has much more energy and does not doze off during they like he did before starting CPAP. He is using every nigh for 6-8 hours.   He is scheduled for  Knee replacement in January by Dr. Marry Guan. He states he has not had any significantly surgical complications with most recent surgeries.  He denies any chest pains or shortness of breath. He had normal EKG in November.  Past Surgical History:  Procedure Laterality Date  . CHOLECYSTECTOMY  2007  . COLONOSCOPY N/A 04/14/2015   Procedure: COLONOSCOPY;  Surgeon: Lucilla Lame, MD;  Location: [facility];  Service: Gastroenterology;  Laterality: N/A;  . KNEE ARTHROSCOPY Right 2007  . KNEE ARTHROSCOPY WITH MEDIAL MENISECTOMY  04/02/2017   Procedure: KNEE ARTHROSCOPY WITH MEDIAL MENISECTOMY CHONDRAPLASTY, SYNOVECTOMY;  Surgeon: Dereck Leep, MD;  Location: [facility];  Service: Orthopedics;;  . POLYPECTOMY  04/14/2015   Procedure: POLYPECTOMY INTESTINAL;  Surgeon: Lucilla Lame, MD;  Location: [facility];  Service: Gastroenterology;;      Allergies  Allergen Reactions  . Penicillins Nausea Only     Current Outpatient Medications:  .  aspirin (BAYER ASPIRIN) 325 MG tablet, Take 1 tablet (325 mg total) by mouth daily., Disp: , Rfl:  .  atorvastatin (LIPITOR) 80 MG tablet, Take 1 tablet (80 mg total) by mouth daily at 6 PM., Disp: 90 tablet, Rfl: 3 .  Coenzyme Q10 (COQ10) 100 MG CAPS, Take 2 capsules by mouth daily., Disp: , Rfl:  .  fluticasone (FLONASE) 50 MCG/ACT nasal spray, Place 2 sprays into both nostrils daily. , Disp: , Rfl:  .  gentamicin ointment (GARAMYCIN) 0.1 %, Apply 1 application topically as needed. Apply to affected area 3 times a day, Disp: , Rfl:  .  losartan (COZAAR) 25 MG tablet, TAKE 1 TABLET BY MOUTH EVERY DAY, Disp: 90 tablet, Rfl: 4 .  Multiple Vitamin (MULTIVITAMIN) capsule, Take 1 capsule by mouth daily. PM, Disp: , Rfl:  .  omeprazole (PRILOSEC) 40 MG capsule, Take 40 mg by mouth daily., Disp: , Rfl:  .  Probiotic Product (PROBIOTIC DAILY PO), Take 1 capsule by mouth daily. PM, Disp: , Rfl:  .  traMADol (ULTRAM) 50 MG tablet, Take 1 tablet (50 mg total) by mouth every 8 (eight) hours as needed for moderate pain., Disp: 30 tablet, Rfl: 2  Review  of Systems  Constitutional: Negative.   HENT: Negative for nosebleeds.   Respiratory: Negative.   Cardiovascular: Negative.   Gastrointestinal: Negative.   Neurological: Negative for dizziness, light-headedness and headaches.    Social History   Tobacco Use  . Smoking status: Former Smoker    Last attempt to quit: 11/25/1970    Years since quitting: 47.9  . Smokeless tobacco: Never Used  Substance Use Topics  . Alcohol use: No   Objective:   BP 126/74 (BP Location: Right Arm, Patient Position: Sitting, Cuff Size: Large)   Pulse 60   Temp 98.3 F (36.8 C) (Oral)   Resp 16   Wt 202 lb (91.6 kg)   BMI 30.71 kg/m    Vitals:   11/03/18 0821  BP: 126/74  Pulse: 60  Resp: 16  Temp: 98.3 F (36.8 C)  TempSrc: Oral  Weight: 202 lb (91.6 kg)     Physical Exam   General Appearance:    Alert, cooperative, no distress  Eyes:    PERRL, conjunctiva/corneas clear, EOM's intact       Lungs:     Clear to auscultation bilaterally, respirations unlabored  Heart:    Regular rate and rhythm  Neurologic:   Awake, alert, oriented x 3. No apparent focal neurological           defect.          Office Visit from 11/03/2018 in [facility]  PHQ-2 Total Score  0         Assessment & Plan:     1. Essential hypertension Well controlled.  Continue current medications.    2. Epistaxis Resolved since discontinuation of clopidogrel. Tolerating 325mg  ECASA well.   3. Pre-op evaluation Patient has no absolute or relative contraindications to planned surgery and anesthesia including general anesthesia. He is at low risk for cardiac and pulmonary complications. Anticipates pre-op labs being done at upcoming appointment. If labs normal then no additional medical precautions recommended.   4. TIA (transient ischemic attack) None since being on antiplatelet therapy. Intolerant to clopidogrel due to excessive nosebleeds and intolerant to DIPYRIDAMOLE. discucssed superior stroke reduction risk with dipyridamole which he does not tolerate. Also discussed stroke reduction risk with maximum atorvastatin dose which he is tolerating well. Will continue 80mg  atorvastatin, 325mg  ECASA and advised he should stay on daily PPI so long as he is on 325mg  a day of ASA.   5. OSA (obstructive sleep apnea) Doing well, compliant with, and medically benefiting from CPAP.   6. Aortic ectasia, abdominal ([place]) Noted on aortic screening in 2015 with recommendation to follow up in 5 years.  - US Aorta; Future  7. Prostate cancer screening  - PSA      Lelon Huh, MD  [facility]

## 2018-11-04 ENCOUNTER — Telehealth: Payer: Self-pay

## 2018-11-04 LAB — PSA: Prostate Specific Ag, Serum: 1 ng/mL (ref 0.0–4.0)

## 2018-11-04 NOTE — Telephone Encounter (Signed)
Pt advised.   Thanks,   -Makih Stefanko  

## 2018-11-04 NOTE — Telephone Encounter (Signed)
Tried calling; no answer.   Thanks,   -Laura  

## 2018-11-04 NOTE — Telephone Encounter (Signed)
-----   Message from [name], MD sent at 11/04/2018  7:43 AM EST ----- PSA is normal at 1.0. Check yearly.

## 2018-11-05 ENCOUNTER — Ambulatory Visit: Payer: Self-pay

## 2018-11-09 ENCOUNTER — Ambulatory Visit
Admission: RE | Admit: 2018-11-09 | Discharge: 2018-11-09 | Disposition: A | Discharge status: Home / Self Care | Payer: Medicare Other | Source: Ambulatory Visit | Attending: Family Medicine | Admitting: Family Medicine

## 2018-11-09 DIAGNOSIS — I77811 Abdominal aortic ectasia: Secondary | ICD-10-CM | POA: Diagnosis not present

## 2018-11-09 DIAGNOSIS — I714 Abdominal aortic aneurysm, without rupture: Secondary | ICD-10-CM | POA: Diagnosis not present

## 2018-11-11 ENCOUNTER — Encounter
Admission: RE | Admit: 2018-11-11 | Discharge: 2018-11-11 | Disposition: A | Discharge status: Home / Self Care | Payer: Medicare Other | Source: Ambulatory Visit | Attending: Orthopedic Surgery | Admitting: Orthopedic Surgery

## 2018-11-11 ENCOUNTER — Other Ambulatory Visit: Payer: Self-pay

## 2018-11-11 DIAGNOSIS — Z01818 Encounter for other preprocedural examination: Secondary | ICD-10-CM | POA: Insufficient documentation

## 2018-11-11 HISTORY — DX: Essential (primary) hypertension: I10

## 2018-11-11 HISTORY — DX: Pneumonia, unspecified organism: J18.9

## 2018-11-11 HISTORY — DX: Sleep apnea, unspecified: G47.30

## 2018-11-11 LAB — TYPE AND SCREEN
ABO/RH(D): A POS
Antibody Screen: NEGATIVE

## 2018-11-11 LAB — COMPREHENSIVE METABOLIC PANEL
ALT: 17 U/L (ref 0–44)
AST: 29 U/L (ref 15–41)
Albumin: 5 g/dL (ref 3.5–5.0)
Alkaline Phosphatase: 126 U/L (ref 38–126)
Anion gap: 9 (ref 5–15)
BUN: 13 mg/dL (ref 8–23)
CO2: 25 mmol/L (ref 22–32)
CREATININE: 0.79 mg/dL (ref 0.61–1.24)
Calcium: 8.8 mg/dL — ABNORMAL LOW (ref 8.9–10.3)
Chloride: 106 mmol/L (ref 98–111)
GFR calc non Af Amer: >60 mL/min (ref >60)
Glucose, Bld: 105 mg/dL — ABNORMAL HIGH (ref 70–99)
Potassium: 3.4 mmol/L — ABNORMAL LOW (ref 3.5–5.1)
Sodium: 140 mmol/L (ref 135–145)
Total Bilirubin: 1.8 mg/dL — ABNORMAL HIGH (ref 0.3–1.2)
Total Protein: 7.9 g/dL (ref 6.5–8.1)

## 2018-11-11 LAB — CBC
HCT: 44.3 % (ref 39.0–52.0)
Hemoglobin: 15.3 g/dL (ref 13.0–17.0)
MCH: 30.7 pg (ref 26.0–34.0)
MCHC: 34.5 g/dL (ref 30.0–36.0)
MCV: 88.8 fL (ref 80.0–100.0)
Platelets: 201 10*3/uL (ref 150–400)
RBC: 4.99 MIL/uL (ref 4.22–5.81)
RDW: 12.7 % (ref 11.5–15.5)
WBC: 6.7 10*3/uL (ref 4.0–10.5)
nRBC: 0 % (ref 0.0–0.2)

## 2018-11-11 LAB — URINALYSIS, ROUTINE W REFLEX MICROSCOPIC
Bilirubin Urine: NEGATIVE
Glucose, UA: NEGATIVE mg/dL
Hgb urine dipstick: NEGATIVE
Ketones, ur: NEGATIVE mg/dL
Leukocytes, UA: NEGATIVE
Nitrite: NEGATIVE
Protein, ur: NEGATIVE mg/dL
Specific Gravity, Urine: 1.001 — ABNORMAL LOW (ref 1.005–1.030)
pH: 6 (ref 5.0–8.0)

## 2018-11-11 LAB — SEDIMENTATION RATE: Sed Rate: 4 mm/hr (ref 0–20)

## 2018-11-11 LAB — HEMOGLOBIN A1C
Hgb A1c MFr Bld: 4.8 % (ref 4.8–5.6)
Mean Plasma Glucose: 91.06 mg/dL

## 2018-11-11 LAB — APTT: aPTT: 28 seconds (ref 24–36)

## 2018-11-11 LAB — C-REACTIVE PROTEIN: CRP: 0.9 mg/dL (ref <1.0)

## 2018-11-11 LAB — SURGICAL PCR SCREEN
MRSA, PCR: NEGATIVE
STAPHYLOCOCCUS AUREUS: NEGATIVE

## 2018-11-11 LAB — PROTIME-INR
INR: 0.96
Prothrombin Time: 12.7 seconds (ref 11.4–15.2)

## 2018-11-11 NOTE — Patient Instructions (Signed)
  Your procedure is scheduled on: Monday November 30, 2018 Report to Same Day Surgery 2nd floor [facility] Entrance-take elevator on left to 2nd floor.  Check in with surgery information desk.) To find out your arrival time, call [phone] 1:00-3:00 PM on Friday November 27, 2018  Remember: Instructions that are not followed completely may result in serious medical risk, up to and including death, or upon the discretion of your surgeon and anesthesiologist your surgery may need to be rescheduled.    __x__ 1. Do not eat food (including mints, candies, chewing gum) after midnight the night before your procedure. You may drink clear liquids up to 2 hours before you are scheduled to arrive at the hospital for your procedure.  Do not drink anything within 2 hours of your scheduled arrival to the hospital.  Approved clear liquids:  --Water or Apple juice without pulp  --Clear carbohydrate beverage such as Gatorade or Powerade  --Black Coffee or Clear Tea (No milk, no creamers, do not add anything to the coffee or tea)    __x__ 2. No Alcohol for 24 hours before or after surgery.   __x__ 3. No Smoking or e-cigarettes for 24 hours before surgery.  Do not use any chewable tobacco products for at least 6 hours before surgery.   __x__ 4. Notify your doctor if there is any change in your medical condition (cold, fever, infections).   __x__ 5. On the morning of surgery brush your teeth with toothpaste and water.  You may rinse your mouth with mouthwash if you wish.  Do not swallow any toothpaste or mouthwash.  Please read over the following fact sheets that you were given:   [facility] Preparing for Surgery and/or MRSA Information    __x__ Use CHG Soap or Sage wipes as directed on instruction sheet.   Do not wear jewelry on the day of surgery.  Do not wear lotions, powders, deodorant, or perfumes.   Do not shave below the face/neck 48 hours prior to surgery.   Do not bring valuables  to the hospital.    [facility] is not responsible for any belongings or valuables.               Contacts, hearing aids, dentures or bridgework may not be worn into surgery.  Leave your suitcase in the car. After surgery it may be brought to your room.  For patients admitted to the hospital, discharge time is determined by your treatment team.  __x__ Take anti-hypertensive listed below, cardiac, seizure, asthma, anti-reflux and psychiatric medicines on the morning of surgery with a small sip of water. These include:  1. Omeprazole (Prilosec)   Do not take your Losartan (Cozaar) on the morning of surgery.  __x__ Bring C-Pap/Bi-Pap machine to the hospital.   __x__ Follow recommendations from Cardiologist, Pulmonologist or PCP regarding stopping blood thinners such as Aspirin, Coumadin, Plavix, Eliquis, Effient, Pradaxa, and Pletal.  __x__ At least 7 days before surgery: Stop Anti-inflammatories such as Advil, Ibuprofen, Motrin, Aleve, Naproxen, Naprosyn, BC/Goodies powders. You may continue to take Tylenol or Tramadol if needed.   __x__ At least 7 days before surgery: Stop supplements (CoQ10, Probiotic) until after surgery. You may continue to take Vitamin D, Vitamin B, and multivitamin.

## 2018-11-12 LAB — URINE CULTURE
Culture: NO GROWTH
Special Requests: NORMAL

## 2018-11-13 LAB — IGE: IgE (Immunoglobulin E), Serum: 143 IU/mL (ref 6–495)

## 2018-11-20 ENCOUNTER — Ambulatory Visit (INDEPENDENT_AMBULATORY_CARE_PROVIDER_SITE_OTHER): Payer: Medicare Other

## 2018-11-20 ENCOUNTER — Telehealth: Payer: Self-pay

## 2018-11-20 VITALS — BP 128/62 | HR 65 | Temp 98.7°F | Ht 68.0 in | Wt 206.6 lb

## 2018-11-20 DIAGNOSIS — Z0000 Encounter for general adult medical examination without abnormal findings: Secondary | ICD-10-CM

## 2018-11-20 NOTE — Telephone Encounter (Signed)
Please advise 

## 2018-11-20 NOTE — Patient Instructions (Addendum)
[name] , Thank you for taking time to come for your Medicare Wellness Visit. I appreciate your ongoing commitment to your health goals. Please review the following plan we discussed and let me know if I can assist you in the future.   Screening recommendations/referrals: Colonoscopy: Up to date, due 03/2025 Recommended yearly ophthalmology/optometry visit for glaucoma screening and checkup Recommended yearly dental visit for hygiene and checkup  Vaccinations: Influenza vaccine: Up to date Pneumococcal vaccine: Completed series Tdap vaccine: Up to date, due 09/2020 Shingles vaccine: Pt declines today.     Advanced directives: Currently on file.  Conditions/risks identified: Recommend to cut back on heavy carbohydrates to help aid in weight loss.   Next appointment: 02/01/19 @ 9:00 AM with Dr Caryn Section.   Preventive Care 44 Years and Older, Male Preventive care refers to lifestyle choices and visits with your health care provider that can promote health and wellness. What does preventive care include?  A yearly physical exam. This is also called an annual well check.  Dental exams once or twice a year.  Routine eye exams. Ask your health care provider how often you should have your eyes checked.  Personal lifestyle choices, including:  Daily care of your teeth and gums.  Regular physical activity.  Eating a healthy diet.  Avoiding tobacco and drug use.  Limiting alcohol use.  Practicing safe sex.  Taking low doses of aspirin every day.  Taking vitamin and mineral supplements as recommended by your health care provider. What happens during an annual well check? The services and screenings done by your health care provider during your annual well check will depend on your age, overall health, lifestyle risk factors, and family history of disease. Counseling  Your health care provider may ask you questions about your:  Alcohol use.  Tobacco use.  Drug  use.  Emotional well-being.  Home and relationship well-being.  Sexual activity.  Eating habits.  History of falls.  Memory and ability to understand (cognition).  Work and work Statistician. Screening  You may have the following tests or measurements:  Height, weight, and BMI.  Blood pressure.  Lipid and cholesterol levels. These may be checked every 5 years, or more frequently if you are over 30 years old.  Skin check.  Lung cancer screening. You may have this screening every year starting at age 39 if you have a 30-pack-year history of smoking and currently smoke or have quit within the past 15 years.  Fecal occult blood test (FOBT) of the stool. You may have this test every year starting at age 71.  Flexible sigmoidoscopy or colonoscopy. You may have a sigmoidoscopy every 5 years or a colonoscopy every 10 years starting at age 22.  Prostate cancer screening. Recommendations will vary depending on your family history and other risks.  Hepatitis C blood test.  Hepatitis B blood test.  Sexually transmitted disease (STD) testing.  Diabetes screening. This is done by checking your blood sugar (glucose) after you have not eaten for a while (fasting). You may have this done every 1-3 years.  Abdominal aortic aneurysm (AAA) screening. You may need this if you are a current or former smoker.  Osteoporosis. You may be screened starting at age 72 if you are at high risk. Talk with your health care provider about your test results, treatment options, and if necessary, the need for more tests. Vaccines  Your health care provider may recommend certain vaccines, such as:  Influenza vaccine. This is recommended every year.  Tetanus, diphtheria, and acellular pertussis (Tdap, Td) vaccine. You may need a Td booster every 10 years.  Zoster vaccine. You may need this after age 11.  Pneumococcal 13-valent conjugate (PCV13) vaccine. One dose is recommended after age  6.  Pneumococcal polysaccharide (PPSV23) vaccine. One dose is recommended after age 69. Talk to your health care provider about which screenings and vaccines you need and how often you need them. This information is not intended to replace advice given to you by your health care provider. Make sure you discuss any questions you have with your health care provider. Document Released: 12/08/2015 Document Revised: 07/31/2016 Document Reviewed: 09/12/2015 Elsevier Interactive Patient Education  2017 [place] Prevention in the Home Falls can cause injuries. They can happen to people of all ages. There are many things you can do to make your home safe and to help prevent falls. What can I do on the outside of my home?  Regularly fix the edges of walkways and driveways and fix any cracks.  Remove anything that might make you trip as you walk through a door, such as a raised step or threshold.  Trim any bushes or trees on the path to your home.  Use bright outdoor lighting.  Clear any walking paths of anything that might make someone trip, such as rocks or tools.  Regularly check to see if handrails are loose or broken. Make sure that both sides of any steps have handrails.  Any raised decks and porches should have guardrails on the edges.  Have any leaves, snow, or ice cleared regularly.  Use sand or salt on walking paths during winter.  Clean up any spills in your garage right away. This includes oil or grease spills. What can I do in the bathroom?  Use night lights.  Install grab bars by the toilet and in the tub and shower. Do not use towel bars as grab bars.  Use non-skid mats or decals in the tub or shower.  If you need to sit down in the shower, use a plastic, non-slip stool.  Keep the floor dry. Clean up any water that spills on the floor as soon as it happens.  Remove soap buildup in the tub or shower regularly.  Attach bath mats securely with double-sided  non-slip rug tape.  Do not have throw rugs and other things on the floor that can make you trip. What can I do in the bedroom?  Use night lights.  Make sure that you have a light by your bed that is easy to reach.  Do not use any sheets or blankets that are too big for your bed. They should not hang down onto the floor.  Have a firm chair that has side arms. You can use this for support while you get dressed.  Do not have throw rugs and other things on the floor that can make you trip. What can I do in the kitchen?  Clean up any spills right away.  Avoid walking on wet floors.  Keep items that you use a lot in easy-to-reach places.  If you need to reach something above you, use a strong step stool that has a grab bar.  Keep electrical cords out of the way.  Do not use floor polish or wax that makes floors slippery. If you must use wax, use non-skid floor wax.  Do not have throw rugs and other things on the floor that can make you trip. What can I do  with my stairs?  Do not leave any items on the stairs.  Make sure that there are handrails on both sides of the stairs and use them. Fix handrails that are broken or loose. Make sure that handrails are as long as the stairways.  Check any carpeting to make sure that it is firmly attached to the stairs. Fix any carpet that is loose or worn.  Avoid having throw rugs at the top or bottom of the stairs. If you do have throw rugs, attach them to the floor with carpet tape.  Make sure that you have a light switch at the top of the stairs and the bottom of the stairs. If you do not have them, ask someone to add them for you. What else can I do to help prevent falls?  Wear shoes that:  Do not have high heels.  Have rubber bottoms.  Are comfortable and fit you well.  Are closed at the toe. Do not wear sandals.  If you use a stepladder:  Make sure that it is fully opened. Do not climb a closed stepladder.  Make sure that both  sides of the stepladder are locked into place.  Ask someone to hold it for you, if possible.  Clearly mark and make sure that you can see:  Any grab bars or handrails.  First and last steps.  Where the edge of each step is.  Use tools that help you move around (mobility aids) if they are needed. These include:  Canes.  Walkers.  Scooters.  Crutches.  Turn on the lights when you go into a dark area. Replace any light bulbs as soon as they burn out.  Set up your furniture so you have a clear path. Avoid moving your furniture around.  If any of your floors are uneven, fix them.  If there are any pets around you, be aware of where they are.  Review your medicines with your doctor. Some medicines can make you feel dizzy. This can increase your chance of falling. Ask your doctor what other things that you can do to help prevent falls. This information is not intended to replace advice given to you by your health care provider. Make sure you discuss any questions you have with your health care provider. Document Released: 09/07/2009 Document Revised: 04/18/2016 Document Reviewed: 12/16/2014 Elsevier Interactive Patient Education  2017 [employer].

## 2018-11-20 NOTE — Progress Notes (Signed)
Subjective:   James Compton is a 67 y.o. male who presents for Medicare Annual/Subsequent preventive examination.  Review of Systems:  N/A  Cardiac Risk Factors include: advanced age (>43men, >41 women);dyslipidemia;hypertension;male gender;obesity (BMI >30kg/m2)     Objective:    Vitals: BP 128/62 (BP Location: Right Arm)   Pulse 65   Temp 98.7 F (37.1 C) (Oral)   Ht 5\' 8"  (1.727 m)   Wt 206 lb 9.6 oz (93.7 kg)   BMI 31.41 kg/m   Body mass index is 31.41 kg/m.  Advanced Directives 11/20/2018 11/11/2018 10/31/2017 04/05/2017 04/02/2017 03/17/2017 11/03/2016  Does Patient Have a Medical Advance Directive? Yes Yes Yes Yes Yes Yes No  Type of Paramedic of [place];Living will [place];Living will [place];Living will [place];Living will [place];Living will [place];Living will -  Does patient want to make changes to medical advance directive? - No - Patient declined - No - Patient declined - No - Patient declined -  Copy of [place] in Chart? Yes - validated most recent copy scanned in chart (See row information) Yes - validated most recent copy scanned in chart (See row information) Yes Yes Yes Yes -  Would patient like information on creating a medical advance directive? - - - - - - No - Patient declined  Pre-existing out of facility DNR order (yellow form or pink MOST form) - - (No Data) - - - -    Tobacco Social History   Tobacco Use  Smoking Status Former Smoker  . Last attempt to quit: 11/25/1970  . Years since quitting: 48.0  Smokeless Tobacco Never Used     Counseling given: Not Answered   Clinical Intake:  Pre-visit preparation completed: Yes  Pain : 0-10 Pain Score: 1  Pain Type: Chronic pain Pain Location: Knee Pain Orientation: Right Pain Descriptors / Indicators: Stabbing Pain Frequency: Intermittent      Nutritional Status: BMI > 30  Obese Nutritional Risks: None Diabetes: No  How often do you need to have someone help you when you read instructions, pamphlets, or other written materials from your doctor or pharmacy?: 1 - Never  Interpreter Needed?: No  Information entered by :: [facility], LPN  Past Medical History:  Diagnosis Date  . Arthritis    Osteo - knees  . Complication of anesthesia    bowels "feel asleep" after knee surgery  . GERD (gastroesophageal reflux disease)    occas  . Hypertension   . Pneumonia   . PONV (postoperative nausea and vomiting)   . Sleep apnea   . Status post arthroscopy 04/11/2017   Right Knee May 2018 Dr. Marry Guan  . TIA (transient ischemic attack) 11/03/2016  . Vertigo 01/2011   1 time event, pt had drank several cups of coffee that day.  . Wears hearing aid    bilateral  . Wears partial dentures    upper   Past Surgical History:  Procedure Laterality Date  . CHOLECYSTECTOMY  2007  . COLONOSCOPY N/A 04/14/2015   Procedure: COLONOSCOPY;  Surgeon: Lucilla Lame, MD;  Location: [facility];  Service: Gastroenterology;  Laterality: N/A;  . KNEE ARTHROSCOPY Right 2007  . KNEE ARTHROSCOPY WITH MEDIAL MENISECTOMY  04/02/2017   Procedure: KNEE ARTHROSCOPY WITH MEDIAL MENISECTOMY CHONDRAPLASTY, SYNOVECTOMY;  Surgeon: Dereck Leep, MD;  Location: [facility];  Service: Orthopedics;;  . POLYPECTOMY  04/14/2015   Procedure: POLYPECTOMY INTESTINAL;  Surgeon: Lucilla Lame, MD;  Location: [facility];  Service: Gastroenterology;;   Family History  Problem Relation Age of Onset  . Alzheimer's disease Mother   . Diabetes Father        pre diabetic  . Healthy Sister   . Healthy Brother   . Healthy Daughter   . Healthy Son   . Healthy Daughter    Social History   Socioeconomic History  . Marital status: Married    Spouse name: Not on file  . Number of children: 3  . Years of education: Not on file  . Highest education level: Some  college, no degree  Occupational History  . Not on file  Social Needs  . Financial resource strain: Not hard at all  . Food insecurity:    Worry: Never true    Inability: Never true  . Transportation needs:    Medical: No    Non-medical: No  Tobacco Use  . Smoking status: Former Smoker    Last attempt to quit: 11/25/1970    Years since quitting: 48.0  . Smokeless tobacco: Never Used  Substance and Sexual Activity  . Alcohol use: No  . Drug use: No  . Sexual activity: Not on file  Lifestyle  . Physical activity:    Days per week: 0 days    Minutes per session: 0 min  . Stress: Only a little  Relationships  . Social connections:    Talks on phone: Patient refused    Gets together: Patient refused    Attends religious service: Patient refused    Active member of club or organization: Patient refused    Attends meetings of clubs or organizations: Patient refused    Relationship status: Patient refused  Other Topics Concern  . Not on file  Social History Narrative  . Not on file    Outpatient Encounter Medications as of 11/20/2018  Medication Sig  . acetaminophen (TYLENOL) 500 MG tablet Take 1,000 mg by mouth every 8 (eight) hours as needed (pain).  Marland Kitchen aspirin (BAYER ASPIRIN) 325 MG tablet Take 1 tablet (325 mg total) by mouth daily.  Marland Kitchen atorvastatin (LIPITOR) 80 MG tablet Take 1 tablet (80 mg total) by mouth daily at 6 PM.  . Coenzyme Q10 (COQ10) 100 MG CAPS Take 2 capsules by mouth daily.  . fluticasone (FLONASE) 50 MCG/ACT nasal spray Place 2 sprays into both nostrils daily as needed for allergies or rhinitis.   Marland Kitchen gentamicin ointment (GARAMYCIN) 0.1 % Apply 1 application topically 2 (two) times daily. Apply to affected area 3 times a day  . losartan (COZAAR) 25 MG tablet TAKE 1 TABLET BY MOUTH EVERY DAY  . Multiple Vitamin (MULTIVITAMIN) capsule Take 1 capsule by mouth daily. PM  . omeprazole (PRILOSEC) 40 MG capsule Take 40 mg by mouth daily.  . Probiotic Product  (PROBIOTIC DAILY PO) Take 1 capsule by mouth daily. PM  . traMADol (ULTRAM) 50 MG tablet Take 1 tablet (50 mg total) by mouth every 8 (eight) hours as needed for moderate pain.   No facility-administered encounter medications on file as of 11/20/2018.     Activities of Daily Living In your present state of health, do you have any difficulty performing the following activities: 11/20/2018 11/11/2018  Hearing? [name]  Comment Wears bilateral hearing aids.  -  Vision? N N  Difficulty concentrating or making decisions? Y N  Walking or climbing stairs? Y Y  Comment Due to right knee pain.  -  Dressing  or bathing? N N  Doing errands, shopping? N N  Preparing Food and eating ? N -  Using the Toilet? N -  In the past six months, have you accidently leaked urine? N -  Do you have problems with loss of bowel control? N -  Managing your Medications? N -  Managing your Finances? N -  Housekeeping or managing your Housekeeping? N -  Some recent data might be hidden    Patient Care Team: [name], MD as PCP - General (Family Medicine) Ubaldo Glassing, Javier Docker, MD as Consulting Physician (Cardiology) Hooten, Laurice Record, MD as Consulting Physician (Orthopedic Surgery) Beverly Gust, MD as Consulting Physician (Otolaryngology)   Assessment:   This is a routine wellness examination for James Compton.  Exercise Activities and Dietary recommendations Current Exercise Habits: The patient does not participate in regular exercise at present, Exercise limited by: orthopedic condition(s)  Goals    . DIET - REDUCE CALORIE INTAKE     Recommend to cut back on heavy carbohydrates to help aid in weight loss.     Marland Kitchen DIET - REDUCE SALT INTAKE     Recommend to continue cutting out all salt in daily diet.        Fall Risk Fall Risk  11/20/2018 11/03/2018 10/15/2018 10/31/2017 10/21/2016  Falls in the past year? 0 0 0 No No  Comment - - Emmi Telephone Survey: data to providers prior to load - -  Number falls in  past yr: 0 - - - -  Injury with Fall? 0 - - - -   FALL RISK PREVENTION PERTAINING TO THE HOME:  Any stairs in or around the home WITH handrails? No  Home free of loose throw rugs in walkways, pet beds, electrical cords, etc? Yes  Adequate lighting in your home to reduce risk of falls? Yes   ASSISTIVE DEVICES UTILIZED TO PREVENT FALLS:  Life alert? No  Use of a cane, walker or w/c? Yes , occasional use Grab bars in the bathroom? No  Shower chair or bench in shower? Yes  Elevated toilet seat or a handicapped toilet? Yes    TIMED UP AND GO:  Was the test performed? No .    Depression Screen PHQ 2/9 Scores 11/20/2018 11/03/2018 10/31/2017 10/21/2016  PHQ - 2 Score 0 0 0 0    Cognitive Function: Declined today.      6CIT Screen 10/31/2017  What Year? 0 points  What month? 0 points  What time? 0 points  Count back from 20 0 points  Months in reverse 2 points  Repeat phrase 0 points  Total Score 2    Immunization History  Administered Date(s) Administered  . Influenza, High Dose Seasonal PF 08/26/2017, 09/24/2018  . Influenza-Unspecified 09/25/2016  . Pneumococcal Conjugate-13 01/19/2014  . Pneumococcal Polysaccharide-23 03/24/2015  . Td 07/04/2004, 09/25/2010  . Tdap 09/25/2010  . Zoster 09/17/2011    Qualifies for Shingles Vaccine? Yes  Zostavax completed 09/17/11. Due for Shingrix. Education has been provided regarding the importance of this vaccine. Pt has been advised to call insurance company to determine out of pocket expense. Advised may also receive vaccine at local pharmacy or Health Dept. Verbalized acceptance and understanding.  Tdap: Up to date  Flu Vaccine: Up to date  Pneumococcal Vaccine: Up to date   Screening Tests Health Maintenance  Topic Date Due  . TETANUS/TDAP  09/25/2020  . COLONOSCOPY  04/13/2025  . INFLUENZA VACCINE  Completed  . Hepatitis C Screening  Completed  .  PNA vac Low Risk Adult  Completed   Cancer  Screenings:  Colorectal Screening: Completed 04/14/15. Repeat every 10 years.  Lung Cancer Screening: (Low Dose CT Chest recommended if Age 75-80 years, 30 pack-year currently smoking OR have quit w/in 15years.) does not qualify.    Additional Screening:  Hepatitis C Screening: Up to date  Vision Screening: Recommended annual ophthalmology exams for early detection of glaucoma and other disorders of the eye.  Dental Screening: Recommended annual dental exams for proper oral hygiene  Community Resource Referral:  CRR required this visit?  No        Plan:  I have personally reviewed and addressed the Medicare Annual Wellness questionnaire and have noted the following in the patient's chart:  A. Medical and social history B. Use of alcohol, tobacco or illicit drugs  C. Current medications and supplements D. Functional ability and status E.  Nutritional status F.  Physical activity G. Advance directives H. List of other physicians I.  Hospitalizations, surgeries, and ER visits in previous 12 months J.  [place] such as hearing and vision if needed, cognitive and depression L. Referrals and appointments - none  In addition, I have reviewed and discussed with patient certain preventive protocols, quality metrics, and best practice recommendations. A written personalized care plan for preventive services as well as general preventive health recommendations were provided to patient.  See attached scanned questionnaire for additional information.   Signed,  Fabio Neighbors, LPN Nurse Health Advisor   Nurse Recommendations: None.

## 2018-11-20 NOTE — Telephone Encounter (Signed)
Advised patient as below.  

## 2018-11-20 NOTE — Telephone Encounter (Signed)
Stop aspirin 1 week before surgery (last dose on 11/24/2018)

## 2018-11-20 NOTE — Telephone Encounter (Signed)
Pt was seen for his AWV today and would like to know when he needs to stop his Aspirin 325 mg prior to his sx on 11/30/17? Pt has a pre-op apt on on 11/24/18 but states he was referred back to his PCP about this last year. Ok to leave a detailed message on VM. Please advise, thank you. -MM

## 2018-11-24 DIAGNOSIS — M25561 Pain in right knee: Secondary | ICD-10-CM | POA: Diagnosis not present

## 2018-11-30 ENCOUNTER — Other Ambulatory Visit: Payer: Self-pay

## 2018-11-30 ENCOUNTER — Inpatient Hospital Stay
Admission: RE | Admit: 2018-11-30 | Discharge: 2018-12-02 | DRG: 470 | Disposition: A | Discharge status: Home / Self Care | Payer: Medicare Other | Attending: Orthopedic Surgery | Admitting: Orthopedic Surgery

## 2018-11-30 ENCOUNTER — Encounter: Payer: Self-pay | Admitting: Orthopedic Surgery

## 2018-11-30 ENCOUNTER — Inpatient Hospital Stay: Payer: Medicare Other

## 2018-11-30 ENCOUNTER — Inpatient Hospital Stay: Payer: Medicare Other | Admitting: Certified Registered Nurse Anesthetist

## 2018-11-30 ENCOUNTER — Encounter
Admission: RE | Disposition: A | Discharge status: Home / Self Care | Payer: Self-pay | Source: Home / Self Care | Attending: Orthopedic Surgery

## 2018-11-30 DIAGNOSIS — Z8673 Personal history of transient ischemic attack (TIA), and cerebral infarction without residual deficits: Secondary | ICD-10-CM

## 2018-11-30 DIAGNOSIS — Z823 Family history of stroke: Secondary | ICD-10-CM | POA: Diagnosis not present

## 2018-11-30 DIAGNOSIS — Z8249 Family history of ischemic heart disease and other diseases of the circulatory system: Secondary | ICD-10-CM

## 2018-11-30 DIAGNOSIS — K219 Gastro-esophageal reflux disease without esophagitis: Secondary | ICD-10-CM | POA: Diagnosis present

## 2018-11-30 DIAGNOSIS — Z471 Aftercare following joint replacement surgery: Secondary | ICD-10-CM | POA: Diagnosis not present

## 2018-11-30 DIAGNOSIS — Z888 Allergy status to other drugs, medicaments and biological substances status: Secondary | ICD-10-CM

## 2018-11-30 DIAGNOSIS — Z7982 Long term (current) use of aspirin: Secondary | ICD-10-CM | POA: Diagnosis not present

## 2018-11-30 DIAGNOSIS — Z7902 Long term (current) use of antithrombotics/antiplatelets: Secondary | ICD-10-CM

## 2018-11-30 DIAGNOSIS — Z87891 Personal history of nicotine dependence: Secondary | ICD-10-CM

## 2018-11-30 DIAGNOSIS — Z8042 Family history of malignant neoplasm of prostate: Secondary | ICD-10-CM | POA: Diagnosis not present

## 2018-11-30 DIAGNOSIS — Z7951 Long term (current) use of inhaled steroids: Secondary | ICD-10-CM

## 2018-11-30 DIAGNOSIS — Z79899 Other long term (current) drug therapy: Secondary | ICD-10-CM

## 2018-11-30 DIAGNOSIS — I10 Essential (primary) hypertension: Secondary | ICD-10-CM | POA: Diagnosis present

## 2018-11-30 DIAGNOSIS — Z79891 Long term (current) use of opiate analgesic: Secondary | ICD-10-CM

## 2018-11-30 DIAGNOSIS — E119 Type 2 diabetes mellitus without complications: Secondary | ICD-10-CM | POA: Diagnosis present

## 2018-11-30 DIAGNOSIS — Z96659 Presence of unspecified artificial knee joint: Secondary | ICD-10-CM

## 2018-11-30 DIAGNOSIS — G4733 Obstructive sleep apnea (adult) (pediatric): Secondary | ICD-10-CM | POA: Diagnosis not present

## 2018-11-30 DIAGNOSIS — Z820 Family history of epilepsy and other diseases of the nervous system: Secondary | ICD-10-CM | POA: Diagnosis not present

## 2018-11-30 DIAGNOSIS — Z974 Presence of external hearing-aid: Secondary | ICD-10-CM

## 2018-11-30 DIAGNOSIS — Z96651 Presence of right artificial knee joint: Secondary | ICD-10-CM | POA: Diagnosis not present

## 2018-11-30 DIAGNOSIS — M1711 Unilateral primary osteoarthritis, right knee: Secondary | ICD-10-CM | POA: Diagnosis present

## 2018-11-30 DIAGNOSIS — G4730 Sleep apnea, unspecified: Secondary | ICD-10-CM | POA: Diagnosis present

## 2018-11-30 HISTORY — PX: KNEE ARTHROPLASTY: SHX992

## 2018-11-30 LAB — TYPE AND SCREEN
ABO/RH(D): A POS
ANTIBODY SCREEN: NEGATIVE

## 2018-11-30 SURGERY — ARTHROPLASTY, KNEE, TOTAL, USING IMAGELESS COMPUTER-ASSISTED NAVIGATION
Anesthesia: Spinal | Site: Knee | Laterality: Right

## 2018-11-30 MED ORDER — CLINDAMYCIN PHOSPHATE 900 MG/50ML IV SOLN
INTRAVENOUS | Status: AC
  Filled 2018-11-30: qty 50

## 2018-11-30 MED ORDER — PROPOFOL 500 MG/50ML IV EMUL
INTRAVENOUS | Status: DC | PRN
  Administered 2018-11-30: 70 ug/kg/min via INTRAVENOUS

## 2018-11-30 MED ORDER — TETRACAINE HCL 1 % IJ SOLN
INTRAMUSCULAR | Status: DC | PRN
  Administered 2018-11-30: 4 mg via INTRASPINAL

## 2018-11-30 MED ORDER — OXYCODONE HCL 5 MG PO TABS
10.0000 mg | ORAL_TABLET | ORAL | Status: DC | PRN
  Administered 2018-12-01 – 2018-12-02 (×2): 10 mg via ORAL
  Filled 2018-11-30 (×2): qty 2

## 2018-11-30 MED ORDER — HYDROMORPHONE HCL 1 MG/ML IJ SOLN: 0.5000 mg | INTRAMUSCULAR | Status: DC | PRN

## 2018-11-30 MED ORDER — PANTOPRAZOLE SODIUM 40 MG PO TBEC
40.0000 mg | DELAYED_RELEASE_TABLET | Freq: Two times a day (BID) | ORAL | Status: DC
  Administered 2018-11-30 – 2018-12-02 (×4): 40 mg via ORAL
  Filled 2018-11-30 (×5): qty 1

## 2018-11-30 MED ORDER — GLYCOPYRROLATE 0.2 MG/ML IJ SOLN
INTRAMUSCULAR | Status: DC | PRN
  Administered 2018-11-30: 0.2 mg via INTRAVENOUS

## 2018-11-30 MED ORDER — GENTAMICIN SULFATE 40 MG/ML IJ SOLN
INTRAMUSCULAR | Status: AC
  Filled 2018-11-30: qty 8

## 2018-11-30 MED ORDER — ACETAMINOPHEN 325 MG PO TABS: 325.0000 mg | ORAL_TABLET | Freq: Four times a day (QID) | ORAL | Status: DC | PRN

## 2018-11-30 MED ORDER — FENTANYL CITRATE (PF) 100 MCG/2ML IJ SOLN
INTRAMUSCULAR | Status: DC | PRN
  Administered 2018-11-30: 100 ug via INTRAVENOUS

## 2018-11-30 MED ORDER — CELECOXIB 200 MG PO CAPS
400.0000 mg | ORAL_CAPSULE | Freq: Once | ORAL | Status: AC
  Administered 2018-11-30: 400 mg via ORAL

## 2018-11-30 MED ORDER — LIDOCAINE HCL (CARDIAC) PF 100 MG/5ML IV SOSY
PREFILLED_SYRINGE | INTRAVENOUS | Status: DC | PRN
  Administered 2018-11-30: 100 mg via INTRAVENOUS

## 2018-11-30 MED ORDER — PROPOFOL 500 MG/50ML IV EMUL
INTRAVENOUS | Status: AC
  Filled 2018-11-30: qty 50

## 2018-11-30 MED ORDER — RISAQUAD PO CAPS
1.0000 | ORAL_CAPSULE | Freq: Every day | ORAL | Status: DC
  Administered 2018-11-30 – 2018-12-02 (×3): 1 via ORAL
  Filled 2018-11-30 (×3): qty 1

## 2018-11-30 MED ORDER — FLUTICASONE PROPIONATE 50 MCG/ACT NA SUSP
2.0000 | Freq: Every day | NASAL | Status: DC | PRN
  Filled 2018-11-30: qty 16

## 2018-11-30 MED ORDER — ACETAMINOPHEN 10 MG/ML IV SOLN
INTRAVENOUS | Status: AC
  Filled 2018-11-30: qty 100

## 2018-11-30 MED ORDER — CELECOXIB 200 MG PO CAPS
200.0000 mg | ORAL_CAPSULE | Freq: Two times a day (BID) | ORAL | Status: DC
  Administered 2018-11-30 – 2018-12-02 (×4): 200 mg via ORAL
  Filled 2018-11-30 (×4): qty 1

## 2018-11-30 MED ORDER — DIPHENHYDRAMINE HCL 12.5 MG/5ML PO ELIX: 12.5000 mg | ORAL_SOLUTION | ORAL | Status: DC | PRN

## 2018-11-30 MED ORDER — PROPOFOL 10 MG/ML IV BOLUS
INTRAVENOUS | Status: AC
  Filled 2018-11-30: qty 20

## 2018-11-30 MED ORDER — ATORVASTATIN CALCIUM 20 MG PO TABS
80.0000 mg | ORAL_TABLET | Freq: Every day | ORAL | Status: DC
  Administered 2018-11-30 – 2018-12-01 (×2): 80 mg via ORAL
  Filled 2018-11-30 (×2): qty 4

## 2018-11-30 MED ORDER — FERROUS SULFATE 325 (65 FE) MG PO TABS
325.0000 mg | ORAL_TABLET | Freq: Two times a day (BID) | ORAL | Status: DC
  Administered 2018-11-30 – 2018-12-02 (×4): 325 mg via ORAL
  Filled 2018-11-30 (×4): qty 1

## 2018-11-30 MED ORDER — METOCLOPRAMIDE HCL 5 MG/ML IJ SOLN: 5.0000 mg | Freq: Three times a day (TID) | INTRAMUSCULAR | Status: DC | PRN

## 2018-11-30 MED ORDER — BUPIVACAINE HCL (PF) 0.5 % IJ SOLN
INTRAMUSCULAR | Status: AC
  Filled 2018-11-30: qty 10

## 2018-11-30 MED ORDER — TETRACAINE HCL 1 % IJ SOLN
INTRAMUSCULAR | Status: AC
  Filled 2018-11-30: qty 2

## 2018-11-30 MED ORDER — MULTIVITAMINS PO CAPS: 1.0000 | ORAL_CAPSULE | Freq: Every day | ORAL | Status: DC

## 2018-11-30 MED ORDER — TRANEXAMIC ACID-NACL 1000-0.7 MG/100ML-% IV SOLN
1000.0000 mg | INTRAVENOUS | Status: AC
  Administered 2018-11-30: 1000 mg via INTRAVENOUS
  Filled 2018-11-30: qty 100

## 2018-11-30 MED ORDER — LOSARTAN POTASSIUM 25 MG PO TABS
25.0000 mg | ORAL_TABLET | Freq: Every day | ORAL | Status: DC
  Administered 2018-12-01: 25 mg via ORAL
  Filled 2018-11-30 (×2): qty 1

## 2018-11-30 MED ORDER — PROMETHAZINE HCL 25 MG/ML IJ SOLN: 6.2500 mg | INTRAMUSCULAR | Status: DC | PRN

## 2018-11-30 MED ORDER — CLINDAMYCIN PHOSPHATE 900 MG/50ML IV SOLN
900.0000 mg | INTRAVENOUS | Status: AC
  Administered 2018-11-30: 900 mg via INTRAVENOUS

## 2018-11-30 MED ORDER — PROPOFOL 10 MG/ML IV BOLUS
INTRAVENOUS | Status: DC | PRN
  Administered 2018-11-30 (×3): 27 mg via INTRAVENOUS

## 2018-11-30 MED ORDER — MENTHOL 3 MG MT LOZG
1.0000 | LOZENGE | OROMUCOSAL | Status: DC | PRN
  Filled 2018-11-30: qty 9

## 2018-11-30 MED ORDER — GABAPENTIN 300 MG PO CAPS
300.0000 mg | ORAL_CAPSULE | Freq: Every day | ORAL | Status: DC
  Administered 2018-11-30 – 2018-12-01 (×2): 300 mg via ORAL
  Filled 2018-11-30 (×2): qty 1

## 2018-11-30 MED ORDER — BUPIVACAINE LIPOSOME 1.3 % IJ SUSP
INTRAMUSCULAR | Status: AC
  Filled 2018-11-30: qty 20

## 2018-11-30 MED ORDER — PHENOL 1.4 % MT LIQD
1.0000 | OROMUCOSAL | Status: DC | PRN
  Filled 2018-11-30: qty 177

## 2018-11-30 MED ORDER — PHENYLEPHRINE HCL 10 MG/ML IJ SOLN
INTRAMUSCULAR | Status: AC
  Filled 2018-11-30: qty 1

## 2018-11-30 MED ORDER — CELECOXIB 200 MG PO CAPS
ORAL_CAPSULE | ORAL | Status: AC
  Filled 2018-11-30: qty 2

## 2018-11-30 MED ORDER — SENNOSIDES-DOCUSATE SODIUM 8.6-50 MG PO TABS
1.0000 | ORAL_TABLET | Freq: Two times a day (BID) | ORAL | Status: DC
  Administered 2018-11-30 – 2018-12-02 (×5): 1 via ORAL
  Filled 2018-11-30 (×5): qty 1

## 2018-11-30 MED ORDER — OXYCODONE HCL 5 MG PO TABS: 5.0000 mg | ORAL_TABLET | Freq: Once | ORAL | Status: DC | PRN

## 2018-11-30 MED ORDER — CLINDAMYCIN PHOSPHATE 600 MG/50ML IV SOLN
600.0000 mg | Freq: Four times a day (QID) | INTRAVENOUS | Status: AC
  Administered 2018-11-30 – 2018-12-01 (×4): 600 mg via INTRAVENOUS
  Filled 2018-11-30 (×5): qty 50

## 2018-11-30 MED ORDER — ONDANSETRON HCL 4 MG PO TABS: 4.0000 mg | ORAL_TABLET | Freq: Four times a day (QID) | ORAL | Status: DC | PRN

## 2018-11-30 MED ORDER — MAGNESIUM HYDROXIDE 400 MG/5ML PO SUSP
30.0000 mL | Freq: Every day | ORAL | Status: DC
  Administered 2018-11-30 – 2018-12-02 (×3): 30 mL via ORAL
  Filled 2018-11-30 (×2): qty 30

## 2018-11-30 MED ORDER — METOCLOPRAMIDE HCL 10 MG PO TABS
10.0000 mg | ORAL_TABLET | Freq: Three times a day (TID) | ORAL | Status: DC
  Administered 2018-11-30 – 2018-12-02 (×6): 10 mg via ORAL
  Filled 2018-11-30 (×6): qty 1

## 2018-11-30 MED ORDER — BUPIVACAINE HCL (PF) 0.5 % IJ SOLN
INTRAMUSCULAR | Status: DC | PRN
  Administered 2018-11-30: 2.6 mL

## 2018-11-30 MED ORDER — ACETAMINOPHEN 10 MG/ML IV SOLN
INTRAVENOUS | Status: DC | PRN
  Administered 2018-11-30: 1000 mg via INTRAVENOUS

## 2018-11-30 MED ORDER — GABAPENTIN 300 MG PO CAPS
300.0000 mg | ORAL_CAPSULE | Freq: Once | ORAL | Status: AC
  Administered 2018-11-30: 300 mg via ORAL

## 2018-11-30 MED ORDER — FLEET ENEMA 7-19 GM/118ML RE ENEM: 1.0000 | ENEMA | Freq: Once | RECTAL | Status: DC | PRN

## 2018-11-30 MED ORDER — CHLORHEXIDINE GLUCONATE 4 % EX LIQD: 60.0000 mL | Freq: Once | CUTANEOUS | Status: DC

## 2018-11-30 MED ORDER — DEXAMETHASONE SODIUM PHOSPHATE 10 MG/ML IJ SOLN
INTRAMUSCULAR | Status: AC
  Filled 2018-11-30: qty 1

## 2018-11-30 MED ORDER — LIDOCAINE HCL (PF) 2 % IJ SOLN
INTRAMUSCULAR | Status: AC
  Filled 2018-11-30: qty 10

## 2018-11-30 MED ORDER — BUPIVACAINE HCL (PF) 0.25 % IJ SOLN
INTRAMUSCULAR | Status: DC | PRN
  Administered 2018-11-30: 60 mL

## 2018-11-30 MED ORDER — BISACODYL 10 MG RE SUPP: 10.0000 mg | Freq: Every day | RECTAL | Status: DC | PRN

## 2018-11-30 MED ORDER — ADULT MULTIVITAMIN W/MINERALS CH
1.0000 | ORAL_TABLET | Freq: Every day | ORAL | Status: DC
  Administered 2018-11-30 – 2018-12-02 (×3): 1 via ORAL
  Filled 2018-11-30 (×3): qty 1

## 2018-11-30 MED ORDER — ALUM & MAG HYDROXIDE-SIMETH 200-200-20 MG/5ML PO SUSP
30.0000 mL | ORAL | Status: DC | PRN
  Administered 2018-11-30: 30 mL via ORAL
  Filled 2018-11-30: qty 30

## 2018-11-30 MED ORDER — GENTAMICIN SULFATE 0.1 % EX OINT
1.0000 "application " | TOPICAL_OINTMENT | Freq: Two times a day (BID) | CUTANEOUS | Status: DC
  Administered 2018-11-30 – 2018-12-02 (×4): 1 via TOPICAL
  Filled 2018-11-30: qty 15

## 2018-11-30 MED ORDER — BUPIVACAINE HCL (PF) 0.25 % IJ SOLN
INTRAMUSCULAR | Status: AC
  Filled 2018-11-30: qty 60

## 2018-11-30 MED ORDER — FENTANYL CITRATE (PF) 100 MCG/2ML IJ SOLN
INTRAMUSCULAR | Status: AC
  Filled 2018-11-30: qty 2

## 2018-11-30 MED ORDER — PROBIOTIC DAILY PO CAPS: 1.0000 | ORAL_CAPSULE | Freq: Every day | ORAL | Status: DC

## 2018-11-30 MED ORDER — SODIUM CHLORIDE 0.9 % IV SOLN
INTRAVENOUS | Status: DC | PRN
  Administered 2018-11-30: 60 mL

## 2018-11-30 MED ORDER — MIDAZOLAM HCL 5 MG/5ML IJ SOLN
INTRAMUSCULAR | Status: DC | PRN
  Administered 2018-11-30: 1 mg via INTRAVENOUS

## 2018-11-30 MED ORDER — ONDANSETRON HCL 4 MG/2ML IJ SOLN
4.0000 mg | Freq: Four times a day (QID) | INTRAMUSCULAR | Status: DC | PRN
  Administered 2018-11-30: 4 mg via INTRAVENOUS
  Filled 2018-11-30: qty 2

## 2018-11-30 MED ORDER — TRAMADOL HCL 50 MG PO TABS
50.0000 mg | ORAL_TABLET | ORAL | Status: DC | PRN
  Administered 2018-11-30 (×2): 100 mg via ORAL
  Filled 2018-11-30 (×2): qty 2

## 2018-11-30 MED ORDER — OXYCODONE HCL 5 MG PO TABS
5.0000 mg | ORAL_TABLET | ORAL | Status: DC | PRN
  Administered 2018-11-30 – 2018-12-01 (×3): 5 mg via ORAL
  Filled 2018-11-30 (×3): qty 1

## 2018-11-30 MED ORDER — GABAPENTIN 300 MG PO CAPS
ORAL_CAPSULE | ORAL | Status: AC
  Filled 2018-11-30: qty 1

## 2018-11-30 MED ORDER — SODIUM CHLORIDE FLUSH 0.9 % IV SOLN
INTRAVENOUS | Status: AC
  Filled 2018-11-30: qty 40

## 2018-11-30 MED ORDER — METOCLOPRAMIDE HCL 10 MG PO TABS: 5.0000 mg | ORAL_TABLET | Freq: Three times a day (TID) | ORAL | Status: DC | PRN

## 2018-11-30 MED ORDER — ACETAMINOPHEN 10 MG/ML IV SOLN
1000.0000 mg | Freq: Four times a day (QID) | INTRAVENOUS | Status: AC
  Administered 2018-11-30 – 2018-12-01 (×4): 1000 mg via INTRAVENOUS
  Filled 2018-11-30 (×4): qty 100

## 2018-11-30 MED ORDER — SODIUM CHLORIDE 0.9 % IV SOLN
INTRAVENOUS | Status: DC | PRN
  Administered 2018-11-30: 30 ug/min via INTRAVENOUS

## 2018-11-30 MED ORDER — DEXAMETHASONE SODIUM PHOSPHATE 10 MG/ML IJ SOLN
8.0000 mg | Freq: Once | INTRAMUSCULAR | Status: AC
  Administered 2018-11-30: 8 mg via INTRAVENOUS

## 2018-11-30 MED ORDER — SODIUM CHLORIDE 0.9 % IV SOLN
INTRAVENOUS | Status: DC | PRN
  Administered 2018-11-30: 08:00:00

## 2018-11-30 MED ORDER — FENTANYL CITRATE (PF) 100 MCG/2ML IJ SOLN: 25.0000 ug | INTRAMUSCULAR | Status: DC | PRN

## 2018-11-30 MED ORDER — MIDAZOLAM HCL 2 MG/2ML IJ SOLN
INTRAMUSCULAR | Status: AC
  Filled 2018-11-30: qty 2

## 2018-11-30 MED ORDER — MEPERIDINE HCL 50 MG/ML IJ SOLN: 6.2500 mg | INTRAMUSCULAR | Status: DC | PRN

## 2018-11-30 MED ORDER — SODIUM CHLORIDE 0.9 % IV SOLN
INTRAVENOUS | Status: DC
  Administered 2018-11-30 (×2): via INTRAVENOUS

## 2018-11-30 MED ORDER — LACTATED RINGERS IV SOLN
INTRAVENOUS | Status: DC
  Administered 2018-11-30 (×2): via INTRAVENOUS

## 2018-11-30 MED ORDER — OXYCODONE HCL 5 MG/5ML PO SOLN: 5.0000 mg | Freq: Once | ORAL | Status: DC | PRN

## 2018-11-30 MED ORDER — ENOXAPARIN SODIUM 30 MG/0.3ML ~~LOC~~ SOLN
30.0000 mg | Freq: Two times a day (BID) | SUBCUTANEOUS | Status: DC
  Administered 2018-12-01 – 2018-12-02 (×3): 30 mg via SUBCUTANEOUS
  Filled 2018-11-30 (×3): qty 0.3

## 2018-11-30 MED ORDER — TRANEXAMIC ACID-NACL 1000-0.7 MG/100ML-% IV SOLN
1000.0000 mg | Freq: Once | INTRAVENOUS | Status: AC
  Administered 2018-11-30: 1000 mg via INTRAVENOUS
  Filled 2018-11-30: qty 100

## 2018-11-30 MED ORDER — GLYCOPYRROLATE 0.2 MG/ML IJ SOLN
INTRAMUSCULAR | Status: AC
  Filled 2018-11-30: qty 1

## 2018-11-30 SURGICAL SUPPLY — 71 items
ATTUNE MED DOME PAT 41 KNEE (Knees) ×1 IMPLANT
ATTUNE PS FEM RT SZ 7 CEM KNEE (Femur) ×1 IMPLANT
ATTUNE PSRP INSR SZ7 5 KNEE (Insert) ×1 IMPLANT
BASE TIBIA ATTUNE KNEE SYS SZ6 (Knees) IMPLANT
BATTERY INSTRU NAVIGATION (MISCELLANEOUS) ×8 IMPLANT
BLADE SAW 70X12.5 (BLADE) ×2 IMPLANT
BLADE SAW 90X13X1.19 OSCILLAT (BLADE) ×2 IMPLANT
BLADE SAW 90X25X1.19 OSCILLAT (BLADE) ×2 IMPLANT
BONE CEMENT GENTAMICIN (Cement) ×4 IMPLANT
CANISTER SUCT 1200ML W/VALVE (MISCELLANEOUS) ×2 IMPLANT
CANISTER SUCT 3000ML PPV (MISCELLANEOUS) ×4 IMPLANT
CEMENT BONE GENTAMICIN 40 (Cement) IMPLANT
COOLER POLAR GLACIER W/PUMP (MISCELLANEOUS) ×2 IMPLANT
COVER WAND RF STERILE (DRAPES) ×1 IMPLANT
CUFF TOURN 24 STER (MISCELLANEOUS) IMPLANT
CUFF TOURN 30 STER DUAL PORT (MISCELLANEOUS) ×1 IMPLANT
DRAPE SHEET LG 3/4 BI-LAMINATE (DRAPES) ×2 IMPLANT
DRSG DERMACEA 8X12 NADH (GAUZE/BANDAGES/DRESSINGS) ×2 IMPLANT
DRSG OPSITE POSTOP 4X14 (GAUZE/BANDAGES/DRESSINGS) ×2 IMPLANT
DRSG TEGADERM 4X4.75 (GAUZE/BANDAGES/DRESSINGS) ×2 IMPLANT
DURAPREP 26ML APPLICATOR (WOUND CARE) ×4 IMPLANT
ELECT CAUTERY BLADE 6.4 (BLADE) ×2 IMPLANT
ELECT REM PT RETURN 9FT ADLT (ELECTROSURGICAL) ×2
ELECTRODE REM PT RTRN 9FT ADLT (ELECTROSURGICAL) ×1 IMPLANT
EX-PIN ORTHOLOCK NAV 4X150 (PIN) ×4 IMPLANT
GLOVE BIOGEL M STRL SZ7.5 (GLOVE) ×8 IMPLANT
GLOVE BIOGEL PI IND STRL 9 (GLOVE) ×1 IMPLANT
GLOVE BIOGEL PI INDICATOR 9 (GLOVE) ×1
GLOVE INDICATOR 8.0 STRL GRN (GLOVE) ×2 IMPLANT
GLOVE SURG SYN 9.0  PF PI (GLOVE) ×1
GLOVE SURG SYN 9.0 PF PI (GLOVE) ×1 IMPLANT
GOWN STRL REUS W/ TWL LRG LVL3 (GOWN DISPOSABLE) ×2 IMPLANT
GOWN STRL REUS W/TWL 2XL LVL3 (GOWN DISPOSABLE) ×3 IMPLANT
GOWN STRL REUS W/TWL LRG LVL3 (GOWN DISPOSABLE) ×2
HEMOVAC 400CC 10FR (MISCELLANEOUS) ×2 IMPLANT
HOLDER FOLEY CATH W/STRAP (MISCELLANEOUS) ×2 IMPLANT
HOOD PEEL AWAY FLYTE STAYCOOL (MISCELLANEOUS) ×5 IMPLANT
KIT TURNOVER KIT A (KITS) ×2 IMPLANT
KNIFE SCULPS 14X20 (INSTRUMENTS) ×2 IMPLANT
LABEL OR SOLS (LABEL) ×2 IMPLANT
NDL SAFETY ECLIPSE 18X1.5 (NEEDLE) ×1 IMPLANT
NDL SPNL 20GX3.5 QUINCKE YW (NEEDLE) ×2 IMPLANT
NEEDLE HYPO 18GX1.5 SHARP (NEEDLE) ×1
NEEDLE SPNL 20GX3.5 QUINCKE YW (NEEDLE) ×4 IMPLANT
NS IRRIG 500ML POUR BTL (IV SOLUTION) ×2 IMPLANT
PACK TOTAL KNEE (MISCELLANEOUS) ×2 IMPLANT
PAD WRAPON POLAR KNEE (MISCELLANEOUS) ×1 IMPLANT
PENCIL SMOKE ULTRAEVAC 22 CON (MISCELLANEOUS) ×2 IMPLANT
PIN DRILL QUICK PACK ×2 IMPLANT
PIN FIXATION 1/8DIA X 3INL (PIN) ×6 IMPLANT
PULSAVAC PLUS IRRIG FAN TIP (DISPOSABLE) ×2
SOL .9 NS 3000ML IRR  AL (IV SOLUTION) ×1
SOL .9 NS 3000ML IRR UROMATIC (IV SOLUTION) ×1 IMPLANT
SOL PREP PVP 2OZ (MISCELLANEOUS) ×2
SOLUTION PREP PVP 2OZ (MISCELLANEOUS) ×1 IMPLANT
SPONGE DRAIN TRACH 4X4 STRL 2S (GAUZE/BANDAGES/DRESSINGS) ×2 IMPLANT
STAPLER SKIN PROX 35W (STAPLE) ×2 IMPLANT
STRAP TIBIA SHORT (MISCELLANEOUS) ×2 IMPLANT
SUCTION FRAZIER HANDLE 10FR (MISCELLANEOUS) ×1
SUCTION TUBE FRAZIER 10FR DISP (MISCELLANEOUS) ×1 IMPLANT
SUT VIC AB 0 CT1 36 (SUTURE) ×2 IMPLANT
SUT VIC AB 1 CT1 36 (SUTURE) ×4 IMPLANT
SUT VIC AB 2-0 CT2 27 (SUTURE) ×2 IMPLANT
SYR 20CC LL (SYRINGE) ×2 IMPLANT
SYR 30ML LL (SYRINGE) ×4 IMPLANT
TIBIA ATTUNE KNEE SYS BASE SZ6 (Knees) ×2 IMPLANT
TIP FAN IRRIG PULSAVAC PLUS (DISPOSABLE) ×1 IMPLANT
TOWEL OR 17X26 4PK STRL BLUE (TOWEL DISPOSABLE) ×2 IMPLANT
TOWER CARTRIDGE SMART MIX (DISPOSABLE) ×2 IMPLANT
TRAY FOLEY MTR SLVR 16FR STAT (SET/KITS/TRAYS/PACK) ×2 IMPLANT
WRAPON POLAR PAD KNEE (MISCELLANEOUS) ×2

## 2018-11-30 NOTE — Progress Notes (Signed)
Pt admitted to unit from PACU. Pt is A&Ox4, VSS. Surgical dressing CDI to right knee, polar care in place, bone foam applied to RLE. Hemovac to suction, foley draining. Pt can flex right thigh and bend left knee. Pt oriented to room and plan of care, family at bedside. Bed in lowest position, call bell within reach.

## 2018-11-30 NOTE — Anesthesia Procedure Notes (Signed)
Spinal  Patient location during procedure: OR Start time: 11/30/2018 7:25 AM End time: 11/30/2018 7:38 AM Staffing Anesthesiologist: Emmie Niemann, MD Resident/CRNA: Bernardo Heater, CRNA Performed: resident/CRNA and anesthesiologist  Preanesthetic Checklist Completed: patient identified, site marked, surgical consent, pre-op evaluation, timeout performed, IV checked, risks and benefits discussed and monitors and equipment checked Spinal Block Patient position: sitting Prep: ChloraPrep Patient monitoring: heart rate, continuous pulse ox, blood pressure and cardiac monitor Approach: midline Location: L4-5 Injection technique: single-shot Needle Needle type: Introducer and Pencil-Tip  Needle gauge: 24 G Needle length: 9 cm Additional Notes Negative paresthesia. Negative blood return. Positive free-flowing CSF. Expiration date of kit checked and confirmed. Patient tolerated procedure well, without complications.

## 2018-11-30 NOTE — Op Note (Addendum)
OPERATIVE NOTE  DATE OF SURGERY:  11/30/2018  PATIENT NAME:  James Compton   DOB: 1951-01-02  MRN: [id number]  PRE-OPERATIVE DIAGNOSIS: Degenerative arthrosis of the right knee, primary  POST-OPERATIVE DIAGNOSIS:  Same  PROCEDURE:  Right total knee arthroplasty using computer-assisted navigation  SURGEON:  Marciano Sequin. M.D.  ASSISTANT:  Liliane Bade, PA-C (present and scrubbed throughout the case, critical for assistance with exposure, retraction, instrumentation, and closure)  ANESTHESIA: spinal  ESTIMATED BLOOD LOSS: 50 mL  FLUIDS REPLACED: 1300 mL of crystalloid  TOURNIQUET TIME: 86 minutes  DRAINS: 2 medium Hemovac drains  SOFT TISSUE RELEASES: Anterior cruciate ligament, posterior cruciate ligament, deep medial collateral ligament, patellofemoral ligament  IMPLANTS UTILIZED: DePuy Attune size 7 posterior stabilized femoral component (cemented), size 6 rotating platform tibial component (cemented), 41 mm medialized dome patella (cemented), and a 5 mm stabilized rotating platform polyethylene insert.  INDICATIONS FOR SURGERY: James Compton is a 68 y.o. year old male with a long history of progressive knee pain. X-rays demonstrated severe degenerative changes in tricompartmental fashion. The patient had not seen any significant improvement despite conservative nonsurgical intervention. After discussion of the risks and benefits of surgical intervention, the patient expressed understanding of the risks benefits and agree with plans for total knee arthroplasty.   The risks, benefits, and alternatives were discussed at length including but not limited to the risks of infection, bleeding, nerve injury, stiffness, blood clots, the need for revision surgery, cardiopulmonary complications, among others, and they were willing to proceed.  PROCEDURE IN DETAIL: The patient was brought into the operating room and, after adequate spinal anesthesia was achieved, a tourniquet was placed on  the patient's upper thigh. The patient's knee and leg were cleaned and prepped with alcohol and DuraPrep and draped in the usual sterile fashion. A "timeout" was performed as per usual protocol. The lower extremity was exsanguinated using an Esmarch, and the tourniquet was inflated to 300 mmHg. An anterior longitudinal incision was made followed by a standard mid vastus approach. The deep fibers of the medial collateral ligament were elevated in a subperiosteal fashion off of the medial flare of the tibia so as to maintain a continuous soft tissue sleeve. The patella was subluxed laterally and the patellofemoral ligament was incised. Inspection of the knee demonstrated severe degenerative changes with full-thickness loss of articular cartilage. Osteophytes were debrided using a rongeur. Anterior and posterior cruciate ligaments were excised. Two 4.0 mm Schanz pins were inserted in the femur and into the tibia for attachment of the array of trackers used for computer-assisted navigation. Hip center was identified using a circumduction technique. Distal landmarks were mapped using the computer. The distal femur and proximal tibia were mapped using the computer. The distal femoral cutting guide was positioned using computer-assisted navigation so as to achieve a 5 distal valgus cut. The femur was sized and it was felt that a size 7 femoral component was appropriate. A size 7 femoral cutting guide was positioned and the anterior cut was performed and verified using the computer. This was followed by completion of the posterior and chamfer cuts. Femoral cutting guide for the central box was then positioned in the center box cut was performed.  Attention was then directed to the proximal tibia. Medial and lateral menisci were excised. The extramedullary tibial cutting guide was positioned using computer-assisted navigation so as to achieve a 0 varus-valgus alignment and 3 posterior slope. The cut was performed and  verified using the computer. The proximal tibia  was sized and it was felt that a size 6 tibial tray was appropriate. Tibial and femoral trials were inserted followed by insertion of a 5 mm polyethylene insert. This allowed for excellent mediolateral soft tissue balancing both in flexion and in full extension. Finally, the patella was cut and prepared so as to accommodate a 41 mm medialized dome patella. A patella trial was placed and the knee was placed through a range of motion with excellent patellar tracking appreciated. The femoral trial was removed after debridement of posterior osteophytes. The central post-hole for the tibial component was reamed followed by insertion of a keel punch. Tibial trials were then removed. Cut surfaces of bone were irrigated with copious amounts of normal saline with antibiotic solution using pulsatile lavage and then suctioned dry. Polymethylmethacrylate cement with gentamicin was prepared in the usual fashion using a vacuum mixer. Cement was applied to the cut surface of the proximal tibia as well as along the undersurface of a size 6 rotating platform tibial component. Tibial component was positioned and impacted into place. Excess cement was removed using [employer] fast streamer. Cement was then applied to the cut surfaces of the femur as well as along the posterior flanges of the size 7 femoral component. The femoral component was positioned and impacted into place. Excess cement was removed using [employer] fast streamer. A 5 mm polyethylene trial was inserted and the knee was brought into full extension with steady axial compression applied. Finally, cement was applied to the backside of a 41 mm medialized dome patella and the patellar component was positioned and patellar clamp applied. Excess cement was removed using [employer] fast streamer. After adequate curing of the cement, the tourniquet was deflated after a total tourniquet time of 86 minutes. Hemostasis was achieved using electrocautery.  The knee was irrigated with copious amounts of normal saline with antibiotic solution using pulsatile lavage and then suctioned dry. 20 mL of 1.3% Exparel and 60 mL of 0.25% Marcaine in 40 mL of normal saline was injected along the posterior capsule, medial and lateral gutters, and along the arthrotomy site. A 5 mm stabilized rotating platform polyethylene insert was inserted and the knee was placed through a range of motion with excellent mediolateral soft tissue balancing appreciated and excellent patellar tracking noted. 2 medium drains were placed in the wound bed and brought out through separate stab incisions. The medial parapatellar portion of the incision was reapproximated using interrupted sutures of #1 Vicryl. Subcutaneous tissue was approximated in layers using first #0 Vicryl followed #2-0 Vicryl. The skin was approximated with skin staples. A sterile dressing was applied.  The patient tolerated the procedure well and was transported to the recovery room in stable condition.    Jordynn Marcella P. Holley Bouche., M.D.

## 2018-11-30 NOTE — Care Management Note (Signed)
Case Management Note  Patient Details  Name: James Compton MRN: [id number] Date of Birth: 28-Nov-1950  Subjective/Objective:                   Met with Patient and family to discuss discharge Patient states that he will do Outpatient PT He does not have a walker and will need one, Alerted [facility] of the need Patient uses [facility] [address], can afford medications Patient has transportation including his wife and many others Patient sees Dr. Lelon Huh as PCP Patient is a pastor of a church Patient states he has no needs   Action/Plan: [facility] choice list provided per CMS.gov  Expected Discharge Date:                  Expected Discharge Plan:  Home/Self Care  In-House Referral:     Discharge planning Services  CM Consult  Post Acute Care Choice:  Durable Medical Equipment Choice offered to:  Patient  DME Arranged:  Walker rolling DME Agency:  [place]:    [place] Agency:     Status of Service:  In process, will continue to follow  If discussed at Long Length of Stay Meetings, dates discussed:    Additional Comments:  Su Hilt, RN 11/30/2018, 2:41 PM

## 2018-11-30 NOTE — Transfer of Care (Signed)
Immediate Anesthesia Transfer of Care Note  Patient: James Compton  Procedure(s) Performed: COMPUTER ASSISTED TOTAL KNEE ARTHROPLASTY (Right Knee)  Patient Location: PACU  Anesthesia Type:Spinal  Level of Consciousness: awake and patient cooperative  Airway & Oxygen Therapy: Patient Spontanous Breathing and Patient connected to nasal cannula oxygen  Post-op Assessment: Report given to RN and Post -op Vital signs reviewed and stable  Post vital signs: Reviewed and stable  Last Vitals:  Vitals Value Taken Time  BP    Temp    Pulse 101 11/30/2018 10:47 AM  Resp    SpO2 95 % 11/30/2018 10:47 AM  Vitals shown include unvalidated device data.  Last Pain:  Vitals:   11/30/18 0613  TempSrc: Oral  PainSc: 1          Complications: No apparent anesthesia complications

## 2018-11-30 NOTE — H&P (Signed)
The patient has been re-examined, and the chart reviewed, and there have been no interval changes to the documented history and physical.    The risks, benefits, and alternatives have been discussed at length. The patient expressed understanding of the risks benefits and agreed with plans for surgical intervention.  James Compton, Jr. M.D.    

## 2018-11-30 NOTE — Progress Notes (Signed)
PT Cancellation Note  Patient Details Name: James Compton MRN: [id number] DOB: 17-Jun-1951   Cancelled Treatment:    Reason Eval/Treat Not Completed: Other (comment).  Residual numbness RLE following spinal.  Will attempt to see next date, 1/7.    Collie Siad PT, DPT 11/30/2018, 3:14 PM

## 2018-11-30 NOTE — Anesthesia Post-op Follow-up Note (Signed)
Anesthesia QCDR form completed.        

## 2018-11-30 NOTE — NC FL2 (Signed)
[place] LEVEL OF CARE SCREENING TOOL     IDENTIFICATION  Patient Name: James Compton Birthdate: 1951-10-13 Sex: male Admission Date (Current Location): 11/30/2018  [place] and [place] Number:  Engineering geologist and Address:  [facility], [address] [place],  [address]      Provider Number: [id number]  Attending Physician Name and Address:  Dereck Leep, MD  Relative Name and Phone Number:       Current Level of Care: Hospital Recommended Level of Care: [place] Prior Approval Number:    Date Approved/Denied:   PASRR Number: ([id number])  Discharge Plan: SNF    Current Diagnoses: Patient Active Problem List   Diagnosis Date Noted  . Total knee replacement status 11/30/2018  . Plantar fasciitis, right 07/02/2018  . Osteoarthritis of right knee 07/02/2018  . OSA (obstructive sleep apnea) 12/09/2017  . Essential hypertension 11/04/2016  . Hyperlipidemia 11/04/2016  . Obesity 11/04/2016  . TIA (transient ischemic attack) 11/03/2016  . Aortic ectasia, abdominal (HCC) 10/21/2016    Orientation RESPIRATION BLADDER Height & Weight     Self, Time, Situation, Place  O2(2 Liters Oxygen. ) Continent Weight: 206 lb 9.6 oz (93.7 kg) Height:  5\' 8"  (172.7 cm)  BEHAVIORAL SYMPTOMS/MOOD NEUROLOGICAL BOWEL NUTRITION STATUS      Continent Diet(Diet: Clear Liquid to be Advanced. )  AMBULATORY STATUS COMMUNICATION OF NEEDS Skin   Extensive Assist Verbally Surgical wounds(Incision: Right Knee. )                       Personal Care Assistance Level of Assistance  Bathing, Feeding, Dressing Bathing Assistance: Limited assistance Feeding assistance: Independent Dressing Assistance: Limited assistance     Functional Limitations Info  Sight, Hearing, Speech Sight Info: Adequate Hearing Info: Adequate Speech Info: Adequate    SPECIAL CARE FACTORS FREQUENCY  PT (By licensed PT), OT (By licensed OT)      PT Frequency: (5) OT Frequency: (5)            Contractures      Additional Factors Info  Code Status, Allergies Code Status Info: (Full Code. ) Allergies Info: (Dipyridamole, Penicillins)           Current Medications (11/30/2018):  This is the current hospital active medication list Current Facility-Administered Medications  Medication Dose Route Frequency Provider Last Rate Last Dose  . 0.9 %  sodium chloride infusion   Intravenous Continuous Hooten, Laurice Record, MD 100 mL/hr at 11/30/18 1340    . acetaminophen (OFIRMEV) IV 1,000 mg  1,000 mg Intravenous Q6H Hooten, Laurice Record, MD      . Derrill Memo ON 12/01/2018] acetaminophen (TYLENOL) tablet 325-650 mg  325-650 mg Oral Q6H PRN Hooten, Laurice Record, MD      . acidophilus (RISAQUAD) capsule 1 capsule  1 capsule Oral Daily Hooten, Laurice Record, MD      . alum & mag hydroxide-simeth (MAALOX/MYLANTA) 200-200-20 MG/5ML suspension 30 mL  30 mL Oral Q4H PRN Hooten, Laurice Record, MD      . atorvastatin (LIPITOR) tablet 80 mg  80 mg Oral q1800 Hooten, Laurice Record, MD      . bisacodyl (DULCOLAX) suppository 10 mg  10 mg Rectal Daily PRN Hooten, Laurice Record, MD      . celecoxib (CELEBREX) 200 MG capsule           . celecoxib (CELEBREX) capsule 200 mg  200 mg Oral BID Hooten, Laurice Record, MD      .  clindamycin (CLEOCIN) IVPB 600 mg  600 mg Intravenous Q6H Hooten, Laurice Record, MD 100 mL/hr at 11/30/18 1456 600 mg at 11/30/18 1456  . dexamethasone (DECADRON) 10 MG/ML injection           . diphenhydrAMINE (BENADRYL) 12.5 MG/5ML elixir 12.5-25 mg  12.5-25 mg Oral Q4H PRN Hooten, Laurice Record, MD      . Derrill Memo ON 12/01/2018] enoxaparin (LOVENOX) injection 30 mg  30 mg Subcutaneous Q12H Hooten, Laurice Record, MD      . ferrous sulfate tablet 325 mg  325 mg Oral BID WC Hooten, Laurice Record, MD      . fluticasone (FLONASE) 50 MCG/ACT nasal spray 2 spray  2 spray Each Nare Daily PRN Hooten, Laurice Record, MD      . gabapentin (NEURONTIN) 300 MG capsule           . gabapentin (NEURONTIN) capsule 300 mg  300 mg  Oral QHS Hooten, Laurice Record, MD      . gentamicin ointment (GARAMYCIN) 0.1 % 1 application  1 application Topical BID Hooten, Laurice Record, MD      . HYDROmorphone (DILAUDID) injection 0.5-1 mg  0.5-1 mg Intravenous Q4H PRN Hooten, Laurice Record, MD      . Derrill Memo ON 12/01/2018] losartan (COZAAR) tablet 25 mg  25 mg Oral Daily Hooten, Laurice Record, MD      . magnesium hydroxide (MILK OF MAGNESIA) suspension 30 mL  30 mL Oral Daily Hooten, Laurice Record, MD      . menthol-cetylpyridinium (CEPACOL) lozenge 3 mg  1 lozenge Oral PRN Hooten, Laurice Record, MD       Or  . phenol (CHLORASEPTIC) mouth spray 1 spray  1 spray Mouth/Throat PRN Hooten, Laurice Record, MD      . metoCLOPramide (REGLAN) tablet 5-10 mg  5-10 mg Oral Q8H PRN Hooten, Laurice Record, MD       Or  . metoCLOPramide (REGLAN) injection 5-10 mg  5-10 mg Intravenous Q8H PRN Hooten, Laurice Record, MD      . metoCLOPramide (REGLAN) tablet 10 mg  10 mg Oral TID AC & HS Hooten, Laurice Record, MD      . multivitamin with minerals tablet 1 tablet  1 tablet Oral Daily Hooten, Laurice Record, MD      . ondansetron (ZOFRAN) tablet 4 mg  4 mg Oral Q6H PRN Hooten, Laurice Record, MD       Or  . ondansetron (ZOFRAN) injection 4 mg  4 mg Intravenous Q6H PRN Hooten, Laurice Record, MD   4 mg at 11/30/18 1607  . oxyCODONE (Oxy IR/ROXICODONE) immediate release tablet 10 mg  10 mg Oral Q4H PRN Hooten, Laurice Record, MD      . oxyCODONE (Oxy IR/ROXICODONE) immediate release tablet 5 mg  5 mg Oral Q4H PRN Hooten, Laurice Record, MD      . pantoprazole (PROTONIX) EC tablet 40 mg  40 mg Oral BID Hooten, Laurice Record, MD      . senna-docusate (Senokot-S) tablet 1 tablet  1 tablet Oral BID Hooten, Laurice Record, MD   1 tablet at 11/30/18 1452  . sodium phosphate (FLEET) 7-19 GM/118ML enema 1 enema  1 enema Rectal Once PRN Hooten, Laurice Record, MD      . traMADol Veatrice Bourbon) tablet 50-100 mg  50-100 mg Oral Q4H PRN Dereck Leep, MD   100 mg at 11/30/18 1452     Discharge Medications: Please see discharge summary for a list of discharge medications.  Relevant  Imaging Results:  Relevant Lab Results:   Additional Information (SSN: [id number])  Edynn Gillock, Veronia Beets, LCSW

## 2018-11-30 NOTE — Anesthesia Preprocedure Evaluation (Signed)
Anesthesia Evaluation  Patient identified by MRN, date of birth, ID band Patient awake    Reviewed: Allergy & Precautions, NPO status , Patient's Chart, lab work & pertinent test results  History of Anesthesia Complications (+) PONV and history of anesthetic complications  Airway Mallampati: III  TM Distance: >3 FB Neck ROM: Full    Dental  (+) Partial Upper   Pulmonary sleep apnea and Continuous Positive Airway Pressure Ventilation , neg COPD, former smoker,    breath sounds clear to auscultation- rhonchi (-) wheezing      Cardiovascular Exercise Tolerance: Good hypertension, Pt. on medications (-) CAD, (-) Past MI, (-) Cardiac Stents and (-) CABG  Rhythm:Regular Rate:Normal - Systolic murmurs and - Diastolic murmurs    Neuro/Psych neg Seizures TIAnegative psych ROS   GI/Hepatic Neg liver ROS, GERD  ,  Endo/Other  negative endocrine ROSneg diabetes  Renal/GU negative Renal ROS     Musculoskeletal  (+) Arthritis ,   Abdominal (+) + obese,   Peds  Hematology   Anesthesia Other Findings Past Medical History: No date: Arthritis     Comment:  Osteo - knees No date: Complication of anesthesia     Comment:  bowels "feel asleep" after knee surgery No date: GERD (gastroesophageal reflux disease)     Comment:  occas No date: Hypertension No date: Pneumonia No date: PONV (postoperative nausea and vomiting) No date: Sleep apnea 04/11/2017: Status post arthroscopy     Comment:  Right Knee May 2018 Dr. Marry Guan 11/03/2016: TIA (transient ischemic attack) 01/2011: Vertigo     Comment:  1 time event, pt had drank several cups of coffee that               day. No date: Wears hearing aid     Comment:  bilateral No date: Wears partial dentures     Comment:  upper   Reproductive/Obstetrics                             Lab Results  Component Value Date   WBC 6.7 11/11/2018   HGB 15.3 11/11/2018   HCT 44.3 11/11/2018   MCV 88.8 11/11/2018   PLT 201 11/11/2018    Anesthesia Physical Anesthesia Plan  ASA: III  Anesthesia Plan: Spinal   Post-op Pain Management:    Induction:   PONV Risk Score and Plan: 2 and Propofol infusion  Airway Management Planned: Natural Airway  Additional Equipment:   Intra-op Plan:   Post-operative Plan:   Informed Consent: I have reviewed the patients History and Physical, chart, labs and discussed the procedure including the risks, benefits and alternatives for the proposed anesthesia with the patient or authorized representative who has indicated his/her understanding and acceptance.   Dental advisory given  Plan Discussed with: CRNA and Anesthesiologist  Anesthesia Plan Comments:         Anesthesia Quick Evaluation

## 2018-12-01 ENCOUNTER — Encounter: Payer: Self-pay | Admitting: Orthopedic Surgery

## 2018-12-01 MED ORDER — ENOXAPARIN SODIUM 40 MG/0.4ML ~~LOC~~ SOLN: 40.0000 mg | SUBCUTANEOUS | 0 refills | Status: DC

## 2018-12-01 MED ORDER — TRAMADOL HCL 50 MG PO TABS: 50.0000 mg | ORAL_TABLET | ORAL | 0 refills | Status: DC | PRN

## 2018-12-01 MED ORDER — OXYCODONE HCL 5 MG PO TABS: 5.0000 mg | ORAL_TABLET | ORAL | 0 refills | Status: DC | PRN

## 2018-12-01 NOTE — Progress Notes (Signed)
Physical Therapy Treatment Patient Details Name: James Compton MRN: [id number] DOB: Jan 31, 1951 Today's Date: 12/01/2018    History of Present Illness Pt is a 68 y/o M s/p R TKA.  James Compton.     PT Comments    James Compton made excellent progress this session.  He completed stair training and ambulated x2 around the nurses station.  Cues provided for improved gait mechanics.  Follow up recommendations remain appropriate.    Follow Up Recommendations  Outpatient PT     Equipment Recommendations  None recommended by PT    Recommendations for Other Services OT consult     Precautions / Restrictions Precautions Precautions: Fall;Knee Precaution Booklet Issued: Yes (comment) Precaution Comments: Instructed pt in no pillow under knee and provided pt with exercise handout Required Braces or Orthoses: Knee Immobilizer - Right Knee Immobilizer - Right: Other (comment)("if unable to perform SLR") Restrictions Weight Bearing Restrictions: Yes RLE Weight Bearing: Weight bearing as tolerated    Mobility  Bed Mobility Overal bed mobility: Modified Independent             General bed mobility comments: Slightly increased effort but no physical assist or cues needed for supine<>sit  Transfers Overall transfer level: Needs assistance Equipment used: Rolling walker (2 wheeled) Transfers: Sit to/from Stand Sit to Stand: Supervision         General transfer comment: Pt demonstrates proper technique and no instability.  Supervision for safety.   Ambulation/Gait Ambulation/Gait assistance: Supervision Gait Distance (Feet): 320 Feet Assistive device: Rolling walker (2 wheeled) Gait Pattern/deviations: Step-through pattern;Decreased weight shift to right     General Gait Details: Cues to keep R foot pointed forward as he demonstrates slight compensatory R hip ER.  Supervision for safety.    Stairs Stairs: Yes Stairs assistance: Supervision Stair Management: One  rail Left;Step to pattern;Forwards Number of Stairs: 4 General stair comments: Cues and demonstration for proper sequencing.  Pt remains steady throughout   Wheelchair Mobility    Modified Rankin (Stroke Patients Only)       Balance Overall balance assessment: Needs assistance Sitting-balance support: No upper extremity supported;Feet supported Sitting balance-Leahy Scale: Good     Standing balance support: No upper extremity supported;During functional activity Standing balance-Leahy Scale: Fair Standing balance comment: Pt able to stand statically without UE support but utilizes RW to ambulate.                             Cognition Arousal/Alertness: Awake/alert Behavior During Therapy: WFL for tasks assessed/performed Overall Cognitive Status: Within Functional Limits for tasks assessed                                        Exercises Total Joint Exercises Ankle Circles/Pumps: AROM;Both;10 reps;Supine Quad Sets: Strengthening;Both;10 reps;Supine Heel Slides: Strengthening;Right;10 reps;Supine Straight Leg Raises: Strengthening;10 reps;Supine;Both Long Arc Quad: Strengthening;Right;10 reps;Seated Knee Flexion: AAROM;Right;5 reps;Seated;Other (comment)(with 5 second holds) Goniometric ROM: -8 to 95 deg Other Exercises Other Exercises: pt/spouse/father educated in self care skills, home/routines modifications, falls prevention strategies, polar care mgt, compression stocking mgt, and AE/DME for ADL - all verbalized understanding    General Comments General comments (skin integrity, edema, etc.): Pt noting mildly lightheaded seated in recliner. BP 118/69. SCD's applied and BLE elevated in recliner at end of session, BP 137/74 and pt reporting lightheadedness resolved  Pertinent Vitals/Pain Pain Assessment: 0-10 Pain Score: 2  Pain Location: R knee Pain Descriptors / Indicators: Discomfort Pain Intervention(s): Limited activity within  patient's tolerance;Monitored during session;Ice applied    Home Living Family/patient expects to be discharged to:: Private residence Living Arrangements: Spouse/significant other Available Help at Discharge: Family;Available 24 hours/day Type of Home: House Home Access: Stairs to enter Entrance Stairs-Rails: Left Home Layout: One level Home Equipment: Walker - 2 wheels;Cane - single point;Bedside commode;Shower seat Additional Comments: Lives with wife. Wife watches grandson 5-6 days/wk at the James home and will be available.     Prior Function Level of Independence: Independent      Comments: Ambulating without AD.  Works full-time as a Theme park manager.  Ind with ADLs. No falls.   PT Goals (current goals can now be found in the care plan section) Acute Rehab PT Goals Patient Stated Goal: to regain independence PT Goal Formulation: With patient Time For Goal Achievement: 12/15/18 Potential to Achieve Goals: Good Progress towards PT goals: Progressing toward goals    Frequency    BID      PT Plan Current plan remains appropriate    Co-evaluation              AM-PAC PT "6 Clicks" Mobility   Outcome Measure  Help needed turning from your back to your side while in a flat bed without using bedrails?: A Little Help needed moving from lying on your back to sitting on the side of a flat bed without using bedrails?: A Little Help needed moving to and from a bed to a chair (including a wheelchair)?: A Little Help needed standing up from a chair using your arms (e.g., wheelchair or bedside chair)?: A Little Help needed to walk in hospital room?: A Little Help needed climbing 3-5 steps with a railing? : A Little 6 Click Score: 18    End of Session Equipment Utilized During Treatment: Gait belt Activity Tolerance: Patient tolerated treatment well Patient left: in chair;with call bell/phone within reach;with family/visitor present;with SCD's reapplied;Other (comment)(polar care  and bone foam in place) Nurse Communication: Mobility status PT Visit Diagnosis: Pain;Other abnormalities of gait and mobility (R26.89);Muscle weakness (generalized) (M62.81);Unsteadiness on feet (R26.81) Pain - Right/Left: Right Pain - part of body: Knee     Time: 7035-0093 PT Time Calculation (min) (ACUTE ONLY): 20 min  Charges:  $Gait Training: 8-22 mins                     Collie Siad PT, DPT 12/01/2018, 1:47 PM

## 2018-12-01 NOTE — Evaluation (Signed)
Occupational Therapy Evaluation Patient Details Name: James Compton MRN: [id number] DOB: May 10, 1951 Today's Date: 12/01/2018    History of Present Illness Pt is a 68 y/o M s/p R TKA.  Pt's PMH includes vertigo.    Clinical Impression   Pt seen for OT evaluation this date, POD#1 from above surgery. Pt was independent in all ADL prior to surgery,, working FT as a Theme park manager, and is eager to return to PLOF with less pain and improved safety and independence. Pt currently requires minimal assist for LB dressing/bathing while in seated position due to limited AROM of R knee. Pt/spouse/father instructed in polar care mgt, falls prevention strategies, home/routines modifications, DME/AE for LB bathing and dressing tasks, and compression stocking mgt. All verbalized understanding of all education/training provided and denied additional concerns about return home. Do not currently anticipate any additional acute OT needs or following this hospitalization. Will sign off.     Follow Up Recommendations  No OT follow up    Equipment Recommendations  None recommended by OT    Recommendations for Other Services       Precautions / Restrictions Precautions Precautions: Fall;Knee Precaution Booklet Issued: Yes (comment) Precaution Comments: Instructed pt in no pillow under knee and provided pt with exercise handout Required Braces or Orthoses: Knee Immobilizer - Right Knee Immobilizer - Right: Other (comment)("if unable to perform SLR") Restrictions Weight Bearing Restrictions: Yes RLE Weight Bearing: Weight bearing as tolerated      Mobility Bed Mobility             General bed mobility comments: deferred, up in recliner  Transfers Overall transfer level: Needs assistance Equipment used: Rolling walker (2 wheeled) Transfers: Sit to/from Stand Sit to Stand: Min guard            Balance Overall balance assessment: Needs assistance Sitting-balance support: No upper extremity  supported;Feet supported Sitting balance-Leahy Scale: Good     Standing balance support: No upper extremity supported;During functional activity Standing balance-Leahy Scale: Fair                           ADL either performed or assessed with clinical judgement   ADL Overall ADL's : Needs assistance/impaired                                       General ADL Comments: Min A for LB ADL, spouse able to provide needed level of assist     Vision Baseline Vision/History: Wears glasses Wears Glasses: At all times Patient Visual Report: No change from baseline       Perception     Praxis      Pertinent Vitals/Pain Pain Assessment: No/denies pain Pain Score: 4  Pain Location: R knee Pain Descriptors / Indicators: Discomfort Pain Intervention(s): Monitored during session     Hand Dominance Right   Extremity/Trunk Assessment Upper Extremity Assessment Upper Extremity Assessment: Overall WFL for tasks assessed   Lower Extremity Assessment Lower Extremity Assessment: Overall WFL for tasks assessed(expected post-op strength/ROM deficits to RLE) RLE Deficits / Details: Unable to formally assess s/p R TKA but functionally strength grossly at least 3/5 RLE Sensation: WNL   Cervical / Trunk Assessment Cervical / Trunk Assessment: Normal   Communication Communication Communication: No difficulties   Cognition Arousal/Alertness: Awake/alert Behavior During Therapy: WFL for tasks assessed/performed Overall Cognitive Status: Within Functional Limits for tasks assessed  General Comments  Pt noting mildly lightheaded seated in recliner. BP 118/69. SCD's applied and BLE elevated in recliner at end of session, BP 137/74 and pt reporting lightheadedness resolved    Exercises Other Exercises Other Exercises: pt/spouse/father educated in self care skills, home/routines modifications, falls prevention  strategies, polar care mgt, compression stocking mgt, and AE/DME for ADL - all verbalized understanding   Shoulder Instructions      Home Living Family/patient expects to be discharged to:: Private residence Living Arrangements: Spouse/significant other Available Help at Discharge: Family;Available 24 hours/day Type of Home: House Home Access: Stairs to enter [employer] of Steps: 3 Entrance Stairs-Rails: Left Home Layout: One level     Bathroom Shower/Tub: Walk-in shower;Tub/shower unit(has both)   Bathroom Toilet: Standard     Home Equipment: Walker - 2 wheels;Cane - single point;Bedside commode;Shower seat   Additional Comments: Lives with wife. Wife watches grandson 5-6 days/wk at the pt's home and will be available.       Prior Functioning/Environment Level of Independence: Independent        Comments: Ambulating without AD.  Works full-time as a Theme park manager.  Ind with ADLs. No falls.        OT Problem List: Decreased range of motion;Decreased strength;Impaired balance (sitting and/or standing)      OT Treatment/Interventions:      OT Goals(Current goals can be found in the care plan section) Acute Rehab OT Goals Patient Stated Goal: to regain independence OT Goal Formulation: All assessment and education complete, DC therapy  OT Frequency:     Barriers to D/C:            Co-evaluation              AM-PAC OT "6 Clicks" Daily Activity     Outcome Measure Help from another person eating meals?: None Help from another person taking care of personal grooming?: None Help from another person toileting, which includes using toliet, bedpan, or urinal?: A Little Help from another person bathing (including washing, rinsing, drying)?: A Little Help from another person to put on and taking off regular upper body clothing?: None Help from another person to put on and taking off regular lower body clothing?: A Little 6 Click Score: 21   End of Session     Activity Tolerance: Patient tolerated treatment well Patient left: in chair;with call bell/phone within reach;with chair alarm set;with family/visitor present;with SCD's reapplied;Other (comment)(polar care in place)  OT Visit Diagnosis: Other abnormalities of gait and mobility (R26.89)                Time: 2800-3491 OT Time Calculation (min): 25 min Charges:  OT General Charges $OT Visit: 1 Visit OT Evaluation $OT Eval Low Complexity: 1 Low OT Treatments $Self Care/Home Management : 8-22 mins  Jeni Salles, MPH, MS, OTR/L ascom [phone] 12/01/18, 10:44 AM

## 2018-12-01 NOTE — Care Management (Signed)
[facility] outpatient therapy and set up appointment with ok of Dr. Marry Guan Patient does prefer to use PT therapist [facility] Appointment scheduled for 330 Friday 12/04/2018

## 2018-12-01 NOTE — Discharge Summary (Signed)
Physician Discharge Summary  Patient ID: James Compton MRN: [id number] DOB/AGE: March 02, 1951 68 y.o.  Admit date: 11/30/2018 Discharge date: 12/02/2018  Admission Diagnoses:  PRIMARY OSTEOARTHRITIS OF RIGHT KNEE   Discharge Diagnoses: Patient Active Problem List   Diagnosis Date Noted  . Total knee replacement status 11/30/2018  . Plantar fasciitis, right 07/02/2018  . Osteoarthritis of right knee 07/02/2018  . OSA (obstructive sleep apnea) 12/09/2017  . Essential hypertension 11/04/2016  . Hyperlipidemia 11/04/2016  . Obesity 11/04/2016  . TIA (transient ischemic attack) 11/03/2016  . Aortic ectasia, abdominal ([place]) 10/21/2016    Past Medical History:  Diagnosis Date  . Arthritis    Osteo - knees  . Complication of anesthesia    bowels "feel asleep" after knee surgery  . GERD (gastroesophageal reflux disease)    occas  . Hypertension   . Pneumonia   . PONV (postoperative nausea and vomiting)   . Sleep apnea   . Status post arthroscopy 04/11/2017   Right Knee May 2018 Dr. Marry Guan  . TIA (transient ischemic attack) 11/03/2016  . Vertigo 01/2011   1 time event, pt had drank several cups of coffee that day.  . Wears hearing aid    bilateral  . Wears partial dentures    upper     Transfusion: No transfusions on this admission   Consultants (if any):   Discharged Condition: Improved  Hospital Course: James Compton is an 68 y.o. male who was admitted 11/30/2018 with a diagnosis of degenerative arthrosis right knee and went to the operating room on 11/30/2018 and underwent the above named procedures.    Surgeries:Procedure(s): COMPUTER ASSISTED TOTAL KNEE ARTHROPLASTY on 11/30/2018  PRE-OPERATIVE DIAGNOSIS: Degenerative arthrosis of the right knee, primary  POST-OPERATIVE DIAGNOSIS:  Same  PROCEDURE:  Right total knee arthroplasty using computer-assisted navigation  SURGEON:  Marciano Sequin. M.D.  ASSISTANT:  Liliane Bade, PA-C (present and scrubbed throughout  the case, critical for assistance with exposure, retraction, instrumentation, and closure)  ANESTHESIA: spinal  ESTIMATED BLOOD LOSS: 50 mL  FLUIDS REPLACED: 1300 mL of crystalloid  TOURNIQUET TIME: 86 minutes  DRAINS: 2 medium Hemovac drains  SOFT TISSUE RELEASES: Anterior cruciate ligament, posterior cruciate ligament, deep medial collateral ligament, patellofemoral ligament  IMPLANTS UTILIZED: DePuy Attune size 7 posterior stabilized femoral component (cemented), size 6 rotating platform tibial component (cemented), 41 mm medialized dome patella (cemented), and a 5 mm stabilized rotating platform polyethylene insert.  INDICATIONS FOR SURGERY: James Compton is a 68 y.o. year old male with a long history of progressive knee pain. X-rays demonstrated severe degenerative changes in tricompartmental fashion. The patient had not seen any significant improvement despite conservative nonsurgical intervention. After discussion of the risks and benefits of surgical intervention, the patient expressed understanding of the risks benefits and agree with plans for total knee arthroplasty.   The risks, benefits, and alternatives were discussed at length including but not limited to the risks of infection, bleeding, nerve injury, stiffness, blood clots, the need for revision surgery, cardiopulmonary complications, among others, and they were willing to proceed. Patient tolerated the surgery well. No complications .Patient was taken to PACU where she was stabilized and then transferred to the orthopedic floor.  Patient started on Lovenox 30 mg q 12 hrs. Foot pumps applied bilaterally at 80 mm hgb. Heels elevated off bed with rolled towels. No evidence of DVT. Calves non tender. Negative Homan. Physical therapy started on day #1 for gait training and transfer with OT starting on  day #1 for ADL and assisted devices. Patient has done well with therapy. Ambulated greater than 200 feet upon being  discharged.  Patient's IV And Foley were discontinued on day #1 with Hemovac being discontinued on day #2. Dressing was changed on day 2 prior to patient being discharged   He was given perioperative antibiotics:  Anti-infectives (From admission, onward)   Start     Dose/Rate Route Frequency Ordered Stop   11/30/18 1400  clindamycin (CLEOCIN) IVPB 600 mg     600 mg 100 mL/hr over 30 Minutes Intravenous Every 6 hours 11/30/18 1328 12/01/18 1359   11/30/18 0811  gentamicin (GARAMYCIN) 80 mg in sodium chloride 0.9 % 500 mL irrigation  Status:  Discontinued       As needed 11/30/18 0820 11/30/18 1044   11/30/18 0600  clindamycin (CLEOCIN) IVPB 900 mg     900 mg 100 mL/hr over 30 Minutes Intravenous On call to O.R. 11/30/18 0028 11/30/18 0800   11/30/18 0548  clindamycin (CLEOCIN) 900 MG/50ML IVPB    Note to Pharmacy:  Sylvester Harder   : cabinet override      11/30/18 0548 11/30/18 0748    .  He was fitted with AV 1 compression foot pump devices, instructed on heel pumps, early ambulation, and fitted with TED stockings bilaterally for DVT prophylaxis.  He benefited maximally from the hospital stay and there were no complications.    Recent vital signs:  Vitals:   12/01/18 0000 12/01/18 0313  BP: 121/66 114/74  Pulse: 63 65  Resp: 18 18  Temp: 97.7 F (36.5 C) (!) 97.3 F (36.3 C)  SpO2: 97% 98%    Recent laboratory studies:  Lab Results  Component Value Date   HGB 15.3 11/11/2018   HGB 15.0 07/02/2018   HGB 16.0 04/06/2017   Lab Results  Component Value Date   WBC 6.7 11/11/2018   PLT 201 11/11/2018   Lab Results  Component Value Date   INR 0.96 11/11/2018   Lab Results  Component Value Date   NA 140 11/11/2018   K 3.4 (L) 11/11/2018   CL 106 11/11/2018   CO2 25 11/11/2018   BUN 13 11/11/2018   CREATININE 0.79 11/11/2018   GLUCOSE 105 (H) 11/11/2018    Discharge Medications:   Allergies as of 12/01/2018      Reactions   Dipyridamole    Headaches, nausea,  upset stomach   Penicillins Nausea Only   Has patient had a PCN reaction causing immediate rash, facial/tongue/throat swelling, SOB or lightheadedness with hypotension: no Has patient had a PCN reaction causing severe rash involving mucus membranes or skin necrosis: no Has patient had a PCN reaction that required hospitalization: no Has patient had a PCN reaction occurring within the last 10 years: no If all of the above answers are "NO", then may proceed with Cephalosporin use.      Medication List    STOP taking these medications   aspirin 325 MG tablet Commonly known as:  BAYER ASPIRIN     TAKE these medications   acetaminophen 500 MG tablet Commonly known as:  TYLENOL Take 1,000 mg by mouth every 8 (eight) hours as needed (pain).   atorvastatin 80 MG tablet Commonly known as:  LIPITOR Take 1 tablet (80 mg total) by mouth daily at 6 PM.   CoQ10 100 MG Caps Take 2 capsules by mouth daily.   enoxaparin 40 MG/0.4ML injection Commonly known as:  LOVENOX Inject 0.4 mLs (40 mg total)  into the skin daily for 14 days.   fluticasone 50 MCG/ACT nasal spray Commonly known as:  FLONASE Place 2 sprays into both nostrils daily as needed for allergies or rhinitis.   gentamicin ointment 0.1 % Commonly known as:  GARAMYCIN Apply 1 application topically 2 (two) times daily. Apply to affected area 3 times a day   losartan 25 MG tablet Commonly known as:  COZAAR TAKE 1 TABLET BY MOUTH EVERY DAY   multivitamin capsule Take 1 capsule by mouth daily. PM   omeprazole 40 MG capsule Commonly known as:  PRILOSEC Take 40 mg by mouth daily.   oxyCODONE 5 MG immediate release tablet Commonly known as:  Oxy IR/ROXICODONE Take 1 tablet (5 mg total) by mouth every 4 (four) hours as needed for moderate pain (pain score 4-6).   PROBIOTIC DAILY PO Take 1 capsule by mouth daily. PM   traMADol 50 MG tablet Commonly known as:  ULTRAM Take 1-2 tablets (50-100 mg total) by mouth every 4 (four)  hours as needed for moderate pain. What changed:    how much to take  when to take this            Durable Medical Equipment  (From admission, onward)         Start     Ordered   11/30/18 1329  DME Walker rolling  Once    Question:  Patient needs a walker to treat with the following condition  Answer:  Total knee replacement status   11/30/18 1328   11/30/18 1329  DME Bedside commode  Once    Question:  Patient needs a bedside commode to treat with the following condition  Answer:  Total knee replacement status   11/30/18 1328          Diagnostic Studies: US Aorta/ivc/iliacs [place]  Result Date: 11/09/2018 CLINICAL DATA:  Aortic ectasia. Hypertension, prior TIA, hyperlipidemia, previous tobacco abuse. EXAM: ULTRASOUND OF ABDOMINAL AORTA TECHNIQUE: Ultrasound examination of the abdominal aorta was performed to evaluate for abdominal aortic aneurysm. COMPARISON:  01/17/2014 FINDINGS: Aortic Atherosclerosis (ICD10-170.0).  Measurements as follows: Proximal:  2.6 x 2.4 cm Mid:  2.1 x 2 cm Distal:  1.9 x 1.9 cm, aortic velocity 103 cm/sec Proximal right common iliac: 1.2 x 1.2 cm Proximal left common iliac artery: 1.4 x 1.2 cm IMPRESSION: 1. Ectatic abdominal aorta at risk for aneurysm development. Recommend followup by ultrasound in 5 years. This recommendation follows ACR consensus guidelines: White Paper of the ACR Incidental Findings Committee II on Vascular Findings. J Am Coll Radiol 2013; 10:789-794. Electronically Signed   By: Lucrezia Europe M.D.   On: 11/09/2018 11:08   Dg Knee Right Port  Result Date: 11/30/2018 CLINICAL DATA:  Status post right knee replacement EXAM: PORTABLE RIGHT KNEE - 2 VIEW COMPARISON:  None. FINDINGS: Right knee replacement is noted. No acute bony or soft tissue abnormality is seen. Surgical drains are noted in place. IMPRESSION: Status post right knee replacement Electronically Signed   By: Inez Catalina M.D.   On: 11/30/2018 11:11    Disposition:    Discharge Instructions    Increase activity slowly   Complete by:  As directed       Follow-up Information    Watt Climes, PA On 12/15/2018.   Specialty:  Physician Assistant Why:  at 9:45am Contact information: [address] [place] [address] [phone]        Dereck Leep, MD On 01/12/2019.   Specialty:  Orthopedic Surgery Why:  at 10:45am Contact information: [place] [address] [phone]            Signed: Watt Climes 12/01/2018, 7:51 AM

## 2018-12-01 NOTE — Anesthesia Postprocedure Evaluation (Signed)
Anesthesia Post Note  Patient: James Compton  Procedure(s) Performed: COMPUTER ASSISTED TOTAL KNEE ARTHROPLASTY (Right Knee)  Patient location during evaluation: Nursing Unit Anesthesia Type: Spinal Level of consciousness: awake, awake and alert, oriented and patient cooperative Pain management: pain level controlled Vital Signs Assessment: post-procedure vital signs reviewed and stable Respiratory status: spontaneous breathing, nonlabored ventilation and respiratory function stable Cardiovascular status: stable Postop Assessment: no headache, no backache, patient able to bend at knees, no apparent nausea or vomiting, adequate PO intake and able to ambulate Anesthetic complications: no     Last Vitals:  Vitals:   12/01/18 0000 12/01/18 0313  BP: 121/66 114/74  Pulse: 63 65  Resp: 18 18  Temp: 36.5 C (!) 36.3 C  SpO2: 97% 98%    Last Pain:  Vitals:   12/01/18 0313  TempSrc: Axillary  PainSc:                  Ricki Miller

## 2018-12-01 NOTE — Progress Notes (Signed)
Clinical Social Worker (CSW) received SNF consult. PT is recommending outpatient PT. RN case manager aware of above. Please reconsult if future social work needs arise. CSW signing off.   Kylieann Eagles, LCSW [phone]  

## 2018-12-01 NOTE — Evaluation (Signed)
Physical Therapy Evaluation Patient Details Name: James Compton MRN: [id number] DOB: 04/14/1951 Today's Date: 12/01/2018   History of Present Illness  Pt is a 68 y/o M s/p R TKA.  Pt's PMH includes vertigo.   Clinical Impression  Pt is s/p TKA resulting in the deficits listed below (see PT Problem List). James Compton ambulated around the nurses station demonstrating proper gait mechanics and remaining steady using RW.  Cues provided for safety with transfers. Pt will benefit from skilled PT to increase their independence and safety with mobility to allow discharge to the venue listed below.     Follow Up Recommendations Outpatient PT    Equipment Recommendations  None recommended by PT    Recommendations for Other Services OT consult     Precautions / Restrictions Precautions Precautions: Fall;Knee Precaution Booklet Issued: Yes (comment) Precaution Comments: Instructed pt in no pillow under knee and provided pt with exercise handout Required Braces or Orthoses: Knee Immobilizer - Right Knee Immobilizer - Right: Other (comment)("if unable to perform SLR") Restrictions Weight Bearing Restrictions: Yes RLE Weight Bearing: Weight bearing as tolerated      Mobility  Bed Mobility Overal bed mobility: Modified Independent             General bed mobility comments: Slightly increased effort but no physical assist or cues needed  Transfers Overall transfer level: Needs assistance Equipment used: Rolling walker (2 wheeled) Transfers: Sit to/from Stand Sit to Stand: Min guard         General transfer comment: Cues for proper hand placement and safe technique. Pt steady with sit<>stand  Ambulation/Gait Ambulation/Gait assistance: Min guard Gait Distance (Feet): 160 Feet Assistive device: Rolling walker (2 wheeled) Gait Pattern/deviations: Step-through pattern;Decreased weight shift to right     General Gait Details: Pt immediately demonstrates step through gait pattern with  no cues needed.  Steady using RW.   Stairs            Wheelchair Mobility    Modified Rankin (Stroke Patients Only)       Balance Overall balance assessment: Needs assistance Sitting-balance support: No upper extremity supported;Feet supported Sitting balance-Leahy Scale: Good     Standing balance support: No upper extremity supported;During functional activity Standing balance-Leahy Scale: Fair Standing balance comment: Pt able to stand statically without UE support but utilizes RW to ambulate.                              Pertinent Vitals/Pain Pain Assessment: 0-10 Pain Score: 4  Pain Location: R knee Pain Descriptors / Indicators: Discomfort Pain Intervention(s): Limited activity within patient's tolerance;Monitored during session;Repositioned    Home Living Family/patient expects to be discharged to:: Private residence Living Arrangements: Spouse/significant other Available Help at Discharge: Family;Available 24 hours/day Type of Home: House Home Access: Stairs to enter Entrance Stairs-Rails: Left Entrance Stairs-Number of Steps: 3 Home Layout: One level Home Equipment: Walker - 2 wheels;Cane - single point;Bedside commode;Shower seat(thinks there are grab bars in walk in shower) Additional Comments: Lives with wife. Wife watches grandson 5-6 days/wk at the pt's home and will be available.     Prior Function Level of Independence: Independent         Comments: Ambulating without AD.  Works full-time as a Theme park manager.  Ind with ADLs.      Hand Dominance        Extremity/Trunk Assessment   Upper Extremity Assessment Upper Extremity Assessment: Overall WFL for tasks  assessed    Lower Extremity Assessment Lower Extremity Assessment: RLE deficits/detail RLE Deficits / Details: Unable to formally assess s/p R TKA but functionally strength grossly at least 3/5 RLE Sensation: WNL    Cervical / Trunk Assessment Cervical / Trunk Assessment:  Normal  Communication   Communication: No difficulties  Cognition Arousal/Alertness: Awake/alert Behavior During Therapy: WFL for tasks assessed/performed Overall Cognitive Status: Within Functional Limits for tasks assessed                                        General Comments      Exercises Total Joint Exercises Ankle Circles/Pumps: AROM;Both;10 reps;Supine Quad Sets: Strengthening;Both;10 reps;Supine Straight Leg Raises: Strengthening;Right;10 reps;Supine Knee Flexion: AAROM;Right;5 reps;Seated;Other (comment)(with 5 second holds) Goniometric ROM: -8 to 95 deg   Assessment/Plan    PT Assessment Patient needs continued PT services  PT Problem List Decreased strength;Decreased range of motion;Decreased activity tolerance;Decreased balance;Decreased knowledge of use of DME;Decreased safety awareness;Decreased knowledge of precautions;Pain       PT Treatment Interventions DME instruction;Gait training;Stair training;Functional mobility training;Therapeutic activities;Therapeutic exercise;Balance training;Neuromuscular re-education;Patient/family education;Modalities    PT Goals (Current goals can be found in the Care Plan section)  Acute Rehab PT Goals Patient Stated Goal: to regain independence PT Goal Formulation: With patient Time For Goal Achievement: 12/15/18 Potential to Achieve Goals: Good    Frequency BID   Barriers to discharge        Co-evaluation               AM-PAC PT "6 Clicks" Mobility  Outcome Measure Help needed turning from your back to your side while in a flat bed without using bedrails?: A Little Help needed moving from lying on your back to sitting on the side of a flat bed without using bedrails?: A Little Help needed moving to and from a bed to a chair (including a wheelchair)?: A Little Help needed standing up from a chair using your arms (e.g., wheelchair or bedside chair)?: A Little Help needed to walk in hospital  room?: A Little Help needed climbing 3-5 steps with a railing? : A Little 6 Click Score: 18    End of Session Equipment Utilized During Treatment: Gait belt Activity Tolerance: Patient tolerated treatment well Patient left: in chair;with call bell/phone within reach;with family/visitor present(OT on her way to work with pt) Nurse Communication: Mobility status;Other (comment)(pt in chair, to work with OT) PT Visit Diagnosis: Pain;Other abnormalities of gait and mobility (R26.89);Muscle weakness (generalized) (M62.81);Unsteadiness on feet (R26.81) Pain - Right/Left: Right Pain - part of body: Knee    Time: 2130-8657 PT Time Calculation (min) (ACUTE ONLY): 30 min   Charges:   PT Evaluation $PT Eval Low Complexity: 1 Low PT Treatments $Gait Training: 8-22 mins        Collie Siad PT, DPT 12/01/2018, 10:08 AM

## 2018-12-01 NOTE — Progress Notes (Signed)
   Subjective: 1 Day Post-Op Procedure(s) (LRB): COMPUTER ASSISTED TOTAL KNEE ARTHROPLASTY (Right) Patient reports pain as mild.  States that most of the pain is secondary to pulling of the hamstrings Patient is well, and has had no acute complaints or problems  Patient states that he is resting well.  No comparison in postop pain from what he had before We will start therapy today.  Plan is to go Home after hospital stay. no nausea and no vomiting Patient denies any chest pains or shortness of breath. Objective: Vital signs in last 24 hours: Temp:  [97 F (36.1 C)-100 F (37.8 C)] 97.3 F (36.3 C) (01/07 0313) Pulse Rate:  [55-94] 65 (01/07 0313) Resp:  [14-18] 18 (01/07 0313) BP: (104-141)/(57-88) 114/74 (01/07 0313) SpO2:  [95 %-100 %] 98 % (01/07 0313) Heels are non tender and elevated off the bed using rolled towels Intake/Output from previous day: 01/06 0701 - 01/07 0700 In: 2668.7 [P.O.:480; I.V.:2038.7; IV Piggyback:150] Out: 3390 [Urine:3075; Drains:265; Blood:50] Intake/Output this shift: No intake/output data recorded.  No results for input(s): HGB in the last 72 hours. No results for input(s): WBC, RBC, HCT, PLT in the last 72 hours. No results for input(s): NA, K, CL, CO2, BUN, CREATININE, GLUCOSE, CALCIUM in the last 72 hours. No results for input(s): LABPT, INR in the last 72 hours.  EXAM General - Patient is Alert, Appropriate and Oriented Extremity - Neurologically intact Neurovascular intact Sensation intact distally Intact pulses distally Dorsiflexion/Plantar flexion intact Compartment soft Dressing - dressing C/D/I Motor Function - intact, moving foot and toes well on exam.  Able to do straight leg raise on his own  Past Medical History:  Diagnosis Date  . Arthritis    Osteo - knees  . Complication of anesthesia    bowels "feel asleep" after knee surgery  . GERD (gastroesophageal reflux disease)    occas  . Hypertension   . Pneumonia   . PONV  (postoperative nausea and vomiting)   . Sleep apnea   . Status post arthroscopy 04/11/2017   Right Knee May 2018 Dr. Marry Guan  . TIA (transient ischemic attack) 11/03/2016  . Vertigo 01/2011   1 time event, pt had drank several cups of coffee that day.  . Wears hearing aid    bilateral  . Wears partial dentures    upper    Assessment/Plan: 1 Day Post-Op Procedure(s) (LRB): COMPUTER ASSISTED TOTAL KNEE ARTHROPLASTY (Right) Active Problems:   Total knee replacement status  Estimated body mass index is 31.41 kg/m as calculated from the following:   Height as of this encounter: 5\' 8"  (1.727 m).   Weight as of this encounter: 93.7 kg. Advance diet Up with therapy D/C IV fluids Plan for discharge tomorrow Discharge home with home health  Labs: None DVT Prophylaxis - Lovenox, Foot Pumps and TED hose Weight-Bearing as tolerated to right leg D/C O2 and Pulse OX and try on Room Air Begin working on bowel movement   R. [place] 12/01/2018, 7:46 AM

## 2018-12-02 MED ORDER — LACTULOSE 10 GM/15ML PO SOLN: 10.0000 g | Freq: Two times a day (BID) | ORAL | Status: DC | PRN

## 2018-12-02 NOTE — Progress Notes (Signed)
Physical Therapy Treatment Patient Details Name: James Compton MRN: [id number] DOB: [birth year] Today's Date: 12/02/2018    History of Present Illness Pt is a 68 y/o M s/p R TKA.  Pt's PMH includes vertigo.     PT Comments    Mr. Boozer continues to make excellent progress with therapy.  Pt ambulated x3 around nurses station with min verbal cues for improved gait mechanics for greater functional R knee F.  Education provided for safety with transfers and safety in the home.  Follow up recommendations remain appropriate.     Follow Up Recommendations  Outpatient PT     Equipment Recommendations  None recommended by PT    Recommendations for Other Services       Precautions / Restrictions Precautions Precautions: Fall;Knee Required Braces or Orthoses: Knee Immobilizer - Right Knee Immobilizer - Right: Other (comment)("if unable to perform SLR") Restrictions Weight Bearing Restrictions: Yes RLE Weight Bearing: Weight bearing as tolerated    Mobility  Bed Mobility               General bed mobility comments: Pt sitting in chair at start and end of session  Transfers Overall transfer level: Needs assistance Equipment used: Rolling walker (2 wheeled) Transfers: Sit to/from Stand Sit to Stand: Supervision         General transfer comment: With sit>stand from chair pt stands before PT had brought RW for the pt.  Educated the pt on safety with transfers and use of RW.    Ambulation/Gait Ambulation/Gait assistance: Supervision Gait Distance (Feet): 450 Feet Assistive device: Rolling walker (2 wheeled) Gait Pattern/deviations: Step-through pattern;Decreased weight shift to right     General Gait Details: Cues to keep R foot pointed forward as he demonstrates slight compensatory R hip ER.  Supervision for safety.    Stairs             Wheelchair Mobility    Modified Rankin (Stroke Patients Only)       Balance Overall balance assessment: Needs  assistance Sitting-balance support: No upper extremity supported;Feet supported Sitting balance-Leahy Scale: Good     Standing balance support: No upper extremity supported;During functional activity Standing balance-Leahy Scale: Fair Standing balance comment: Pt able to stand statically without UE support but utilizes RW to ambulate.                             Cognition Arousal/Alertness: Awake/alert Behavior During Therapy: WFL for tasks assessed/performed Overall Cognitive Status: Within Functional Limits for tasks assessed                                        Exercises Total Joint Exercises Quad Sets: Strengthening;Both;Seated;Other (comment);20 reps(with pt in bone foam) Straight Leg Raises: Strengthening;10 reps;Right;Seated Long Arc Quad: Strengthening;Right;Seated;5 reps;Other (comment)(with 5 second holds) Knee Flexion: AAROM;Right;Seated;Other (comment);10 reps(with 5 second holds) Goniometric ROM: 0-114 deg    General Comments General comments (skin integrity, edema, etc.): Reviewed safety in the home including proper footware, home layout with furniture, tripping hazards, etc.       Pertinent Vitals/Pain Pain Assessment: 0-10 Pain Score: 4  Pain Location: R knee Pain Descriptors / Indicators: Discomfort Pain Intervention(s): Limited activity within patient's tolerance;Monitored during session;Ice applied;Patient requesting pain meds-RN notified    Home Living  Prior Function            PT Goals (current goals can now be found in the care plan section) Acute Rehab PT Goals Patient Stated Goal: to go home today PT Goal Formulation: With patient Time For Goal Achievement: 12/15/18 Potential to Achieve Goals: Good Progress towards PT goals: Progressing toward goals    Frequency    BID      PT Plan Current plan remains appropriate    Co-evaluation              AM-PAC PT "6 Clicks"  Mobility   Outcome Measure  Help needed turning from your back to your side while in a flat bed without using bedrails?: A Little Help needed moving from lying on your back to sitting on the side of a flat bed without using bedrails?: A Little Help needed moving to and from a bed to a chair (including a wheelchair)?: A Little Help needed standing up from a chair using your arms (e.g., wheelchair or bedside chair)?: A Little Help needed to walk in hospital room?: A Little Help needed climbing 3-5 steps with a railing? : A Little 6 Click Score: 18    End of Session Equipment Utilized During Treatment: Gait belt Activity Tolerance: Patient tolerated treatment well Patient left: in chair;with call bell/phone within reach;with family/visitor present;with SCD's reapplied;Other (comment);with chair alarm set(polar care and bone foam in place) Nurse Communication: Mobility status;Patient requests pain meds PT Visit Diagnosis: Pain;Other abnormalities of gait and mobility (R26.89);Muscle weakness (generalized) (M62.81);Unsteadiness on feet (R26.81) Pain - Right/Left: Right Pain - part of body: Knee     Time: 0920-0946 PT Time Calculation (min) (ACUTE ONLY): 26 min  Charges:  $Gait Training: 8-22 mins $Therapeutic Exercise: 8-22 mins                     Collie Siad PT, DPT 12/02/2018, 10:34 AM

## 2018-12-02 NOTE — Progress Notes (Signed)
   Subjective: 2 Days Post-Op Procedure(s) (LRB): COMPUTER ASSISTED TOTAL KNEE ARTHROPLASTY (Right) Patient reports pain as mild.   Patient is well, and has had no acute complaints or problems Patient did extremely well with therapy yesterday.  Was able to ambulate around the nurses desk in the morning and did 2 laps in the afternoon.  Range of motion 0 to 95 degrees. Plan is to go Home after hospital stay. no nausea and no vomiting Patient denies any chest pains or shortness of breath. Objective: Vital signs in last 24 hours: Temp:  [97.5 F (36.4 C)-97.8 F (36.6 C)] 97.8 F (36.6 C) (01/07 2300) Pulse Rate:  [59-62] 59 (01/07 2300) Resp:  [17-18] 17 (01/07 2300) BP: (121-141)/(64-86) 121/64 (01/07 2300) SpO2:  [97 %-100 %] 97 % (01/07 2300) well approximated incision Heels are non tender and elevated off the bed using rolled towels Intake/Output from previous day: 01/07 0701 - 01/08 0700 In: 1070 [P.O.:840] Out: 225 [Drains:225] Intake/Output this shift: No intake/output data recorded.  No results for input(s): HGB in the last 72 hours. No results for input(s): WBC, RBC, HCT, PLT in the last 72 hours. No results for input(s): NA, K, CL, CO2, BUN, CREATININE, GLUCOSE, CALCIUM in the last 72 hours. No results for input(s): LABPT, INR in the last 72 hours.  EXAM General - Patient is Alert, Appropriate and Oriented Extremity - Neurologically intact Neurovascular intact Sensation intact distally Intact pulses distally Dorsiflexion/Plantar flexion intact No cellulitis present Compartment soft Dressing - scant drainage Motor Function - intact, moving foot and toes well on exam.    Past Medical History:  Diagnosis Date  . Arthritis    Osteo - knees  . Complication of anesthesia    bowels "feel asleep" after knee surgery  . GERD (gastroesophageal reflux disease)    occas  . Hypertension   . Pneumonia   . PONV (postoperative nausea and vomiting)   . Sleep apnea   .  Status post arthroscopy 04/11/2017   Right Knee May 2018 Dr. Marry Guan  . TIA (transient ischemic attack) 11/03/2016  . Vertigo 01/2011   1 time event, pt had drank several cups of coffee that day.  . Wears hearing aid    bilateral  . Wears partial dentures    upper    Assessment/Plan: 2 Days Post-Op Procedure(s) (LRB): COMPUTER ASSISTED TOTAL KNEE ARTHROPLASTY (Right) Active Problems:   Total knee replacement status  Estimated body mass index is 31.41 kg/m as calculated from the following:   Height as of this encounter: 5\' 8"  (1.727 m).   Weight as of this encounter: 93.7 kg. Up with therapy Discharge home with home health  Labs: None DVT Prophylaxis - Lovenox, Foot Pumps and TED hose Weight-Bearing as tolerated to right leg Hemovac discontinued today.  Into the drain appeared to be intact. Patient needs a bowel movement Please wash operative leg, change dressing and apply TED stockings to both legs prior to discharge Please give the patient 2 extra honeycomb dressings to take home  [place]. [place] 12/02/2018, 8:01 AM

## 2018-12-02 NOTE — Care Management (Signed)
Patient declines Home health and has appointment scheduled for 12/04/2018 for Outpatient therpay

## 2018-12-04 DIAGNOSIS — Z9889 Other specified postprocedural states: Secondary | ICD-10-CM | POA: Diagnosis not present

## 2018-12-04 DIAGNOSIS — M25561 Pain in right knee: Secondary | ICD-10-CM | POA: Diagnosis not present

## 2018-12-04 DIAGNOSIS — M25661 Stiffness of right knee, not elsewhere classified: Secondary | ICD-10-CM | POA: Diagnosis not present

## 2018-12-04 DIAGNOSIS — G8929 Other chronic pain: Secondary | ICD-10-CM | POA: Diagnosis not present

## 2018-12-04 DIAGNOSIS — R531 Weakness: Secondary | ICD-10-CM | POA: Diagnosis not present

## 2018-12-07 DIAGNOSIS — G8929 Other chronic pain: Secondary | ICD-10-CM | POA: Diagnosis not present

## 2018-12-07 DIAGNOSIS — Z9889 Other specified postprocedural states: Secondary | ICD-10-CM | POA: Diagnosis not present

## 2018-12-07 DIAGNOSIS — R531 Weakness: Secondary | ICD-10-CM | POA: Diagnosis not present

## 2018-12-07 DIAGNOSIS — M25561 Pain in right knee: Secondary | ICD-10-CM | POA: Diagnosis not present

## 2018-12-07 DIAGNOSIS — M25661 Stiffness of right knee, not elsewhere classified: Secondary | ICD-10-CM | POA: Diagnosis not present

## 2018-12-09 DIAGNOSIS — M25561 Pain in right knee: Secondary | ICD-10-CM | POA: Diagnosis not present

## 2018-12-09 DIAGNOSIS — Z9889 Other specified postprocedural states: Secondary | ICD-10-CM | POA: Diagnosis not present

## 2018-12-11 DIAGNOSIS — M25561 Pain in right knee: Secondary | ICD-10-CM | POA: Diagnosis not present

## 2018-12-14 DIAGNOSIS — Z9889 Other specified postprocedural states: Secondary | ICD-10-CM | POA: Diagnosis not present

## 2018-12-14 DIAGNOSIS — R531 Weakness: Secondary | ICD-10-CM | POA: Diagnosis not present

## 2018-12-14 DIAGNOSIS — G8929 Other chronic pain: Secondary | ICD-10-CM | POA: Diagnosis not present

## 2018-12-14 DIAGNOSIS — M25561 Pain in right knee: Secondary | ICD-10-CM | POA: Diagnosis not present

## 2018-12-14 DIAGNOSIS — M25661 Stiffness of right knee, not elsewhere classified: Secondary | ICD-10-CM | POA: Diagnosis not present

## 2018-12-16 DIAGNOSIS — M25561 Pain in right knee: Secondary | ICD-10-CM | POA: Diagnosis not present

## 2018-12-16 DIAGNOSIS — Z9889 Other specified postprocedural states: Secondary | ICD-10-CM | POA: Diagnosis not present

## 2018-12-16 DIAGNOSIS — M25661 Stiffness of right knee, not elsewhere classified: Secondary | ICD-10-CM | POA: Diagnosis not present

## 2018-12-16 DIAGNOSIS — G8929 Other chronic pain: Secondary | ICD-10-CM | POA: Diagnosis not present

## 2018-12-16 DIAGNOSIS — R531 Weakness: Secondary | ICD-10-CM | POA: Diagnosis not present

## 2018-12-18 DIAGNOSIS — G8929 Other chronic pain: Secondary | ICD-10-CM | POA: Diagnosis not present

## 2018-12-18 DIAGNOSIS — M25661 Stiffness of right knee, not elsewhere classified: Secondary | ICD-10-CM | POA: Diagnosis not present

## 2018-12-18 DIAGNOSIS — M25561 Pain in right knee: Secondary | ICD-10-CM | POA: Diagnosis not present

## 2018-12-18 DIAGNOSIS — Z9889 Other specified postprocedural states: Secondary | ICD-10-CM | POA: Diagnosis not present

## 2018-12-18 DIAGNOSIS — R531 Weakness: Secondary | ICD-10-CM | POA: Diagnosis not present

## 2018-12-21 DIAGNOSIS — G8929 Other chronic pain: Secondary | ICD-10-CM | POA: Diagnosis not present

## 2018-12-21 DIAGNOSIS — M25661 Stiffness of right knee, not elsewhere classified: Secondary | ICD-10-CM | POA: Diagnosis not present

## 2018-12-21 DIAGNOSIS — M25561 Pain in right knee: Secondary | ICD-10-CM | POA: Diagnosis not present

## 2018-12-21 DIAGNOSIS — R531 Weakness: Secondary | ICD-10-CM | POA: Diagnosis not present

## 2018-12-21 DIAGNOSIS — Z9889 Other specified postprocedural states: Secondary | ICD-10-CM | POA: Diagnosis not present

## 2018-12-23 DIAGNOSIS — Z9889 Other specified postprocedural states: Secondary | ICD-10-CM | POA: Diagnosis not present

## 2018-12-23 DIAGNOSIS — M25661 Stiffness of right knee, not elsewhere classified: Secondary | ICD-10-CM | POA: Diagnosis not present

## 2018-12-23 DIAGNOSIS — M25561 Pain in right knee: Secondary | ICD-10-CM | POA: Diagnosis not present

## 2018-12-23 DIAGNOSIS — G8929 Other chronic pain: Secondary | ICD-10-CM | POA: Diagnosis not present

## 2018-12-23 DIAGNOSIS — R531 Weakness: Secondary | ICD-10-CM | POA: Diagnosis not present

## 2018-12-25 DIAGNOSIS — Z9889 Other specified postprocedural states: Secondary | ICD-10-CM | POA: Diagnosis not present

## 2018-12-25 DIAGNOSIS — M25661 Stiffness of right knee, not elsewhere classified: Secondary | ICD-10-CM | POA: Diagnosis not present

## 2018-12-25 DIAGNOSIS — G8929 Other chronic pain: Secondary | ICD-10-CM | POA: Diagnosis not present

## 2018-12-25 DIAGNOSIS — M25561 Pain in right knee: Secondary | ICD-10-CM | POA: Diagnosis not present

## 2018-12-25 DIAGNOSIS — R531 Weakness: Secondary | ICD-10-CM | POA: Diagnosis not present

## 2018-12-29 DIAGNOSIS — M25561 Pain in right knee: Secondary | ICD-10-CM | POA: Diagnosis not present

## 2018-12-29 DIAGNOSIS — Z9889 Other specified postprocedural states: Secondary | ICD-10-CM | POA: Diagnosis not present

## 2019-01-01 DIAGNOSIS — M25661 Stiffness of right knee, not elsewhere classified: Secondary | ICD-10-CM | POA: Diagnosis not present

## 2019-01-01 DIAGNOSIS — Z9889 Other specified postprocedural states: Secondary | ICD-10-CM | POA: Diagnosis not present

## 2019-01-01 DIAGNOSIS — R531 Weakness: Secondary | ICD-10-CM | POA: Diagnosis not present

## 2019-01-01 DIAGNOSIS — M25561 Pain in right knee: Secondary | ICD-10-CM | POA: Diagnosis not present

## 2019-01-01 DIAGNOSIS — G8929 Other chronic pain: Secondary | ICD-10-CM | POA: Diagnosis not present

## 2019-01-05 DIAGNOSIS — M25561 Pain in right knee: Secondary | ICD-10-CM | POA: Diagnosis not present

## 2019-01-05 DIAGNOSIS — Z9889 Other specified postprocedural states: Secondary | ICD-10-CM | POA: Diagnosis not present

## 2019-01-05 DIAGNOSIS — M25661 Stiffness of right knee, not elsewhere classified: Secondary | ICD-10-CM | POA: Diagnosis not present

## 2019-01-05 DIAGNOSIS — G8929 Other chronic pain: Secondary | ICD-10-CM | POA: Diagnosis not present

## 2019-01-05 DIAGNOSIS — R531 Weakness: Secondary | ICD-10-CM | POA: Diagnosis not present

## 2019-01-08 DIAGNOSIS — Z9889 Other specified postprocedural states: Secondary | ICD-10-CM | POA: Diagnosis not present

## 2019-01-08 DIAGNOSIS — M25561 Pain in right knee: Secondary | ICD-10-CM | POA: Diagnosis not present

## 2019-01-08 DIAGNOSIS — G8929 Other chronic pain: Secondary | ICD-10-CM | POA: Diagnosis not present

## 2019-01-11 DIAGNOSIS — M25661 Stiffness of right knee, not elsewhere classified: Secondary | ICD-10-CM | POA: Diagnosis not present

## 2019-01-11 DIAGNOSIS — G8929 Other chronic pain: Secondary | ICD-10-CM | POA: Diagnosis not present

## 2019-01-11 DIAGNOSIS — R531 Weakness: Secondary | ICD-10-CM | POA: Diagnosis not present

## 2019-01-11 DIAGNOSIS — Z9889 Other specified postprocedural states: Secondary | ICD-10-CM | POA: Diagnosis not present

## 2019-01-11 DIAGNOSIS — M25561 Pain in right knee: Secondary | ICD-10-CM | POA: Diagnosis not present

## 2019-01-12 DIAGNOSIS — Z96651 Presence of right artificial knee joint: Secondary | ICD-10-CM | POA: Diagnosis not present

## 2019-01-12 DIAGNOSIS — M1711 Unilateral primary osteoarthritis, right knee: Secondary | ICD-10-CM | POA: Diagnosis not present

## 2019-02-01 ENCOUNTER — Encounter: Payer: Self-pay | Admitting: Family Medicine

## 2019-02-01 ENCOUNTER — Ambulatory Visit (INDEPENDENT_AMBULATORY_CARE_PROVIDER_SITE_OTHER): Payer: Medicare Other | Admitting: Family Medicine

## 2019-02-01 VITALS — BP 138/68 | HR 65 | Temp 98.4°F | Resp 16 | Ht 68.0 in | Wt 208.0 lb

## 2019-02-01 DIAGNOSIS — G4733 Obstructive sleep apnea (adult) (pediatric): Secondary | ICD-10-CM | POA: Diagnosis not present

## 2019-02-01 DIAGNOSIS — E785 Hyperlipidemia, unspecified: Secondary | ICD-10-CM | POA: Diagnosis not present

## 2019-02-01 DIAGNOSIS — I10 Essential (primary) hypertension: Secondary | ICD-10-CM | POA: Diagnosis not present

## 2019-02-01 DIAGNOSIS — I77811 Abdominal aortic ectasia: Secondary | ICD-10-CM

## 2019-02-01 NOTE — Progress Notes (Signed)
Patient: James Compton Male    DOB: 11/24/1951   68 y.o.   MRN: [id number] Visit Date: 02/01/2019  Today's Provider: Lelon Huh, MD   Chief Complaint  Patient presents with  . Hypertension  . Transient Ischemic Attack  . Sleep Apnea  . Hyperlipidemia   Subjective:     HPI  Hypertension, follow-up:  BP Readings from Last 3 Encounters:  02/01/19 (!) 146/78  12/02/18 130/67  11/20/18 128/62    He was last seen for hypertension 3 months ago.  BP at that visit was 126/74. Management since that visit includes no changes. He reports good compliance with treatment. He is not having side effects.  He is exercising. He is not adherent to low salt diet.   Outside blood pressures are occasionally checked. He is experiencing none.  Patient denies chest pain, chest pressure/discomfort, claudication, dyspnea, exertional chest pressure/discomfort, fatigue, irregular heart beat, lower extremity edema, near-syncope, orthopnea, palpitations, paroxysmal nocturnal dyspnea, syncope and tachypnea.   Cardiovascular risk factors include advanced age (older than 65 for men, 79 for women), hypertension and male gender.  Use of agents associated with hypertension: none.     Weight trend: stable Wt Readings from Last 3 Encounters:  02/01/19 208 lb (94.3 kg)  11/30/18 206 lb 9.6 oz (93.7 kg)  11/20/18 206 lb 9.6 oz (93.7 kg)    Current diet: well balanced  ------------------------------------------------------------------------  Follow up for TIA:  The patient was last seen for this 3 months ago. Changes made at last visit include none.  He reports good compliance with treatment. He feels that condition is stable. He is not having side effects.   ------------------------------------------------------------------------------------   Follow up for OSA:  The patient was last seen for this 3 months ago. Changes made at last visit include none; patient was advised to continue  CPAP.  He reports good compliance with treatment. He feels that condition is Improved. He is not having side effects.   ------------------------------------------------------------------------------------   Lipid/Cholesterol, Follow-up:   Last seen for this7 months ago.  Management changes since that visit include none. . Last Lipid Panel:    Component Value Date/Time   CHOL 105 07/02/2018 0834   TRIG 92 07/02/2018 0834   HDL 42 07/02/2018 0834   CHOLHDL 2.5 07/02/2018 0834   CHOLHDL 4.4 11/04/2016 0351   VLDL 38 11/04/2016 0351   LDLCALC 45 07/02/2018 0834    Risk factors for vascular disease include hypercholesterolemia and hypertension  He reports good compliance with treatment. He is not having side effects.  Current symptoms include none and have been stable. Weight trend: stable Prior visit with dietician: no Current diet: well balanced Current exercise: cardiovascular workout on exercise equipment  Wt Readings from Last 3 Encounters:  02/01/19 208 lb (94.3 kg)  11/30/18 206 lb 9.6 oz (93.7 kg)  11/20/18 206 lb 9.6 oz (93.7 kg)    -------------------------------------------------------------------  Follow up for Osteoarthritis of right knee:  The patient was last seen for this 7 months ago. Changes made at last visit include none; this problem is followed by Dr. Marry Guan.  He reports good compliance with treatment. He feels that condition is stable. He is not having side effects.   ------------------------------------------------------------------------------------  Allergies  Allergen Reactions  . Dipyridamole     Headaches, nausea, upset stomach  . Penicillins Nausea Only    Has patient had a PCN reaction causing immediate rash, facial/tongue/throat swelling, SOB or lightheadedness with hypotension: no Has patient had  a PCN reaction causing severe rash involving mucus membranes or skin necrosis: no Has patient had a PCN reaction that required  hospitalization: no Has patient had a PCN reaction occurring within the last 10 years: no If all of the above answers are "NO", then may proceed with Cephalosporin use.      Current Outpatient Medications:  .  acetaminophen (TYLENOL) 500 MG tablet, Take 1,000 mg by mouth every 8 (eight) hours as needed (pain)., Disp: , Rfl:  .  aspirin 325 MG EC tablet, Take 325 mg by mouth daily., Disp: , Rfl:  .  atorvastatin (LIPITOR) 80 MG tablet, Take 1 tablet (80 mg total) by mouth daily at 6 PM., Disp: 90 tablet, Rfl: 3 .  Coenzyme Q10 (COQ10) 100 MG CAPS, Take 2 capsules by mouth daily., Disp: , Rfl:  .  fluticasone (FLONASE) 50 MCG/ACT nasal spray, Place 2 sprays into both nostrils daily as needed for allergies or rhinitis. , Disp: , Rfl:  .  gentamicin ointment (GARAMYCIN) 0.1 %, Apply 1 application topically 2 (two) times daily. Apply to affected area 3 times a day, Disp: , Rfl:  .  losartan (COZAAR) 25 MG tablet, TAKE 1 TABLET BY MOUTH EVERY DAY, Disp: 90 tablet, Rfl: 4 .  Multiple Vitamin (MULTIVITAMIN) capsule, Take 1 capsule by mouth daily. PM, Disp: , Rfl:  .  omeprazole (PRILOSEC) 40 MG capsule, Take 40 mg by mouth daily., Disp: , Rfl:  .  Probiotic Product (PROBIOTIC DAILY PO), Take 1 capsule by mouth daily. PM, Disp: , Rfl:  .  traMADol (ULTRAM) 50 MG tablet, Take 1-2 tablets (50-100 mg total) by mouth every 4 (four) hours as needed for moderate pain. (Patient not taking: Reported on 02/01/2019), Disp: 60 tablet, Rfl: 0  Review of Systems  Constitutional: Negative for appetite change, chills and fever.  HENT: Positive for hearing loss, nosebleeds and sneezing.   Respiratory: Positive for apnea. Negative for chest tightness, shortness of breath and wheezing.   Cardiovascular: Negative for chest pain and palpitations.  Gastrointestinal: Negative for abdominal pain, nausea and vomiting.    Social History   Tobacco Use  . Smoking status: Former Smoker    Last attempt to quit: 11/25/1970     Years since quitting: 48.2  . Smokeless tobacco: Never Used  Substance Use Topics  . Alcohol use: No      Objective:   BP (!) 146/78 (BP Location: Right Arm, Cuff Size: Large)   Pulse 65   Temp 98.4 F (36.9 C) (Oral)   Resp 16   Ht 5\' 8"  (1.727 m)   Wt 208 lb (94.3 kg)   SpO2 98% Comment: room air  BMI 31.63 kg/m  Vitals:   02/01/19 0920 02/01/19 0923  BP: (!) 148/78 (!) 146/78  Pulse: 65   Resp: 16   Temp: 98.4 F (36.9 C)   TempSrc: Oral   SpO2: 98%   Weight: 208 lb (94.3 kg)   Height: 5\' 8"  (1.727 m)      Physical Exam   General Appearance:    Alert, cooperative, no distress  Eyes:    PERRL, conjunctiva/corneas clear, EOM's intact       Lungs:     Clear to auscultation bilaterally, respirations unlabored  Heart:    Regular rate and rhythm  Neurologic:   Awake, alert, oriented x 3. No apparent focal neurological           defect.           Assessment &  Plan     1. Essential hypertension Well controlled.  Continue current medications.    2. Hyperlipidemia, unspecified hyperlipidemia type He is tolerating lovastatin well with no adverse effects.  Continue current medications.    3. OSA (obstructive sleep apnea) Using CPAP consistently and benefiting from its use.   4. Aortic ectasia, abdominal (HCC) Asymptomatic. Routine u/s 10/2023    Lelon Huh, MD  [facility]

## 2019-02-01 NOTE — Patient Instructions (Signed)
.   Please review the attached list of medications and notify my office if there are any errors.   . Please bring all of your medications to every appointment so we can make sure that our medication list is the same as yours.    The CDC recommends two doses of Shingrix (the shingles vaccine) separated by 2 to 6 months for adults age 70 years and older. I recommend checking with your insurance plan regarding coverage for this vaccine.   

## 2019-07-20 DIAGNOSIS — H2513 Age-related nuclear cataract, bilateral: Secondary | ICD-10-CM | POA: Diagnosis not present

## 2019-07-20 DIAGNOSIS — H25013 Cortical age-related cataract, bilateral: Secondary | ICD-10-CM | POA: Diagnosis not present

## 2019-07-20 DIAGNOSIS — H43813 Vitreous degeneration, bilateral: Secondary | ICD-10-CM | POA: Diagnosis not present

## 2019-07-20 DIAGNOSIS — H524 Presbyopia: Secondary | ICD-10-CM | POA: Diagnosis not present

## 2019-07-30 ENCOUNTER — Other Ambulatory Visit: Payer: Self-pay

## 2019-07-30 DIAGNOSIS — R6889 Other general symptoms and signs: Secondary | ICD-10-CM | POA: Diagnosis not present

## 2019-07-30 DIAGNOSIS — Z20822 Contact with and (suspected) exposure to covid-19: Secondary | ICD-10-CM

## 2019-08-01 LAB — NOVEL CORONAVIRUS, NAA: SARS-CoV-2, NAA: NOT DETECTED

## 2019-08-04 ENCOUNTER — Encounter: Payer: Self-pay | Admitting: Family Medicine

## 2019-08-04 ENCOUNTER — Other Ambulatory Visit: Payer: Self-pay

## 2019-08-04 ENCOUNTER — Ambulatory Visit (INDEPENDENT_AMBULATORY_CARE_PROVIDER_SITE_OTHER): Payer: Medicare Other | Admitting: Family Medicine

## 2019-08-04 VITALS — BP 140/62 | HR 59 | Temp 96.9°F | Resp 14 | Wt 202.4 lb

## 2019-08-04 DIAGNOSIS — Z23 Encounter for immunization: Secondary | ICD-10-CM | POA: Diagnosis not present

## 2019-08-04 DIAGNOSIS — E785 Hyperlipidemia, unspecified: Secondary | ICD-10-CM

## 2019-08-04 DIAGNOSIS — I10 Essential (primary) hypertension: Secondary | ICD-10-CM | POA: Diagnosis not present

## 2019-08-04 NOTE — Progress Notes (Signed)
Patient: James Compton Male    DOB: [birth year]   68 y.o.   MRN: [id number] Visit Date: 08/04/2019  Today's Provider: Lelon Huh, MD   Chief Complaint  Patient presents with  . Hypertension  . Hyperlipidemia   Subjective:     HPI  Hypertension, follow-up:  BP Readings from Last 3 Encounters:  08/04/19 140/62  02/01/19 138/68  12/02/18 130/67    He was last seen for hypertension 6 months ago.  BP at that visit was 138/65. Management changes since that visit include none. He reports excellent compliance with treatment. He is not having side effects.  He is exercising by walking and playing golf. He is adherent to low salt diet.   Outside blood pressures are being checked, systolic usually in the 0000000 and diastolic is the 0000000. He is experiencing none.  Patient denies chest pain, chest pressure/discomfort, claudication, dyspnea, exertional chest pressure/discomfort, fatigue, irregular heart beat, lower extremity edema, near-syncope, orthopnea, palpitations, paroxysmal nocturnal dyspnea, syncope and tachypnea.   Cardiovascular risk factors include advanced age (older than 28 for men, 80 for women), hypertension and male gender.  Use of agents associated with hypertension: NSAIDS.     Weight trend: decreasing steadily Wt Readings from Last 3 Encounters:  08/04/19 202 lb 6.4 oz (91.8 kg)  02/01/19 208 lb (94.3 kg)  11/30/18 206 lb 9.6 oz (93.7 kg)    Current diet: in general, a "healthy" diet    ------------------------------------------------------------------------  Lipid/Cholesterol, Follow-up:   Last seen for this6 months ago.  Management changes since that visit include no changes. . Last Lipid Panel:    Component Value Date/Time   CHOL 105 07/02/2018 0834   TRIG 92 07/02/2018 0834   HDL 42 07/02/2018 0834   CHOLHDL 2.5 07/02/2018 0834   CHOLHDL 4.4 11/04/2016 0351   VLDL 38 11/04/2016 0351   LDLCALC 45 07/02/2018 0834    Risk factors for  vascular disease include hypertension  He reports excellent compliance with treatment. He is not having side effects.  Current symptoms include none and have been unchanged. Weight trend: stable Prior visit with dietician: no Current diet: in general, a "healthy" diet   Current exercise: walking  Wt Readings from Last 3 Encounters:  08/04/19 202 lb 6.4 oz (91.8 kg)  02/01/19 208 lb (94.3 kg)  11/30/18 206 lb 9.6 oz (93.7 kg)    -------------------------------------------------------------------  Allergies  Allergen Reactions  . Dipyridamole     Headaches, nausea, upset stomach  . Penicillins Nausea Only    Has patient had a PCN reaction causing immediate rash, facial/tongue/throat swelling, SOB or lightheadedness with hypotension: no Has patient had a PCN reaction causing severe rash involving mucus membranes or skin necrosis: no Has patient had a PCN reaction that required hospitalization: no Has patient had a PCN reaction occurring within the last 10 years: no If all of the above answers are "NO", then may proceed with Cephalosporin use.      Current Outpatient Medications:  .  acetaminophen (TYLENOL) 500 MG tablet, Take 1,000 mg by mouth every 8 (eight) hours as needed (pain)., Disp: , Rfl:  .  aspirin 325 MG EC tablet, Take 325 mg by mouth daily., Disp: , Rfl:  .  atorvastatin (LIPITOR) 80 MG tablet, Take 1 tablet (80 mg total) by mouth daily at 6 PM., Disp: 90 tablet, Rfl: 3 .  Coenzyme Q10 (COQ10) 100 MG CAPS, Take 2 capsules by mouth daily., Disp: , Rfl:  .  fluticasone (FLONASE) 50 MCG/ACT nasal spray, Place 2 sprays into both nostrils daily as needed for allergies or rhinitis. , Disp: , Rfl:  .  gentamicin ointment (GARAMYCIN) 0.1 %, Apply 1 application topically 2 (two) times daily. Apply to affected area 3 times a day, Disp: , Rfl:  .  losartan (COZAAR) 25 MG tablet, TAKE 1 TABLET BY MOUTH EVERY DAY, Disp: 90 tablet, Rfl: 4 .  Multiple Vitamin (MULTIVITAMIN)  capsule, Take 1 capsule by mouth daily. PM, Disp: , Rfl:  .  omeprazole (PRILOSEC) 40 MG capsule, Take 40 mg by mouth daily., Disp: , Rfl:  .  Probiotic Product (PROBIOTIC DAILY PO), Take 1 capsule by mouth daily. PM, Disp: , Rfl:   Review of Systems  Social History   Tobacco Use  . Smoking status: Former Smoker    Quit date: 11/25/1970    Years since quitting: 48.7  . Smokeless tobacco: Never Used  Substance Use Topics  . Alcohol use: No      Objective:   BP 140/62   Pulse (!) 59   Temp (!) 96.9 F (36.1 C) (Oral)   Resp 14   Wt 202 lb 6.4 oz (91.8 kg)   SpO2 99%   BMI 30.77 kg/m  Vitals:   08/04/19 0949  BP: 140/62  Pulse: (!) 59  Resp: 14  Temp: (!) 96.9 F (36.1 C)  TempSrc: Oral  SpO2: 99%  Weight: 202 lb 6.4 oz (91.8 kg)  Body mass index is 30.77 kg/m.   Physical Exam   General Appearance:    Alert, cooperative, no distress  Eyes:    PERRL, conjunctiva/corneas clear, EOM's intact       Lungs:     Clear to auscultation bilaterally, respirations unlabored  Heart:    Bradycardic. Normal rhythm. No murmurs, rubs, or gallops.   MS:   All extremities are intact.   Neurologic:   Awake, alert, oriented x 3. No apparent focal neurological           defect.          Assessment & Plan    1. Essential hypertension Well controlled.  Continue current medications.   - Comprehensive metabolic panel  2. Hyperlipidemia, unspecified hyperlipidemia type He is tolerating atorvastatin well with no adverse effects.   - Comprehensive metabolic panel - Lipid panel  3. Need for influenza vaccination  - Flu Vaccine QUAD High Dose(Fluad)   The entirety of the information documented in the History of Present Illness, Review of Systems and Physical Exam were personally obtained by me. Portions of this information were initially documented by Minette Headland, CMA and reviewed by me for thoroughness and accuracy.      Lelon Huh, MD  [facility]

## 2019-08-04 NOTE — Patient Instructions (Signed)
.   Please review the attached list of medications and notify my office if there are any errors.   . Please bring all of your medications to every appointment so we can make sure that our medication list is the same as yours.   . It is especially important to get the annual flu vaccine this year. If you haven't had it already, please go to your pharmacy or call the office as soon as possible to schedule you flu shot.  

## 2019-08-05 LAB — COMPREHENSIVE METABOLIC PANEL
ALT: 12 IU/L (ref 0–44)
AST: 22 IU/L (ref 0–40)
Albumin/Globulin Ratio: 2 (ref 1.2–2.2)
Albumin: 4.5 g/dL (ref 3.8–4.8)
Alkaline Phosphatase: 170 IU/L — ABNORMAL HIGH (ref 39–117)
BUN/Creatinine Ratio: 13 (ref 10–24)
BUN: 11 mg/dL (ref 8–27)
Bilirubin Total: 1.5 mg/dL — ABNORMAL HIGH (ref 0.0–1.2)
CO2: 22 mmol/L (ref 20–29)
Calcium: 9.1 mg/dL (ref 8.6–10.2)
Chloride: 105 mmol/L (ref 96–106)
Creatinine, Ser: 0.87 mg/dL (ref 0.76–1.27)
GFR calc Af Amer: 101 mL/min/{1.73_m2} (ref >59)
GFR calc non Af Amer: 87 mL/min/{1.73_m2} (ref >59)
Globulin, Total: 2.3 g/dL (ref 1.5–4.5)
Glucose: 87 mg/dL (ref 65–99)
Potassium: 4 mmol/L (ref 3.5–5.2)
Sodium: 141 mmol/L (ref 134–144)
Total Protein: 6.8 g/dL (ref 6.0–8.5)

## 2019-08-05 LAB — LIPID PANEL
Chol/HDL Ratio: 2.3 ratio (ref 0.0–5.0)
Cholesterol, Total: 95 mg/dL — ABNORMAL LOW (ref 100–199)
HDL: 42 mg/dL (ref >39)
LDL Chol Calc (NIH): 36 mg/dL (ref 0–99)
Triglycerides: 88 mg/dL (ref 0–149)
VLDL Cholesterol Cal: 17 mg/dL (ref 5–40)

## 2019-08-06 ENCOUNTER — Telehealth: Payer: Self-pay

## 2019-08-06 NOTE — Telephone Encounter (Signed)
-----   Message from [name], MD sent at 08/05/2019  9:06 AM EDT ----- Can you see if [facility] can add FRACTIONATED alkaline phosphatase (a.k.a. alkaline phosphatase isoenzymes)

## 2019-08-06 NOTE — Telephone Encounter (Signed)
Contacted [facility] and had lab added. KW

## 2019-08-12 LAB — ALKALINE PHOSPHATASE, ISOENZYMES
Alkaline Phosphatase: 172 IU/L — ABNORMAL HIGH (ref 39–117)
BONE FRACTION: 46 % (ref 12–68)
INTESTINAL FRAC.: 0 % (ref 0–18)
LIVER FRACTION: 54 % (ref 13–88)

## 2019-08-12 LAB — SPECIMEN STATUS REPORT

## 2019-08-17 ENCOUNTER — Other Ambulatory Visit: Payer: Self-pay | Admitting: Family Medicine

## 2019-09-29 ENCOUNTER — Telehealth: Payer: Self-pay

## 2019-09-29 DIAGNOSIS — Z8673 Personal history of transient ischemic attack (TIA), and cerebral infarction without residual deficits: Secondary | ICD-10-CM

## 2019-09-29 NOTE — Telephone Encounter (Signed)
Patient is calling office requesting a referral to neurology. Patient states that he will be getting a DOT PE soon and reports that he has a history of TIA and would need to see neurologist prior to PE. Patient states that if you have any questions he can be reached at [phone]. KW

## 2019-10-27 DIAGNOSIS — Z8673 Personal history of transient ischemic attack (TIA), and cerebral infarction without residual deficits: Secondary | ICD-10-CM | POA: Diagnosis not present

## 2019-11-09 ENCOUNTER — Other Ambulatory Visit: Payer: Self-pay | Admitting: Family Medicine

## 2019-11-16 NOTE — Progress Notes (Signed)
Subjective:   James Compton is a 68 y.o. male who presents for Medicare Annual/Subsequent preventive examination.    This visit is being conducted through telemedicine due to the COVID-19 pandemic. This patient has given me verbal consent via doximity to conduct this visit, patient states they are participating from their home address. Some vital signs may be absent or patient reported.    Patient identification: identified by name, DOB, and current address  Review of Systems:  N/A  Cardiac Risk Factors include: advanced age (>43men, >16 women);dyslipidemia;hypertension;male gender     Objective:    Vitals: There were no vitals taken for this visit.  There is no height or weight on file to calculate BMI. Unable to obtain vitals due to visit being conducted via telephonically.   Advanced Directives 11/22/2019 11/30/2018 11/20/2018 11/11/2018 10/31/2017 04/05/2017 04/02/2017  Does Patient Have a Medical Advance Directive? Yes Yes Yes Yes Yes Yes Yes  Type of Paramedic of [place];Living will Living will [place];Living will [place];Living will [place];Living will [place];Living will [place];Living will  Does patient want to make changes to medical advance directive? - No - Patient declined - No - Patient declined - No - Patient declined -  Copy of [place] in Chart? Yes - validated most recent copy scanned in chart (See row information) No - copy requested Yes - validated most recent copy scanned in chart (See row information) Yes - validated most recent copy scanned in chart (See row information) Yes Yes Yes  Would patient like information on creating a medical advance directive? - - - - - - -  Pre-existing out of facility DNR order (yellow form or pink MOST form) - - - - (No Data) - -    Tobacco Social History   Tobacco Use  Smoking Status  Former Smoker  . Quit date: 11/25/1970  . Years since quitting: 49.0  Smokeless Tobacco Never Used     Counseling given: Not Answered   Clinical Intake:  Pre-visit preparation completed: Yes  Pain : No/denies pain Pain Score: 0-No pain     Nutritional Risks: None Diabetes: No  How often do you need to have someone help you when you read instructions, pamphlets, or other written materials from your doctor or pharmacy?: 1 - Never  Interpreter Needed?: No  Information entered by :: [facility], LPN  Past Medical History:  Diagnosis Date  . Arthritis    Osteo - knees  . Complication of anesthesia    bowels "feel asleep" after knee surgery  . GERD (gastroesophageal reflux disease)    occas  . Hypertension   . Pneumonia   . PONV (postoperative nausea and vomiting)   . Sleep apnea   . Status post arthroscopy 04/11/2017   Right Knee May 2018 Dr. Marry Guan  . TIA (transient ischemic attack) 11/03/2016  . Vertigo 01/2011   1 time event, pt had drank several cups of coffee that day.  . Wears hearing aid    bilateral  . Wears partial dentures    upper   Past Surgical History:  Procedure Laterality Date  . CHOLECYSTECTOMY  2007  . COLONOSCOPY N/A 04/14/2015   Procedure: COLONOSCOPY;  Surgeon: Lucilla Lame, MD;  Location: [facility];  Service: Gastroenterology;  Laterality: N/A;  . KNEE ARTHROPLASTY Right 11/30/2018   Procedure: COMPUTER ASSISTED TOTAL KNEE ARTHROPLASTY;  Surgeon: Dereck Leep, MD;  Location:  [facility];  Service: Orthopedics;  Laterality: Right;  . KNEE ARTHROSCOPY Right 2007  . KNEE ARTHROSCOPY WITH MEDIAL MENISECTOMY  04/02/2017   Procedure: KNEE ARTHROSCOPY WITH MEDIAL MENISECTOMY CHONDRAPLASTY, SYNOVECTOMY;  Surgeon: Dereck Leep, MD;  Location: [facility];  Service: Orthopedics;;  . POLYPECTOMY  04/14/2015   Procedure: POLYPECTOMY INTESTINAL;  Surgeon: Lucilla Lame, MD;  Location: [facility];  Service: Gastroenterology;;   Family History    Problem Relation Age of Onset  . Alzheimer's disease Mother   . Diabetes Father        pre diabetic  . Healthy Sister   . Healthy Brother   . Healthy Daughter   . Healthy Son   . Healthy Daughter    Social History   Socioeconomic History  . Marital status: Married    Spouse name: Not on file  . Number of children: 3  . Years of education: Not on file  . Highest education level: Some college, no degree  Occupational History  . Not on file  Tobacco Use  . Smoking status: Former Smoker    Quit date: 11/25/1970    Years since quitting: 49.0  . Smokeless tobacco: Never Used  Substance and Sexual Activity  . Alcohol use: No  . Drug use: No  . Sexual activity: Not on file  Other Topics Concern  . Not on file  Social History Narrative  . Not on file   Social Determinants of Health   Financial Resource Strain: Low Risk   . Difficulty of Paying Living Expenses: Not hard at all  Food Insecurity: No Food Insecurity  . Worried About Charity fundraiser in the Last Year: Never true  . Ran Out of Food in the Last Year: Never true  Transportation Needs: No Transportation Needs  . Lack of Transportation (Medical): No  . Lack of Transportation (Non-Medical): No  Physical Activity: Inactive  . Days of Exercise per Week: 0 days  . Minutes of Exercise per Session: 0 min  Stress: No Stress Concern Present  . Feeling of Stress : Not at all  Social Connections: Not Isolated  . Frequency of Communication with Friends and Family: More than three times a week  . Frequency of Social Gatherings with Friends and Family: Three times a week  . Attends Religious Services: More than 4 times per year  . Active Member of Clubs or Organizations: Yes  . Attends Archivist Meetings: More than 4 times per year  . Marital Status: Married    Outpatient Encounter Medications as of 11/22/2019  Medication Sig  . acetaminophen (TYLENOL) 500 MG tablet Take 1,000 mg by mouth every 8 (eight)  hours as needed (pain).  Marland Kitchen aspirin 325 MG EC tablet Take 325 mg by mouth daily.  Marland Kitchen atorvastatin (LIPITOR) 80 MG tablet TAKE 1 TABLET (80 MG TOTAL) BY MOUTH DAILY AT 6 PM.  . Coenzyme Q10 (COQ10) 100 MG CAPS Take 2 capsules by mouth daily.  . fluticasone (FLONASE) 50 MCG/ACT nasal spray Place 2 sprays into both nostrils daily as needed for allergies or rhinitis.   Marland Kitchen gentamicin ointment (GARAMYCIN) 0.1 % Apply 1 application topically 2 (two) times daily. Apply to affected area 3 times a day  . losartan (COZAAR) 25 MG tablet TAKE 1 TABLET BY MOUTH EVERY DAY  . Multiple Vitamin (MULTIVITAMIN) capsule Take 1 capsule by mouth daily. PM  . omeprazole (PRILOSEC) 40 MG capsule Take 40 mg by mouth daily.  . Probiotic Product (PROBIOTIC DAILY  PO) Take 1 capsule by mouth daily. PM   No facility-administered encounter medications on file as of 11/22/2019.    Activities of Daily Living In your present state of health, do you have any difficulty performing the following activities: 11/22/2019 11/30/2018  Hearing? N Y  Comment Wears bilateral hearing aids. -  Vision? N N  Difficulty concentrating or making decisions? N N  Walking or climbing stairs? N Y  Dressing or bathing? N N  Doing errands, shopping? N N  Preparing Food and eating ? N -  Using the Toilet? N -  In the past six months, have you accidently leaked urine? N -  Do you have problems with loss of bowel control? N -  Managing your Medications? N -  Managing your Finances? N -  Housekeeping or managing your Housekeeping? N -  Some recent data might be hidden    Patient Care Team: [name], MD as PCP - General (Family Medicine) Ubaldo Glassing, Javier Docker, MD as Consulting Physician (Cardiology) Hooten, Laurice Record, MD as Consulting Physician (Orthopedic Surgery) Beverly Gust, MD as Consulting Physician (Otolaryngology) Jannifer Franklin, NP as Nurse Practitioner (Neurology)   Assessment:   This is a routine wellness examination for  [place].  Exercise Activities and Dietary recommendations Current Exercise Habits: Home exercise routine, Type of exercise: Other - see comments(Plays golf 2-3 xs a week.), Time (Minutes): > 60, Frequency (Times/Week): 2(to 3 times a week), Weekly Exercise (Minutes/Week): 0, Intensity: Mild, Exercise limited by: None identified  Goals    . DIET - REDUCE CALORIE INTAKE     Recommend to cut back on heavy carbohydrates to help aid in weight loss.        Fall Risk: Fall Risk  11/22/2019 11/20/2018 11/03/2018 10/15/2018 10/31/2017  Falls in the past year? 0 0 0 0 No  Comment - - - Emmi Telephone Survey: data to providers prior to load -  Number falls in past yr: 0 0 - - -  Injury with Fall? 0 0 - - -    FALL RISK PREVENTION PERTAINING TO THE HOME:  Any stairs in or around the home? Yes  If so, are there any without handrails? No   Home free of loose throw rugs in walkways, pet beds, electrical cords, etc? Yes  Adequate lighting in your home to reduce risk of falls? Yes   ASSISTIVE DEVICES UTILIZED TO PREVENT FALLS:  Life alert? No  Use of a cane, walker or w/c? No  Grab bars in the bathroom? No  Shower chair or bench in shower? No  Elevated toilet seat or a handicapped toilet? No   TIMED UP AND GO:  Was the test performed? No .    Depression Screen PHQ 2/9 Scores 11/22/2019 11/22/2019 11/20/2018 11/03/2018  PHQ - 2 Score 0 0 0 0    Cognitive Function: Declined today.      6CIT Screen 10/31/2017  What Year? 0 points  What month? 0 points  What time? 0 points  Count back from 20 0 points  Months in reverse 2 points  Repeat phrase 0 points  Total Score 2    Immunization History  Administered Date(s) Administered  . Fluad Quad(high Dose 65+) 08/04/2019  . Influenza, High Dose Seasonal PF 08/26/2017, 09/24/2018  . Influenza-Unspecified 09/25/2016  . Pneumococcal Conjugate-13 01/19/2014  . Pneumococcal Polysaccharide-23 03/24/2015  . Td 07/04/2004, 09/25/2010  .  Tdap 09/25/2010  . Zoster 09/17/2011    Qualifies for Shingles Vaccine? Yes  Zostavax  completed 09/17/11. Due for Shingrix. Pt has been advised to call insurance company to determine out of pocket expense. Advised may also receive vaccine at local pharmacy or Health Dept. Verbalized acceptance and understanding.  Tdap: Up to date  Flu Vaccine: Up to date  Pneumococcal Vaccine: Completed series  Screening Tests Health Maintenance  Topic Date Due  . TETANUS/TDAP  09/25/2020  . COLONOSCOPY  04/13/2025  . INFLUENZA VACCINE  Completed  . Hepatitis C Screening  Completed  . PNA vac Low Risk Adult  Completed   Cancer Screenings:  Colorectal Screening: Completed 03/2015. Repeat every 10 years.  Lung Cancer Screening: (Low Dose CT Chest recommended if Age 16-80 years, 30 pack-year currently smoking OR have quit w/in 15years.) does not qualify.   Additional Screening:  Hepatitis C Screening: Up to date  Vision Screening: Recommended annual ophthalmology exams for early detection of glaucoma and other disorders of the eye.  Dental Screening: Recommended annual dental exams for proper oral hygiene  Community Resource Referral:  CRR required this visit?  No        Plan:  I have personally reviewed and addressed the Medicare Annual Wellness questionnaire and have noted the following in the patient's chart:  A. Medical and social history B. Use of alcohol, tobacco or illicit drugs  C. Current medications and supplements D. Functional ability and status E.  Nutritional status F.  Physical activity G. Advance directives H. List of other physicians I.  Hospitalizations, surgeries, and ER visits in previous 12 months J.  [place] such as hearing and vision if needed, cognitive and depression L. Referrals and appointments   In addition, I have reviewed and discussed with patient certain preventive protocols, quality metrics, and best practice recommendations. A written  personalized care plan for preventive services as well as general preventive health recommendations were provided to patient.   Glendora Score, LPN  [id number] Nurse Health Advisor   Nurse Notes: None.

## 2019-11-22 ENCOUNTER — Ambulatory Visit (INDEPENDENT_AMBULATORY_CARE_PROVIDER_SITE_OTHER): Payer: Medicare Other

## 2019-11-22 ENCOUNTER — Other Ambulatory Visit: Payer: Self-pay

## 2019-11-22 DIAGNOSIS — Z0000 Encounter for general adult medical examination without abnormal findings: Secondary | ICD-10-CM | POA: Diagnosis not present

## 2019-11-22 NOTE — Patient Instructions (Signed)
James Compton , Thank you for taking time to come for your Medicare Wellness Visit. I appreciate your ongoing commitment to your health goals. Please review the following plan we discussed and let me know if I can assist you in the future.   Screening recommendations/referrals: Colonoscopy: Up to date, due 03/2025 Recommended yearly ophthalmology/optometry visit for glaucoma screening and checkup Recommended yearly dental visit for hygiene and checkup  Vaccinations: Influenza vaccine: Up to date Pneumococcal vaccine: Completed series Tdap vaccine: Up to date, due 09/2020 Shingles vaccine: Pt declines today.     Advanced directives: Currently on file.   Conditions/risks identified: Continue to cut back on heavy carbohydrates as tolerated.   Next appointment: 01/11/20 @ 2:00 PM with Dr Caryn Section. Declined scheduling an AWV for 2021 at this time.   Preventive Care 70 Years and Older, Male Preventive care refers to lifestyle choices and visits with your health care provider that can promote health and wellness. What does preventive care include?  A yearly physical exam. This is also called an annual well check.  Dental exams once or twice a year.  Routine eye exams. Ask your health care provider how often you should have your eyes checked.  Personal lifestyle choices, including:  Daily care of your teeth and gums.  Regular physical activity.  Eating a healthy diet.  Avoiding tobacco and drug use.  Limiting alcohol use.  Practicing safe sex.  Taking low doses of aspirin every day.  Taking vitamin and mineral supplements as recommended by your health care provider. What happens during an annual well check? The services and screenings done by your health care provider during your annual well check will depend on your age, overall health, lifestyle risk factors, and family history of disease. Counseling  Your health care provider may ask you questions about your:  Alcohol  use.  Tobacco use.  Drug use.  Emotional well-being.  Home and relationship well-being.  Sexual activity.  Eating habits.  History of falls.  Memory and ability to understand (cognition).  Work and work Statistician. Screening  You may have the following tests or measurements:  Height, weight, and BMI.  Blood pressure.  Lipid and cholesterol levels. These may be checked every 5 years, or more frequently if you are over 65 years old.  Skin check.  Lung cancer screening. You may have this screening every year starting at age 18 if you have a 30-pack-year history of smoking and currently smoke or have quit within the past 15 years.  Fecal occult blood test (FOBT) of the stool. You may have this test every year starting at age 75.  Flexible sigmoidoscopy or colonoscopy. You may have a sigmoidoscopy every 5 years or a colonoscopy every 10 years starting at age 21.  Prostate cancer screening. Recommendations will vary depending on your family history and other risks.  Hepatitis C blood test.  Hepatitis B blood test.  Sexually transmitted disease (STD) testing.  Diabetes screening. This is done by checking your blood sugar (glucose) after you have not eaten for a while (fasting). You may have this done every 1-3 years.  Abdominal aortic aneurysm (AAA) screening. You may need this if you are a current or former smoker.  Osteoporosis. You may be screened starting at age 51 if you are at high risk. Talk with your health care provider about your test results, treatment options, and if necessary, the need for more tests. Vaccines  Your health care provider may recommend certain vaccines, such as:  Influenza  vaccine. This is recommended every year.  Tetanus, diphtheria, and acellular pertussis (Tdap, Td) vaccine. You may need a Td booster every 10 years.  Zoster vaccine. You may need this after age 64.  Pneumococcal 13-valent conjugate (PCV13) vaccine. One dose is  recommended after age 80.  Pneumococcal polysaccharide (PPSV23) vaccine. One dose is recommended after age 63. Talk to your health care provider about which screenings and vaccines you need and how often you need them. This information is not intended to replace advice given to you by your health care provider. Make sure you discuss any questions you have with your health care provider. Document Released: 12/08/2015 Document Revised: 07/31/2016 Document Reviewed: 09/12/2015 Elsevier Interactive Patient Education  2017 [place] Prevention in the Home Falls can cause injuries. They can happen to people of all ages. There are many things you can do to make your home safe and to help prevent falls. What can I do on the outside of my home?  Regularly fix the edges of walkways and driveways and fix any cracks.  Remove anything that might make you trip as you walk through a door, such as a raised step or threshold.  Trim any bushes or trees on the path to your home.  Use bright outdoor lighting.  Clear any walking paths of anything that might make someone trip, such as rocks or tools.  Regularly check to see if handrails are loose or broken. Make sure that both sides of any steps have handrails.  Any raised decks and porches should have guardrails on the edges.  Have any leaves, snow, or ice cleared regularly.  Use sand or salt on walking paths during winter.  Clean up any spills in your garage right away. This includes oil or grease spills. What can I do in the bathroom?  Use night lights.  Install grab bars by the toilet and in the tub and shower. Do not use towel bars as grab bars.  Use non-skid mats or decals in the tub or shower.  If you need to sit down in the shower, use a plastic, non-slip stool.  Keep the floor dry. Clean up any water that spills on the floor as soon as it happens.  Remove soap buildup in the tub or shower regularly.  Attach bath mats  securely with double-sided non-slip rug tape.  Do not have throw rugs and other things on the floor that can make you trip. What can I do in the bedroom?  Use night lights.  Make sure that you have a light by your bed that is easy to reach.  Do not use any sheets or blankets that are too big for your bed. They should not hang down onto the floor.  Have a firm chair that has side arms. You can use this for support while you get dressed.  Do not have throw rugs and other things on the floor that can make you trip. What can I do in the kitchen?  Clean up any spills right away.  Avoid walking on wet floors.  Keep items that you use a lot in easy-to-reach places.  If you need to reach something above you, use a strong step stool that has a grab bar.  Keep electrical cords out of the way.  Do not use floor polish or wax that makes floors slippery. If you must use wax, use non-skid floor wax.  Do not have throw rugs and other things on the floor that can  make you trip. What can I do with my stairs?  Do not leave any items on the stairs.  Make sure that there are handrails on both sides of the stairs and use them. Fix handrails that are broken or loose. Make sure that handrails are as long as the stairways.  Check any carpeting to make sure that it is firmly attached to the stairs. Fix any carpet that is loose or worn.  Avoid having throw rugs at the top or bottom of the stairs. If you do have throw rugs, attach them to the floor with carpet tape.  Make sure that you have a light switch at the top of the stairs and the bottom of the stairs. If you do not have them, ask someone to add them for you. What else can I do to help prevent falls?  Wear shoes that:  Do not have high heels.  Have rubber bottoms.  Are comfortable and fit you well.  Are closed at the toe. Do not wear sandals.  If you use a stepladder:  Make sure that it is fully opened. Do not climb a closed  stepladder.  Make sure that both sides of the stepladder are locked into place.  Ask someone to hold it for you, if possible.  Clearly mark and make sure that you can see:  Any grab bars or handrails.  First and last steps.  Where the edge of each step is.  Use tools that help you move around (mobility aids) if they are needed. These include:  Canes.  Walkers.  Scooters.  Crutches.  Turn on the lights when you go into a dark area. Replace any light bulbs as soon as they burn out.  Set up your furniture so you have a clear path. Avoid moving your furniture around.  If any of your floors are uneven, fix them.  If there are any pets around you, be aware of where they are.  Review your medicines with your doctor. Some medicines can make you feel dizzy. This can increase your chance of falling. Ask your doctor what other things that you can do to help prevent falls. This information is not intended to replace advice given to you by your health care provider. Make sure you discuss any questions you have with your health care provider. Document Released: 09/07/2009 Document Revised: 04/18/2016 Document Reviewed: 12/16/2014 Elsevier Interactive Patient Education  2017 [employer].

## 2020-01-11 ENCOUNTER — Other Ambulatory Visit: Payer: Self-pay

## 2020-01-11 ENCOUNTER — Ambulatory Visit (INDEPENDENT_AMBULATORY_CARE_PROVIDER_SITE_OTHER): Payer: Medicare Other | Admitting: Family Medicine

## 2020-01-11 ENCOUNTER — Encounter: Payer: Self-pay | Admitting: Family Medicine

## 2020-01-11 VITALS — BP 160/80 | HR 68 | Temp 97.7°F | Resp 16 | Wt 207.0 lb

## 2020-01-11 DIAGNOSIS — I77811 Abdominal aortic ectasia: Secondary | ICD-10-CM

## 2020-01-11 DIAGNOSIS — E785 Hyperlipidemia, unspecified: Secondary | ICD-10-CM

## 2020-01-11 DIAGNOSIS — I10 Essential (primary) hypertension: Secondary | ICD-10-CM

## 2020-01-11 DIAGNOSIS — Z125 Encounter for screening for malignant neoplasm of prostate: Secondary | ICD-10-CM | POA: Diagnosis not present

## 2020-01-11 NOTE — Progress Notes (Signed)
Patient: James Compton Male    DOB: 12/27/1950   69 y.o.   MRN: [id number] Visit Date: 01/11/2020  Today's Provider: Lelon Huh, MD   Chief Complaint  Patient presents with  . Hypertension  . Hyperlipidemia   Subjective:     HPI  Hypertension, follow-up:  BP Readings from Last 3 Encounters:  01/11/20 (!) 160/80  08/04/19 140/62  02/01/19 138/68    He was last seen for hypertension 5 months ago.  BP at that visit was 140/62. Management since that visit includes no changes. He reports good compliance with treatment. He is not having side effects.  He is exercising. He is not adherent to low salt diet.   Outside blood pressures average 130/70. He is experiencing none.  Patient denies chest pain, chest pressure/discomfort, claudication, dyspnea, exertional chest pressure/discomfort, fatigue, irregular heart beat, lower extremity edema, near-syncope, orthopnea, palpitations, paroxysmal nocturnal dyspnea, syncope and tachypnea.   Cardiovascular risk factors include advanced age (older than 51 for men, 81 for women), hypertension and male gender.  Use of agents associated with hypertension: NSAIDS.     Weight trend: fluctuating a bit Wt Readings from Last 3 Encounters:  01/11/20 207 lb (93.9 kg)  08/04/19 202 lb 6.4 oz (91.8 kg)  02/01/19 208 lb (94.3 kg)    Current diet: well balanced  ------------------------------------------------------------------------  Lipid/Cholesterol, Follow-up:   Last seen for this 5 months ago.  Management changes since that visit include none. . Last Lipid Panel:    Component Value Date/Time   CHOL 95 (L) 08/04/2019 1109   TRIG 88 08/04/2019 1109   HDL 42 08/04/2019 1109   CHOLHDL 2.3 08/04/2019 1109   CHOLHDL 4.4 11/04/2016 0351   VLDL 38 11/04/2016 0351   LDLCALC 36 08/04/2019 1109    Risk factors for vascular disease include hypercholesterolemia and hypertension  He reports good compliance with treatment. He is  not having side effects.  Current symptoms include none and have been stable. Weight trend: fluctuating a bit Prior visit with dietician: no Current diet: well balanced Current exercise: walking  Wt Readings from Last 3 Encounters:  01/11/20 207 lb (93.9 kg)  08/04/19 202 lb 6.4 oz (91.8 kg)  02/01/19 208 lb (94.3 kg)    -------------------------------------------------------------------  Follow up for Obstructive sleep Apnea:  The patient was last seen for this 11 months ago. Changes made at last visit include none; patient was compliant with CPAP useage.  He reports good compliance with treatment. He feels that condition is Improved. He is not having side effects.   ------------------------------------------------------------------------------------ Patient states the the last 6 months sometimes when he gets up in the morning he starts gagging phlelm and mucous and sometimes after eating.   Allergies  Allergen Reactions  . Dipyridamole     Headaches, nausea, upset stomach  . Penicillins Nausea Only    Has patient had a PCN reaction causing immediate rash, facial/tongue/throat swelling, SOB or lightheadedness with hypotension: no Has patient had a PCN reaction causing severe rash involving mucus membranes or skin necrosis: no Has patient had a PCN reaction that required hospitalization: no Has patient had a PCN reaction occurring within the last 10 years: no If all of the above answers are "NO", then may proceed with Cephalosporin use.      Current Outpatient Medications:  .  acetaminophen (TYLENOL) 500 MG tablet, Take 1,000 mg by mouth every 8 (eight) hours as needed (pain)., Disp: , Rfl:  .  aspirin 325  MG EC tablet, Take 325 mg by mouth daily., Disp: , Rfl:  .  atorvastatin (LIPITOR) 80 MG tablet, TAKE 1 TABLET (80 MG TOTAL) BY MOUTH DAILY AT 6 PM., Disp: 90 tablet, Rfl: 0 .  Coenzyme Q10 (COQ10) 100 MG CAPS, Take 2 capsules by mouth daily., Disp: , Rfl:  .   fluticasone (FLONASE) 50 MCG/ACT nasal spray, Place 2 sprays into both nostrils daily as needed for allergies or rhinitis. , Disp: , Rfl:  .  gentamicin ointment (GARAMYCIN) 0.1 %, Apply 1 application topically 2 (two) times daily. Apply to affected area 3 times a day, Disp: , Rfl:  .  losartan (COZAAR) 25 MG tablet, TAKE 1 TABLET BY MOUTH EVERY DAY, Disp: 90 tablet, Rfl: 4 .  Multiple Vitamin (MULTIVITAMIN) capsule, Take 1 capsule by mouth daily. PM, Disp: , Rfl:  .  omeprazole (PRILOSEC) 40 MG capsule, Take 40 mg by mouth daily., Disp: , Rfl:  .  Probiotic Product (PROBIOTIC DAILY PO), Take 1 capsule by mouth daily. PM, Disp: , Rfl:   Review of Systems  Constitutional: Negative for appetite change, chills and fever.  HENT: Positive for hearing loss.   Respiratory: Positive for apnea and choking. Negative for chest tightness, shortness of breath and wheezing.   Cardiovascular: Negative for chest pain and palpitations.  Gastrointestinal: Negative for abdominal pain, nausea and vomiting.    Social History   Tobacco Use  . Smoking status: Former Smoker    Quit date: 11/25/1970    Years since quitting: 49.1  . Smokeless tobacco: Never Used  Substance Use Topics  . Alcohol use: No      Objective:   BP (!) 160/80 (BP Location: Right Arm, Cuff Size: Large)   Pulse 68   Temp 97.7 F (36.5 C) (Temporal)   Resp 16   Wt 207 lb (93.9 kg)   SpO2 97% Comment: room air  BMI 31.47 kg/m  Vitals:   01/11/20 1403 01/11/20 1406  BP: (!) 152/78 (!) 160/80  Pulse: 68   Resp: 16   Temp: 97.7 F (36.5 C)   TempSrc: Temporal   SpO2: 97%   Weight: 207 lb (93.9 kg)   Body mass index is 31.47 kg/m.   Physical Exam   General: Appearance:    Overweight male in no acute distress  Eyes:    PERRL, conjunctiva/corneas clear, EOM's intact       Lungs:     Clear to auscultation bilaterally, respirations unlabored  Heart:    Normal heart rate. Normal rhythm. No murmurs, rubs, or gallops.   MS:    All extremities are intact.   Neurologic:   Awake, alert, oriented x 3. No apparent focal neurological           defect.           Assessment & Plan    1. Essential hypertension Doing well current medications. He reports home blood pressures much better than measurement in office today. Continue current medications.   - Comprehensive metabolic panel  2. Hyperlipidemia, unspecified hyperlipidemia type He is tolerating atorvastatin well with no adverse effects.   - Comprehensive metabolic panel - Lipid panel  3. Prostate cancer screening  - PSA  4. Aortic ectasia, abdominal (HCC) Asymptomatic. Repeat u/s 2024   The entirety of the information documented in the History of Present Illness, Review of Systems and Physical Exam were personally obtained by me. Portions of this information were initially documented by Meyer Cory, CMA and reviewed by me  for thoroughness and accuracy.      Lelon Huh, MD  [facility]

## 2020-01-11 NOTE — Patient Instructions (Addendum)
.   Please review the attached list of medications and notify my office if there are any errors.   . Please bring all of your medications to every appointment so we can make sure that our medication list is the same as yours.   You can try Voltaren Gel to help with joint pain in your thumb    Try taking OTC guaifenesin  (Mucines) and Zyrtec at night before bed to keep phlegm in mucous from building up  . Please go to the lab draw station in [address] on the second floor of [facility]  when you are fasting for 8 hours. Normal hours are 8:00am to 12:30pm and 1:30pm to 4:00pm Monday through Friday

## 2020-01-14 DIAGNOSIS — Z125 Encounter for screening for malignant neoplasm of prostate: Secondary | ICD-10-CM | POA: Diagnosis not present

## 2020-01-14 DIAGNOSIS — I10 Essential (primary) hypertension: Secondary | ICD-10-CM | POA: Diagnosis not present

## 2020-01-14 DIAGNOSIS — E785 Hyperlipidemia, unspecified: Secondary | ICD-10-CM | POA: Diagnosis not present

## 2020-01-15 LAB — COMPREHENSIVE METABOLIC PANEL
ALT: 13 IU/L (ref 0–44)
AST: 24 IU/L (ref 0–40)
Albumin/Globulin Ratio: 2.2 (ref 1.2–2.2)
Albumin: 4.6 g/dL (ref 3.7–4.7)
Alkaline Phosphatase: 178 IU/L — ABNORMAL HIGH (ref 39–117)
BUN/Creatinine Ratio: 16 (ref 10–24)
BUN: 14 mg/dL (ref 8–27)
Bilirubin Total: 1.1 mg/dL (ref 0.0–1.2)
CO2: 25 mmol/L (ref 20–29)
Calcium: 9 mg/dL (ref 8.6–10.2)
Chloride: 103 mmol/L (ref 96–106)
Creatinine, Ser: 0.85 mg/dL (ref 0.76–1.27)
GFR calc Af Amer: 101 mL/min/{1.73_m2} (ref >59)
GFR calc non Af Amer: 88 mL/min/{1.73_m2} (ref >59)
Globulin, Total: 2.1 g/dL (ref 1.5–4.5)
Glucose: 94 mg/dL (ref 65–99)
Potassium: 4 mmol/L (ref 3.5–5.2)
Sodium: 141 mmol/L (ref 134–144)
Total Protein: 6.7 g/dL (ref 6.0–8.5)

## 2020-01-15 LAB — PSA: Prostate Specific Ag, Serum: 1.2 ng/mL (ref 0.0–4.0)

## 2020-01-15 LAB — LIPID PANEL
Chol/HDL Ratio: 2.5 ratio (ref 0.0–5.0)
Cholesterol, Total: 95 mg/dL — ABNORMAL LOW (ref 100–199)
HDL: 38 mg/dL — ABNORMAL LOW (ref >39)
LDL Chol Calc (NIH): 36 mg/dL (ref 0–99)
Triglycerides: 112 mg/dL (ref 0–149)
VLDL Cholesterol Cal: 21 mg/dL (ref 5–40)

## 2020-01-21 ENCOUNTER — Ambulatory Visit: Payer: Medicare Other | Attending: Internal Medicine

## 2020-01-21 DIAGNOSIS — Z23 Encounter for immunization: Secondary | ICD-10-CM | POA: Insufficient documentation

## 2020-01-21 NOTE — Progress Notes (Signed)
   Covid-19 Vaccination Clinic  Name:  MALIK PRASSE    MRN: [id number] DOB: 01-May-1951  01/21/2020  Mr. Alix was observed post Covid-19 immunization for 15 minutes without incidence. He was provided with Vaccine Information Sheet and instruction to access the V-Safe system.   Mr. Mcevoy was instructed to call 911 with any severe reactions post vaccine: Marland Kitchen Difficulty breathing  . Swelling of your face and throat  . A fast heartbeat  . A bad rash all over your body  . Dizziness and weakness    Immunizations Administered    Name Date Dose VIS Date Route   Pfizer COVID-19 Vaccine 01/21/2020 10:15 AM 0.3 mL 11/05/2019 Intramuscular   Manufacturer: [place]   Lot: [id number]   [place]: [id number]

## 2020-02-01 ENCOUNTER — Other Ambulatory Visit: Payer: Self-pay | Admitting: Family Medicine

## 2020-02-01 NOTE — Telephone Encounter (Signed)
Requested Prescriptions  Pending Prescriptions Disp Refills  . atorvastatin (LIPITOR) 80 MG tablet [Pharmacy Med Name: ATORVASTATIN 80 MG TABLET] 90 tablet 3    Sig: TAKE 1 TABLET (80 MG TOTAL) BY MOUTH DAILY AT 6 PM.     Cardiovascular:  Antilipid - Statins Failed - 02/01/2020  1:48 AM      Failed - Total Cholesterol in normal range and within 360 days    Cholesterol, Total  Date Value Ref Range Status  01/14/2020 95 (L) 100 - 199 mg/dL Final         Failed - LDL in normal range and within 360 days    LDL Chol Calc (NIH)  Date Value Ref Range Status  01/14/2020 36 0 - 99 mg/dL Final         Failed - HDL in normal range and within 360 days    HDL  Date Value Ref Range Status  01/14/2020 38 (L) >39 mg/dL Final         Passed - Triglycerides in normal range and within 360 days    Triglycerides  Date Value Ref Range Status  01/14/2020 112 0 - 149 mg/dL Final         Passed - Patient is not pregnant      Passed - Valid encounter within last 12 months    Recent Outpatient Visits          3 weeks ago Essential hypertension   [facility] [name], MD   6 months ago Essential hypertension   [facility] [name], MD   1 year ago Essential hypertension   [facility] [name], MD   1 year ago Essential hypertension   [facility] [name], MD   1 year ago Epistaxis   [facility] Caryn Section, Kirstie Peri, MD

## 2020-02-02 DIAGNOSIS — M1712 Unilateral primary osteoarthritis, left knee: Secondary | ICD-10-CM | POA: Diagnosis not present

## 2020-02-02 DIAGNOSIS — M25562 Pain in left knee: Secondary | ICD-10-CM | POA: Diagnosis not present

## 2020-02-10 DIAGNOSIS — M25562 Pain in left knee: Secondary | ICD-10-CM | POA: Diagnosis not present

## 2020-02-10 DIAGNOSIS — M1712 Unilateral primary osteoarthritis, left knee: Secondary | ICD-10-CM | POA: Diagnosis not present

## 2020-02-10 NOTE — Progress Notes (Signed)
Patient: James Compton   DOB: 1951/02/15   69 y.o. Male  MRN: [id number] Visit Date: 02/11/2020  Today's Provider: Lelon Huh, MD  Subjective:    Chief Complaint  Patient presents with  . Arm Pain   Arm Pain  Incident onset: 1 week ago. There was no injury mechanism. The pain is present in the upper left arm. The quality of the pain is described as burning. The pain does not radiate. The pain has been worsening since the incident. Pertinent negatives include no chest pain, muscle weakness or tingling. The symptoms are aggravated by movement and lifting. Treatments tried: Biofreeze and Tylenol. The treatment provided no relief.  Tried applying ice which didn't help. Hurts worse as the day goes by.Dosn't hurt to move it. Heat helps it.    Past Surgical History:  Procedure Laterality Date  . CHOLECYSTECTOMY  2007  . COLONOSCOPY N/A 04/14/2015   Procedure: COLONOSCOPY;  Surgeon: Lucilla Lame, MD;  Location: [place];  Service: Gastroenterology;  Laterality: N/A;  . KNEE ARTHROPLASTY Right 11/30/2018   Procedure: COMPUTER ASSISTED TOTAL KNEE ARTHROPLASTY;  Surgeon: Dereck Leep, MD;  Location: [facility];  Service: Orthopedics;  Laterality: Right;  . KNEE ARTHROSCOPY Right 2007  . KNEE ARTHROSCOPY WITH MEDIAL MENISECTOMY  04/02/2017   Procedure: KNEE ARTHROSCOPY WITH MEDIAL MENISECTOMY CHONDRAPLASTY, SYNOVECTOMY;  Surgeon: Dereck Leep, MD;  Location: [facility];  Service: Orthopedics;;  . POLYPECTOMY  04/14/2015   Procedure: POLYPECTOMY INTESTINAL;  Surgeon: Lucilla Lame, MD;  Location: [place];  Service: Gastroenterology;;   Social History   Tobacco Use  . Smoking status: Former Smoker    Quit date: 11/25/1970    Years since quitting: 49.2  . Smokeless tobacco: Never Used  Substance Use Topics  . Alcohol use: No  . Drug use: No     Medications: Outpatient Medications Prior to Visit  Medication Sig Dispense Refill  . acetaminophen (TYLENOL) 500 MG  tablet Take 1,000 mg by mouth every 8 (eight) hours as needed (pain).    Marland Kitchen aspirin 325 MG EC tablet Take 325 mg by mouth daily.    Marland Kitchen atorvastatin (LIPITOR) 80 MG tablet TAKE 1 TABLET (80 MG TOTAL) BY MOUTH DAILY AT 6 PM. 90 tablet 3  . cetirizine (ZYRTEC ALLERGY) 10 MG tablet     . Coenzyme Q10 (COQ10) 100 MG CAPS Take 2 capsules by mouth daily.    . fluticasone (FLONASE) 50 MCG/ACT nasal spray Place 2 sprays into both nostrils daily as needed for allergies or rhinitis.     Marland Kitchen gentamicin ointment (GARAMYCIN) 0.1 % Apply 1 application topically 2 (two) times daily. Apply to affected area 3 times a day    . losartan (COZAAR) 25 MG tablet TAKE 1 TABLET BY MOUTH EVERY DAY 90 tablet 4  . Multiple Vitamin (MULTIVITAMIN) capsule Take 1 capsule by mouth daily. PM    . omeprazole (PRILOSEC) 40 MG capsule Take 40 mg by mouth daily.    . Probiotic Product (PROBIOTIC DAILY PO) Take 1 capsule by mouth daily. PM     No facility-administered medications prior to visit.      Review of Systems  Constitutional: Negative.   Respiratory: Negative.   Cardiovascular: Negative for chest pain.  Musculoskeletal: Negative.        Leg arm pain   Neurological: Negative for tingling.      Objective:   BP (!) 150/80 (BP Location: Right Arm, Patient Position: Sitting, Cuff  Size: Normal)   Pulse 66   Temp (!) 96.9 F (36.1 C) (Temporal)   Wt 208 lb 3.2 oz (94.4 kg)   BMI 31.66 kg/m  Vitals:   02/11/20 0820  BP: (!) 150/80  Pulse: 66  Temp: (!) 96.9 F (36.1 C)  TempSrc: Temporal  Weight: 208 lb 3.2 oz (94.4 kg)     Physical Exam   FROM of shoulder, no swelling or tenderness.     Assessment & Plan:    1. Acute pain of left shoulder Likely intermittent nerve irritation. May be from trauma or inflammatory response to Covid vaccine which he states he had in that exact spot a few weeks before sx starting. Patient reassured it should be self-limited. Is relieved by heat which he should continue.        Lelon Huh, MD  [facility] [phone] (phone) [phone] (fax)  [place]

## 2020-02-11 ENCOUNTER — Ambulatory Visit (INDEPENDENT_AMBULATORY_CARE_PROVIDER_SITE_OTHER): Payer: Medicare Other | Admitting: Family Medicine

## 2020-02-11 ENCOUNTER — Other Ambulatory Visit: Payer: Self-pay

## 2020-02-11 ENCOUNTER — Encounter: Payer: Self-pay | Admitting: Family Medicine

## 2020-02-11 VITALS — BP 150/80 | HR 66 | Temp 96.9°F | Wt 208.2 lb

## 2020-02-11 DIAGNOSIS — M25512 Pain in left shoulder: Secondary | ICD-10-CM

## 2020-02-15 ENCOUNTER — Ambulatory Visit: Payer: Medicare Other | Attending: Internal Medicine

## 2020-02-15 DIAGNOSIS — Z23 Encounter for immunization: Secondary | ICD-10-CM

## 2020-02-15 NOTE — Progress Notes (Signed)
   Covid-19 Vaccination Clinic  Name:  James Compton    MRN: [id number] DOB: 1951/09/02  02/15/2020  [name] was observed post Covid-19 immunization for 15 minutes without incident. He was provided with Vaccine Information Sheet and instruction to access the V-Safe system.   [name] was instructed to call 911 with any severe reactions post vaccine: Marland Kitchen Difficulty breathing  . Swelling of face and throat  . A fast heartbeat  . A bad rash all over body  . Dizziness and weakness   Immunizations Administered    Name Date Dose VIS Date Route   Pfizer COVID-19 Vaccine 02/15/2020  2:41 PM 0.3 mL 11/05/2019 Intramuscular   Manufacturer: [place]   Lot: [id number]   [place]: [id number]

## 2020-04-18 IMAGING — DX DG KNEE 1-2V PORT*R*
2 series · 2 of 2 positions shown · non-contrast
Comparison: None.

CLINICAL DATA: Status post right knee replacement

EXAM:
PORTABLE RIGHT KNEE - 2 VIEW

[knee ap]
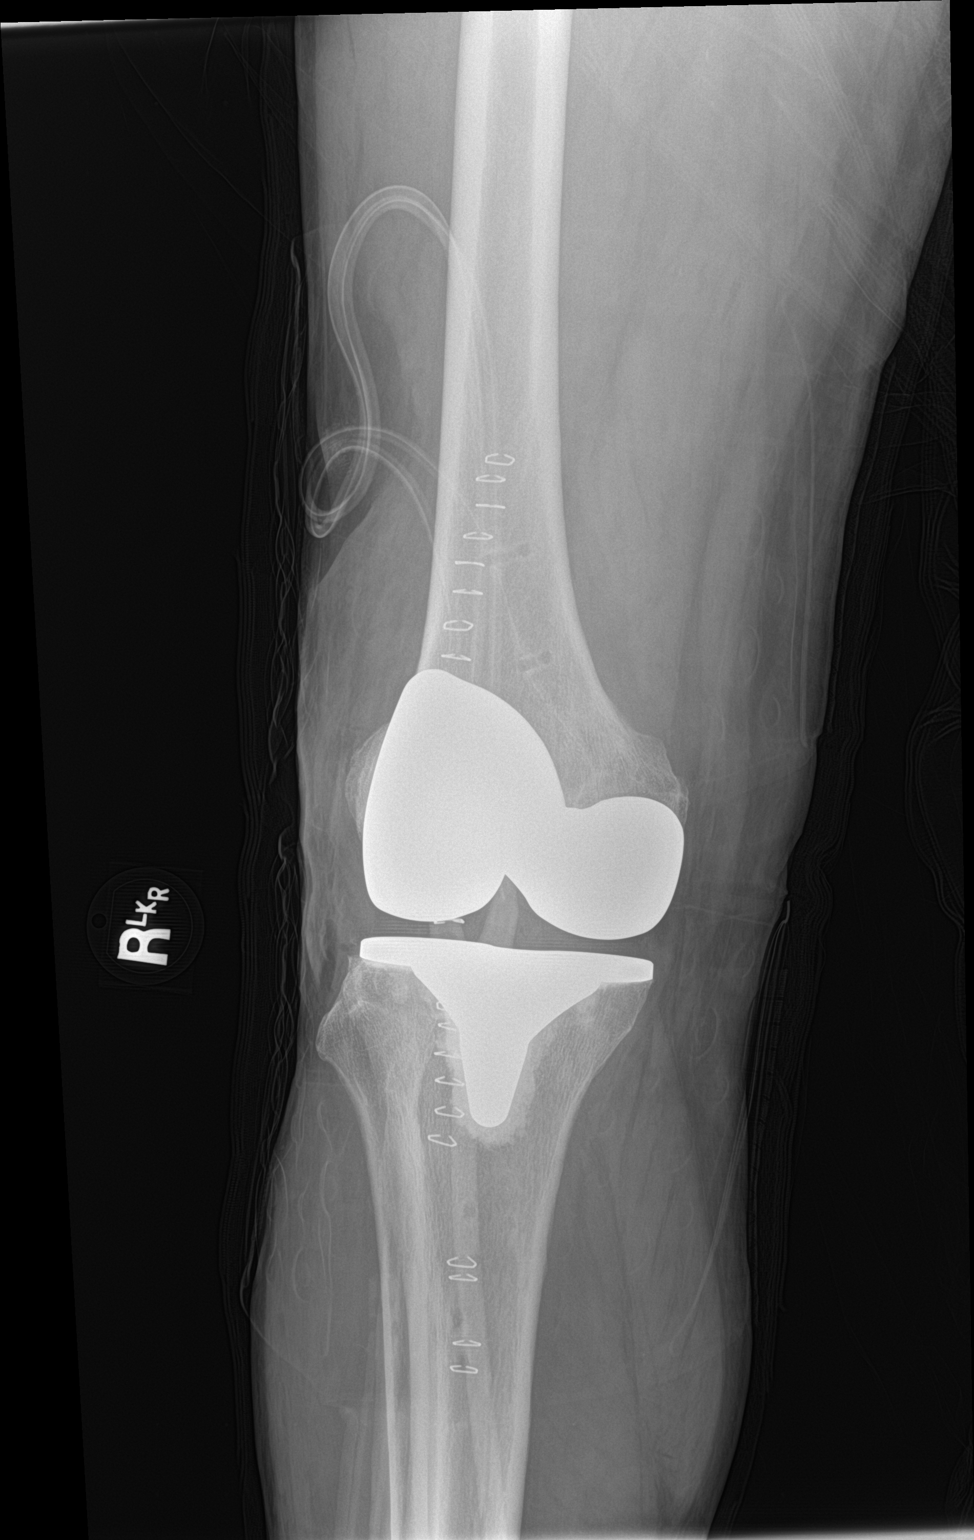

[knee lat]
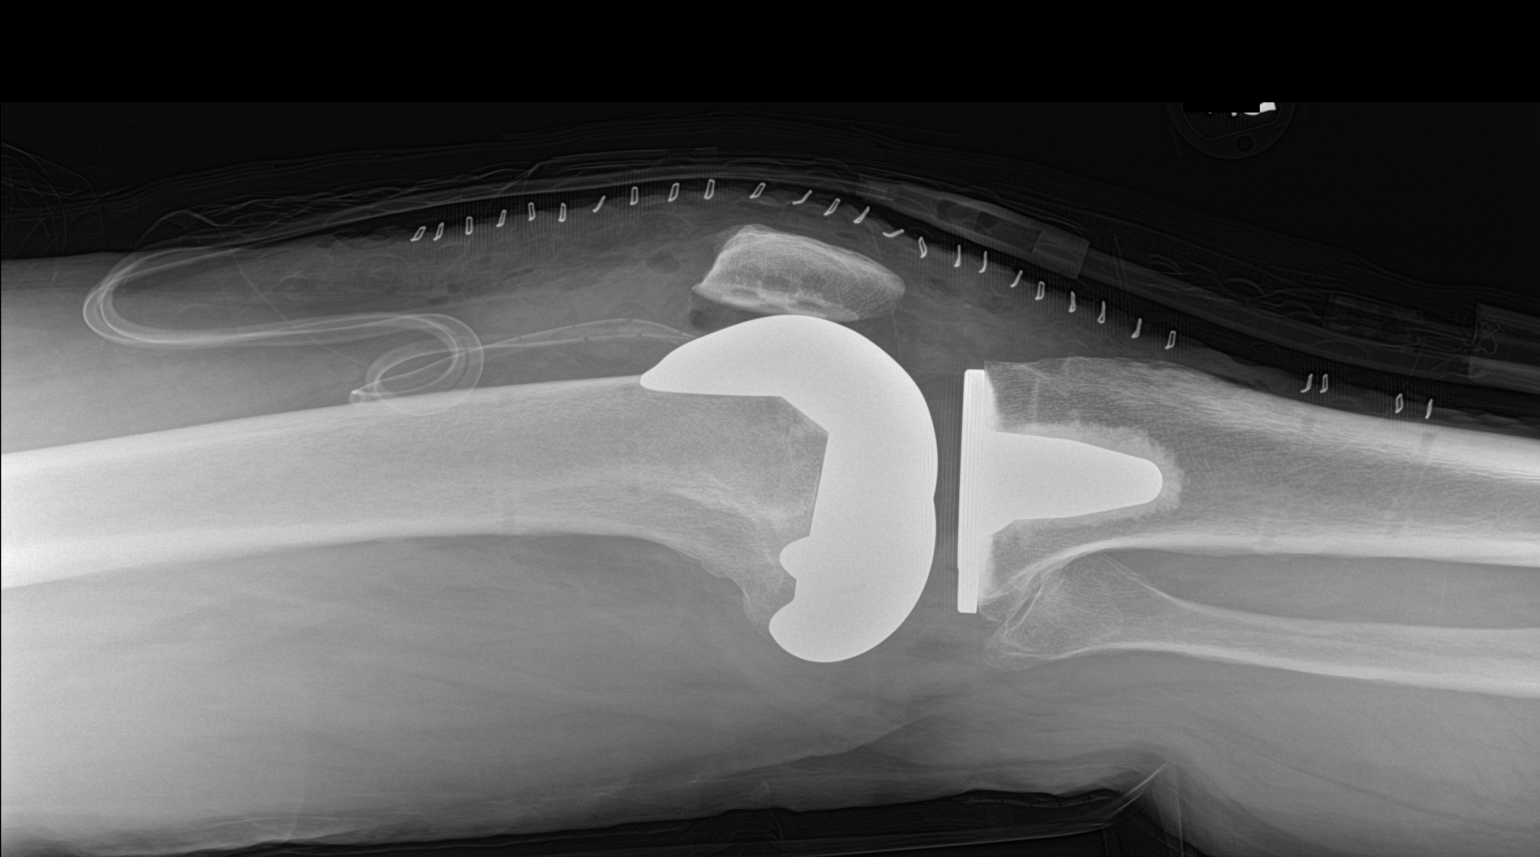

[2 of 2 positions shown; findings below may reference images not displayed]

FINDINGS: Right knee replacement is noted. No acute bony or soft tissue
abnormality is seen. Surgical drains are noted in place.
IMPRESSION: Status post right knee replacement

## 2020-06-12 DIAGNOSIS — G4733 Obstructive sleep apnea (adult) (pediatric): Secondary | ICD-10-CM | POA: Diagnosis not present

## 2020-06-12 DIAGNOSIS — R0602 Shortness of breath: Secondary | ICD-10-CM | POA: Diagnosis not present

## 2020-06-13 DIAGNOSIS — R0602 Shortness of breath: Secondary | ICD-10-CM | POA: Diagnosis not present

## 2020-06-13 DIAGNOSIS — G4733 Obstructive sleep apnea (adult) (pediatric): Secondary | ICD-10-CM | POA: Diagnosis not present

## 2020-07-17 ENCOUNTER — Other Ambulatory Visit: Payer: Self-pay

## 2020-07-17 ENCOUNTER — Ambulatory Visit (INDEPENDENT_AMBULATORY_CARE_PROVIDER_SITE_OTHER): Payer: Medicare Other | Admitting: Dermatology

## 2020-07-17 DIAGNOSIS — L570 Actinic keratosis: Secondary | ICD-10-CM | POA: Diagnosis not present

## 2020-07-17 DIAGNOSIS — L821 Other seborrheic keratosis: Secondary | ICD-10-CM | POA: Diagnosis not present

## 2020-07-17 DIAGNOSIS — L578 Other skin changes due to chronic exposure to nonionizing radiation: Secondary | ICD-10-CM | POA: Diagnosis not present

## 2020-07-17 DIAGNOSIS — L820 Inflamed seborrheic keratosis: Secondary | ICD-10-CM | POA: Diagnosis not present

## 2020-07-17 NOTE — Patient Instructions (Addendum)

## 2020-07-17 NOTE — Progress Notes (Signed)
   New Patient Visit  Subjective  James Compton is a 69 y.o. male who presents for the following: New Patient (Initial Visit).  Patient presents today as a new patient to establish care, has a few areas of concern on his face and both arms. Patient does not have a h/o skin cancer, although has a family h/o of skin cancer, he does not know what type.  The following portions of the chart were reviewed this encounter and updated as appropriate:  Tobacco  Allergies  Meds  Problems  Med Hx  Surg Hx  Fam Hx     Review of Systems:  No other skin or systemic complaints except as noted in HPI or Assessment and Plan.  Objective  Well appearing patient in no apparent distress; mood and affect are within normal limits.  A focused examination was performed including Face and arms. Relevant physical exam findings are noted in the Assessment and Plan.  Objective  Face x 10 (10): Erythematous thin papules/macules with gritty scale.   Objective  Left Lateral Elbow x 2 (2), Right Lateral elbow x 2 (2): Erythematous keratotic or waxy stuck-on papule or plaque.    Assessment & Plan  AK (actinic keratosis) (10) Face x 10  Cryotherapy today Prior to procedure, discussed risks of blister formation, small wound, skin dyspigmentation, or rare scar following cryotherapy.    Destruction of lesion - Face x 10 Complexity: simple   Destruction method: cryotherapy   Informed consent: discussed and consent obtained   Timeout:  patient name, date of birth, surgical site, and procedure verified Lesion destroyed using liquid nitrogen: Yes   Region frozen until ice ball extended beyond lesion: Yes   Outcome: patient tolerated procedure well with no complications   Post-procedure details: wound care instructions given    Inflamed seborrheic keratosis (4) Left Lateral Elbow x 2 (2); Right Lateral elbow x 2 (2)  Cryotherapy today Prior to procedure, discussed risks of blister formation, small wound,  skin dyspigmentation, or rare scar following cryotherapy.    Destruction of lesion - Left Lateral Elbow x 2, Right Lateral elbow x 2 Complexity: simple   Destruction method: cryotherapy   Informed consent: discussed and consent obtained   Timeout:  patient name, date of birth, surgical site, and procedure verified Lesion destroyed using liquid nitrogen: Yes   Region frozen until ice ball extended beyond lesion: Yes   Outcome: patient tolerated procedure well with no complications   Post-procedure details: wound care instructions given     Actinic Damage - diffuse scaly erythematous macules with underlying dyspigmentation - Recommend daily broad spectrum sunscreen SPF 30+ to sun-exposed areas, reapply every 2 hours as needed.  - Call for new or changing lesions.  Seborrheic Keratoses - Stuck-on, waxy, tan-brown papules and plaques  - Discussed benign etiology and prognosis. - Observe - Call for any changes  Return in about 6 months (around 01/17/2021) for AK Follow up.  Marene Lenz, CMA, am acting as scribe for Sarina Ser, MD . Documentation: I have reviewed the above documentation for accuracy and completeness, and I agree with the above.  Sarina Ser, MD

## 2020-07-24 ENCOUNTER — Encounter: Payer: Self-pay | Admitting: Dermatology

## 2020-07-25 DIAGNOSIS — H2513 Age-related nuclear cataract, bilateral: Secondary | ICD-10-CM | POA: Diagnosis not present

## 2020-07-25 DIAGNOSIS — H25013 Cortical age-related cataract, bilateral: Secondary | ICD-10-CM | POA: Diagnosis not present

## 2020-07-25 DIAGNOSIS — H524 Presbyopia: Secondary | ICD-10-CM | POA: Diagnosis not present

## 2020-07-25 DIAGNOSIS — H43813 Vitreous degeneration, bilateral: Secondary | ICD-10-CM | POA: Diagnosis not present

## 2020-08-25 DIAGNOSIS — M25362 Other instability, left knee: Secondary | ICD-10-CM | POA: Diagnosis not present

## 2020-08-25 DIAGNOSIS — M25562 Pain in left knee: Secondary | ICD-10-CM | POA: Diagnosis not present

## 2020-08-25 DIAGNOSIS — M1712 Unilateral primary osteoarthritis, left knee: Secondary | ICD-10-CM | POA: Diagnosis not present

## 2020-08-25 DIAGNOSIS — M21162 Varus deformity, not elsewhere classified, left knee: Secondary | ICD-10-CM | POA: Diagnosis not present

## 2020-09-07 DIAGNOSIS — M25562 Pain in left knee: Secondary | ICD-10-CM | POA: Diagnosis not present

## 2020-09-07 DIAGNOSIS — M1712 Unilateral primary osteoarthritis, left knee: Secondary | ICD-10-CM | POA: Diagnosis not present

## 2020-09-13 DIAGNOSIS — Z8673 Personal history of transient ischemic attack (TIA), and cerebral infarction without residual deficits: Secondary | ICD-10-CM | POA: Diagnosis not present

## 2020-10-21 DIAGNOSIS — Z20828 Contact with and (suspected) exposure to other viral communicable diseases: Secondary | ICD-10-CM | POA: Diagnosis not present

## 2020-10-21 DIAGNOSIS — Z20822 Contact with and (suspected) exposure to covid-19: Secondary | ICD-10-CM | POA: Insufficient documentation

## 2020-10-21 DIAGNOSIS — J019 Acute sinusitis, unspecified: Secondary | ICD-10-CM | POA: Diagnosis not present

## 2020-10-21 DIAGNOSIS — U071 COVID-19: Secondary | ICD-10-CM | POA: Diagnosis not present

## 2020-10-23 ENCOUNTER — Telehealth: Payer: Self-pay

## 2020-10-23 NOTE — Telephone Encounter (Signed)
If he is having pain or pressure around his eyes and nasal passages, or any dark green sinus drainage, then he should take the antibiotic. Those symptoms are not usually due to Covid.

## 2020-10-23 NOTE — Telephone Encounter (Signed)
Please review. Thanks!  

## 2020-10-23 NOTE — Telephone Encounter (Signed)
Copied from [place] [id number]. Topic: General - Inquiry >> Oct 23, 2020 10:33 AM Gillis Ends D wrote: Reason for CRM: Patient went to the [facility] and was told that he tested positive for Covid and has a sinus infection and was prescribed a z-pack and the patient wanted to know if he should pick the prescription up or if it would even help him at all. He would like a returned call, he can be reached at [phone]. Please advise

## 2020-10-24 NOTE — Telephone Encounter (Signed)
Patient advised and verbalized understanding 

## 2020-10-27 DIAGNOSIS — Z7189 Other specified counseling: Secondary | ICD-10-CM | POA: Diagnosis not present

## 2020-10-27 DIAGNOSIS — U071 COVID-19: Secondary | ICD-10-CM | POA: Diagnosis not present

## 2020-10-30 DIAGNOSIS — U071 COVID-19: Secondary | ICD-10-CM | POA: Diagnosis not present

## 2020-11-06 ENCOUNTER — Other Ambulatory Visit: Payer: Self-pay | Admitting: Family Medicine

## 2020-11-29 DIAGNOSIS — Z8673 Personal history of transient ischemic attack (TIA), and cerebral infarction without residual deficits: Secondary | ICD-10-CM | POA: Diagnosis not present

## 2020-11-29 DIAGNOSIS — I10 Essential (primary) hypertension: Secondary | ICD-10-CM | POA: Diagnosis not present

## 2020-11-29 DIAGNOSIS — R0789 Other chest pain: Secondary | ICD-10-CM | POA: Diagnosis not present

## 2020-11-29 DIAGNOSIS — G4733 Obstructive sleep apnea (adult) (pediatric): Secondary | ICD-10-CM | POA: Diagnosis not present

## 2020-11-29 DIAGNOSIS — I77811 Abdominal aortic ectasia: Secondary | ICD-10-CM | POA: Diagnosis not present

## 2020-11-30 DIAGNOSIS — Z96651 Presence of right artificial knee joint: Secondary | ICD-10-CM | POA: Diagnosis not present

## 2020-11-30 DIAGNOSIS — E119 Type 2 diabetes mellitus without complications: Secondary | ICD-10-CM | POA: Diagnosis not present

## 2020-12-01 ENCOUNTER — Other Ambulatory Visit: Payer: Self-pay | Admitting: Family Medicine

## 2020-12-14 DIAGNOSIS — M1712 Unilateral primary osteoarthritis, left knee: Secondary | ICD-10-CM | POA: Diagnosis not present

## 2020-12-14 DIAGNOSIS — M25562 Pain in left knee: Secondary | ICD-10-CM | POA: Diagnosis not present

## 2020-12-22 DIAGNOSIS — M1712 Unilateral primary osteoarthritis, left knee: Secondary | ICD-10-CM | POA: Diagnosis not present

## 2020-12-22 DIAGNOSIS — M25562 Pain in left knee: Secondary | ICD-10-CM | POA: Diagnosis not present

## 2020-12-28 DIAGNOSIS — M25562 Pain in left knee: Secondary | ICD-10-CM | POA: Diagnosis not present

## 2020-12-28 DIAGNOSIS — M1712 Unilateral primary osteoarthritis, left knee: Secondary | ICD-10-CM | POA: Diagnosis not present

## 2021-01-02 ENCOUNTER — Other Ambulatory Visit: Payer: Self-pay | Admitting: Family Medicine

## 2021-01-03 DIAGNOSIS — R0789 Other chest pain: Secondary | ICD-10-CM | POA: Diagnosis not present

## 2021-01-10 DIAGNOSIS — Z23 Encounter for immunization: Secondary | ICD-10-CM | POA: Diagnosis not present

## 2021-01-10 DIAGNOSIS — I77811 Abdominal aortic ectasia: Secondary | ICD-10-CM | POA: Diagnosis not present

## 2021-01-10 DIAGNOSIS — E119 Type 2 diabetes mellitus without complications: Secondary | ICD-10-CM | POA: Diagnosis not present

## 2021-01-10 DIAGNOSIS — Z794 Long term (current) use of insulin: Secondary | ICD-10-CM | POA: Diagnosis not present

## 2021-01-10 DIAGNOSIS — I10 Essential (primary) hypertension: Secondary | ICD-10-CM | POA: Diagnosis not present

## 2021-01-10 DIAGNOSIS — G4733 Obstructive sleep apnea (adult) (pediatric): Secondary | ICD-10-CM | POA: Diagnosis not present

## 2021-01-17 ENCOUNTER — Other Ambulatory Visit: Payer: Self-pay

## 2021-01-17 ENCOUNTER — Ambulatory Visit (INDEPENDENT_AMBULATORY_CARE_PROVIDER_SITE_OTHER): Payer: Medicare Other | Admitting: Dermatology

## 2021-01-17 DIAGNOSIS — L821 Other seborrheic keratosis: Secondary | ICD-10-CM | POA: Diagnosis not present

## 2021-01-17 DIAGNOSIS — L570 Actinic keratosis: Secondary | ICD-10-CM | POA: Diagnosis not present

## 2021-01-17 DIAGNOSIS — L578 Other skin changes due to chronic exposure to nonionizing radiation: Secondary | ICD-10-CM

## 2021-01-17 DIAGNOSIS — L814 Other melanin hyperpigmentation: Secondary | ICD-10-CM

## 2021-01-17 NOTE — Patient Instructions (Signed)
Cryotherapy Aftercare  . Wash gently with soap and water everyday.   . Apply Vaseline and Band-Aid daily until healed.  

## 2021-01-17 NOTE — Progress Notes (Signed)
   Follow-Up Visit   Subjective  James Compton is a 70 y.o. male who presents for the following: Actinic Keratosis (6 mo AK f/u, face x 10. Pt states that most have gone away and he also reports some new spots on the hairline that he has noticed.).  The following portions of the chart were reviewed this encounter and updated as appropriate:  Tobacco  Allergies  Meds  Problems  Med Hx  Surg Hx  Fam Hx     Review of Systems: No other skin or systemic complaints except as noted in HPI or Assessment and Plan.   Objective  Well appearing patient in no apparent distress; mood and affect are within normal limits.  A focused examination was performed including face, scalp. Relevant physical exam findings are noted in the Assessment and Plan.  Objective  face and scalp x 9 (9): Erythematous thin papules/macules with gritty scale.   Assessment & Plan  AK (actinic keratosis) (9) face and scalp x 9  Prior to procedure, discussed risks of blister formation, small wound, skin dyspigmentation, or rare scar following cryotherapy.    Destruction of lesion - face and scalp x 9 Complexity: simple   Destruction method: cryotherapy   Informed consent: discussed and consent obtained   Timeout:  patient name, date of birth, surgical site, and procedure verified Lesion destroyed using liquid nitrogen: Yes   Region frozen until ice ball extended beyond lesion: Yes   Outcome: patient tolerated procedure well with no complications   Post-procedure details: wound care instructions given     Actinic Damage - chronic, secondary to cumulative UV radiation exposure/sun exposure over time - diffuse scaly erythematous macules with underlying dyspigmentation - Recommend daily broad spectrum sunscreen SPF 30+ to sun-exposed areas, reapply every 2 hours as needed.  - Call for new or changing lesions.  Seborrheic Keratoses - Stuck-on, waxy, tan-brown papules and plaques  - Discussed benign etiology and  prognosis. - Observe - Call for any changes  Lentigines - Scattered tan macules - Due to sun exposure - Benign-appering, observe - Recommend daily broad spectrum sunscreen SPF 30+ to sun-exposed areas, reapply every 2 hours as needed. - Call for any changes  Return in about 6 months (around 07/17/2021) for AK f/u.   IHarriett Sine, CMA, am acting as scribe for Sarina Ser, MD.  Documentation: I have reviewed the above documentation for accuracy and completeness, and I agree with the above.  Sarina Ser, MD

## 2021-01-20 ENCOUNTER — Encounter: Payer: Self-pay | Admitting: Dermatology

## 2021-01-25 ENCOUNTER — Other Ambulatory Visit: Payer: Self-pay | Admitting: Family Medicine

## 2021-01-25 NOTE — Telephone Encounter (Signed)
Courtesy refill per protocol. Give enough med to last until 04/02/21

## 2021-02-03 ENCOUNTER — Other Ambulatory Visit: Payer: Self-pay | Admitting: Family Medicine

## 2021-02-03 NOTE — Telephone Encounter (Signed)
Requested Prescriptions  Pending Prescriptions Disp Refills  . atorvastatin (LIPITOR) 80 MG tablet [Pharmacy Med Name: ATORVASTATIN 80 MG TABLET] 90 tablet 0    Sig: TAKE 1 TABLET (80 MG TOTAL) BY MOUTH DAILY AT 6 PM.     Cardiovascular:  Antilipid - Statins Failed - 02/03/2021  9:07 AM      Failed - Total Cholesterol in normal range and within 360 days    Cholesterol, Total  Date Value Ref Range Status  01/14/2020 95 (L) 100 - 199 mg/dL Final         Failed - LDL in normal range and within 360 days    LDL Chol Calc (NIH)  Date Value Ref Range Status  01/14/2020 36 0 - 99 mg/dL Final         Failed - HDL in normal range and within 360 days    HDL  Date Value Ref Range Status  01/14/2020 38 (L) >39 mg/dL Final         Failed - Triglycerides in normal range and within 360 days    Triglycerides  Date Value Ref Range Status  01/14/2020 112 0 - 149 mg/dL Final         Passed - Patient is not pregnant      Passed - Valid encounter within last 12 months    Recent Outpatient Visits          11 months ago Acute pain of left shoulder   [facility] [name], MD   1 year ago Essential hypertension   [facility] [name], MD   1 year ago Essential hypertension   [facility] [name], MD   2 years ago Essential hypertension   [facility] [name], MD   2 years ago Essential hypertension   [facility] [name], MD      Future Appointments            In 5 months Ralene Bathe, MD [place]

## 2021-03-29 DIAGNOSIS — M1712 Unilateral primary osteoarthritis, left knee: Secondary | ICD-10-CM | POA: Diagnosis not present

## 2021-03-29 DIAGNOSIS — M25562 Pain in left knee: Secondary | ICD-10-CM | POA: Diagnosis not present

## 2021-04-01 ENCOUNTER — Other Ambulatory Visit: Payer: Self-pay | Admitting: Family Medicine

## 2021-04-01 NOTE — Telephone Encounter (Signed)
Pt has appt 04/02/21- Previous refill gave enough med to last until appt.  Routing to office.

## 2021-04-02 ENCOUNTER — Ambulatory Visit (INDEPENDENT_AMBULATORY_CARE_PROVIDER_SITE_OTHER): Payer: Medicare Other | Admitting: Family Medicine

## 2021-04-02 ENCOUNTER — Other Ambulatory Visit: Payer: Self-pay

## 2021-04-02 VITALS — BP 150/80 | HR 68 | Ht 69.0 in | Wt 216.0 lb

## 2021-04-02 DIAGNOSIS — I77811 Abdominal aortic ectasia: Secondary | ICD-10-CM

## 2021-04-02 DIAGNOSIS — Z125 Encounter for screening for malignant neoplasm of prostate: Secondary | ICD-10-CM | POA: Diagnosis not present

## 2021-04-02 DIAGNOSIS — I10 Essential (primary) hypertension: Secondary | ICD-10-CM | POA: Diagnosis not present

## 2021-04-02 DIAGNOSIS — A070 Balantidiasis: Secondary | ICD-10-CM

## 2021-04-02 DIAGNOSIS — E785 Hyperlipidemia, unspecified: Secondary | ICD-10-CM | POA: Diagnosis not present

## 2021-04-02 DIAGNOSIS — Z0000 Encounter for general adult medical examination without abnormal findings: Secondary | ICD-10-CM | POA: Diagnosis not present

## 2021-04-02 MED ORDER — LOSARTAN POTASSIUM 25 MG PO TABS: 50.0000 mg | ORAL_TABLET | Freq: Every day | ORAL | 0 refills | Status: DC

## 2021-04-02 MED ORDER — KETOCONAZOLE 2 % EX CREA: 1.0000 "application " | TOPICAL_CREAM | Freq: Every day | CUTANEOUS | 1 refills | Status: DC | PRN

## 2021-04-02 NOTE — Patient Instructions (Addendum)
.   Please review the attached list of medications and notify my office if there are any errors.   The CDC recommends two doses of Shingrix (the shingles vaccine) separated by 2 to 6 months for adults age 72 years and older. I recommend checking with your insurance plan regarding coverage for this vaccine.   . You are due for a Tdap (tetanus-diptheria-pertussis vaccine) which protects you from tetanus and whooping cough. Please check with your insurance plan or pharmacy regarding coverage for this vaccine.    Increase losartan to 50mg  daily by taking 2 x 25mg  tablets until they are gone. Let me know before they run out and I'll send in a prescription for 50mg  tablets.

## 2021-04-02 NOTE — Progress Notes (Signed)
Annual Wellness Visit     Patient: James Compton, Male    DOB: Apr 05, 1951, 70 y.o.   MRN: [id number] Visit Date: 04/02/2021  Today's Provider: Lelon Huh, MD   Chief Complaint  Patient presents with  . Medicare Wellness   Subjective    James Compton is a 70 y.o. male who presents today for his Annual Wellness Visit. He reports consuming a healthy diet.  He generally feels well. He reports sleeping fairly well. He does not have additional problems to discuss today.   HPI He is also here to follow up on cholesterol and hypertension. Doing well with current medications. Had follow up Dr. Ubaldo Glassing in February and had stress test. States his BP was very high during stress test. No current chest pains.   He does report occasional irritation and drainage around foreskin for several months.      Medications: Outpatient Medications Prior to Visit  Medication Sig  . acetaminophen (TYLENOL) 500 MG tablet Take 1,000 mg by mouth every 8 (eight) hours as needed (pain).  Marland Kitchen aspirin 325 MG EC tablet Take 325 mg by mouth daily.  Marland Kitchen atorvastatin (LIPITOR) 80 MG tablet TAKE 1 TABLET (80 MG TOTAL) BY MOUTH DAILY AT 6 PM.  . cetirizine (ZYRTEC) 10 MG tablet   . Coenzyme Q10 (COQ10) 100 MG CAPS Take 2 capsules by mouth daily.  . fluticasone (FLONASE) 50 MCG/ACT nasal spray Place 2 sprays into both nostrils daily as needed for allergies or rhinitis.   Marland Kitchen gentamicin ointment (GARAMYCIN) 0.1 % Apply 1 application topically 2 (two) times daily. Apply to affected area 3 times a day  . losartan (COZAAR) 25 MG tablet TAKE 1 TABLET BY MOUTH EVERY DAY  . Multiple Vitamin (MULTIVITAMIN) capsule Take 1 capsule by mouth daily. PM  . omeprazole (PRILOSEC) 40 MG capsule Take 40 mg by mouth daily.  . Probiotic Product (PROBIOTIC DAILY PO) Take 1 capsule by mouth daily. PM   No facility-administered medications prior to visit.    Allergies  Allergen Reactions  . Dipyridamole     Headaches, nausea, upset  stomach  . Penicillins Nausea Only    Has patient had a PCN reaction causing immediate rash, facial/tongue/throat swelling, SOB or lightheadedness with hypotension: no Has patient had a PCN reaction causing severe rash involving mucus membranes or skin necrosis: no Has patient had a PCN reaction that required hospitalization: no Has patient had a PCN reaction occurring within the last 10 years: no If all of the above answers are "NO", then may proceed with Cephalosporin use.     Patient Care Team: [name], MD as PCP - General (Family Medicine) Ubaldo Glassing Javier Docker, MD as Consulting Physician (Cardiology) Marry Guan, Laurice Record, MD as Consulting Physician (Orthopedic Surgery) Beverly Gust, MD as Consulting Physician (Otolaryngology) Jannifer Franklin, NP as Nurse Practitioner (Neurology)  Review of Systems      Objective    Vitals: BP (!) 150/80   Pulse 68   Ht 5\' 9"  (1.753 m)   Wt 216 lb (98 kg)   SpO2 100%   BMI 31.90 kg/m    Physical Exam  General: Appearance:    Mildly obese male in no acute distress  Eyes:    PERRL, conjunctiva/corneas clear, EOM's intact       Lungs:     Clear to auscultation bilaterally, respirations unlabored  Heart:    Normal heart rate. Normal rhythm. No murmurs, rubs, or gallops.   MS:  All extremities are intact.   Neurologic:   Awake, alert, oriented x 3. No apparent focal neurological           defect.         Most recent functional status assessment: In your present state of health, do you have any difficulty performing the following activities: 04/02/2021  Hearing? Y  Vision? N  Difficulty concentrating or making decisions? N  Walking or climbing stairs? N  Dressing or bathing? N  Doing errands, shopping? N  Some recent data might be hidden   Most recent fall risk assessment: Fall Risk  04/02/2021  Falls in the past year? 1  Comment -  Number falls in past yr: 0  Injury with Fall? 0    Most recent depression screenings: PHQ 2/9  Scores 04/02/2021 11/22/2019  PHQ - 2 Score 0 0  PHQ- 9 Score 0 -   Most recent cognitive screening: 6CIT Screen 10/31/2017  What Year? 0 points  What month? 0 points  What time? 0 points  Count back from 20 0 points  Months in reverse 2 points  Repeat phrase 0 points  Total Score 2   Most recent Audit-C alcohol use screening Alcohol Use Disorder Test (AUDIT) 04/02/2021  1. How often do you have a drink containing alcohol? 0  2. How many drinks containing alcohol do you have on a typical day when you are drinking? 0  3. How often do you have six or more drinks on one occasion? 0  AUDIT-C Score 0  Alcohol Brief Interventions/Follow-up -   A score of 3 or more in women, and 4 or more in men indicates increased risk for alcohol abuse, EXCEPT if all of the points are from question 1   No results found for any visits on 04/02/21.  Assessment & Plan     Annual wellness visit done today including the all of the following: Reviewed patient's Family Medical History Reviewed and updated list of patient's medical providers Assessment of cognitive impairment was done Assessed patient's functional ability Established a written schedule for health screening [place] Completed and Reviewed  Exercise Activities and Dietary recommendations Goals    . DIET - REDUCE CALORIE INTAKE     Recommend to cut back on heavy carbohydrates to help aid in weight loss.        Immunization History  Administered Date(s) Administered  . Fluad Quad(high Dose 65+) 08/04/2019  . Influenza, High Dose Seasonal PF 08/26/2017, 09/24/2018  . Influenza-Unspecified 09/25/2016  . PFIZER(Purple Top)SARS-COV-2 Vaccination 01/21/2020, 02/15/2020  . Pneumococcal Conjugate-13 01/19/2014  . Pneumococcal Polysaccharide-23 03/24/2015  . Td 07/04/2004, 09/25/2010  . Tdap 09/25/2010  . Zoster 09/17/2011    Health Maintenance  Topic Date Due  . COVID-19 Vaccine (3 - Pfizer risk 4-dose series)  03/14/2020  . TETANUS/TDAP  09/25/2020  . INFLUENZA VACCINE  06/25/2021  . COLONOSCOPY (Pts 45-2yrs Insurance coverage will need to be confirmed)  04/13/2025  . Hepatitis C Screening  Completed  . PNA vac Low Risk Adult  Completed  . HPV VACCINES  Aged Out     Discussed health benefits of physical activity, and encouraged him to engage in regular exercise appropriate for his age and condition.    1. Essential hypertension Double on losartan. Recheck in 1 month.  - Lipid panel - TSH  2. Hyperlipidemia, unspecified hyperlipidemia type .rosvua  - CBC - Comprehensive metabolic panel  3. Balantidiasis Likely yeast infection.  - ketoconazole (NIZORAL) 2 %  cream; Apply 1 application topically daily as needed for irritation.  Dispense: 15 g; Refill: 1   4.  Aortic ectasia, abdominal ([place]) Aortic ultrasound due in 2024  6. Prostate cancer screening  - PSA Total (Reflex To Free) ([facility] only)  7. Medicare annual wellness visit, subsequent        The entirety of the information documented in the History of Present Illness, Review of Systems and Physical Exam were personally obtained by me. Portions of this information were initially documented by the CMA and reviewed by me for thoroughness and accuracy.      Lelon Huh, MD  [facility] [phone] (phone) [phone] (fax)  [place]

## 2021-04-03 LAB — COMPREHENSIVE METABOLIC PANEL
ALT: 22 IU/L (ref 0–44)
AST: 32 IU/L (ref 0–40)
Albumin/Globulin Ratio: 2 (ref 1.2–2.2)
Albumin: 4.7 g/dL (ref 3.7–4.7)
Alkaline Phosphatase: 161 IU/L — ABNORMAL HIGH (ref 44–121)
BUN/Creatinine Ratio: 16 (ref 10–24)
BUN: 13 mg/dL (ref 8–27)
Bilirubin Total: 1.4 mg/dL — ABNORMAL HIGH (ref 0.0–1.2)
CO2: 25 mmol/L (ref 20–29)
Calcium: 9.2 mg/dL (ref 8.6–10.2)
Chloride: 103 mmol/L (ref 96–106)
Creatinine, Ser: 0.8 mg/dL (ref 0.76–1.27)
Globulin, Total: 2.3 g/dL (ref 1.5–4.5)
Glucose: 101 mg/dL — ABNORMAL HIGH (ref 65–99)
Potassium: 4.3 mmol/L (ref 3.5–5.2)
Sodium: 142 mmol/L (ref 134–144)
Total Protein: 7 g/dL (ref 6.0–8.5)
eGFR: 94 mL/min/{1.73_m2} (ref >59)

## 2021-04-03 LAB — PSA TOTAL (REFLEX TO FREE): Prostate Specific Ag, Serum: 1.1 ng/mL (ref 0.0–4.0)

## 2021-04-03 LAB — LIPID PANEL
Chol/HDL Ratio: 2.5 ratio (ref 0.0–5.0)
Cholesterol, Total: 102 mg/dL (ref 100–199)
HDL: 41 mg/dL (ref >39)
LDL Chol Calc (NIH): 40 mg/dL (ref 0–99)
Triglycerides: 118 mg/dL (ref 0–149)
VLDL Cholesterol Cal: 21 mg/dL (ref 5–40)

## 2021-04-03 LAB — CBC
Hematocrit: 45.5 % (ref 37.5–51.0)
Hemoglobin: 15.4 g/dL (ref 13.0–17.7)
MCH: 30.7 pg (ref 26.6–33.0)
MCHC: 33.8 g/dL (ref 31.5–35.7)
MCV: 91 fL (ref 79–97)
Platelets: 184 10*3/uL (ref 150–450)
RBC: 5.02 x10E6/uL (ref 4.14–5.80)
RDW: 12.5 % (ref 11.6–15.4)
WBC: 6.9 10*3/uL (ref 3.4–10.8)

## 2021-04-03 LAB — TSH: TSH: 1.84 u[IU]/mL (ref 0.450–4.500)

## 2021-04-10 DIAGNOSIS — Z23 Encounter for immunization: Secondary | ICD-10-CM | POA: Diagnosis not present

## 2021-04-16 ENCOUNTER — Telehealth: Payer: Self-pay

## 2021-04-16 DIAGNOSIS — I10 Essential (primary) hypertension: Secondary | ICD-10-CM

## 2021-04-16 NOTE — Telephone Encounter (Signed)
Copied from [place] [id number]. Topic: Quick Communication - Rx Refill/Question >> Apr 16, 2021  2:58 PM Loma Boston wrote: Medication: losartan (COZAAR) 50 MG tablet Pt states last script was 25 mg and he was taking 2 pills. Dr Caryn Section wanted him to finish those and he would call in the one tablet at 50 mg.   Has the patient contacted their pharmacy no (Agent: If no, request that the patient contact the pharmacy for the refill.) pt was told to contact dr Caryn Section for a new 50 mg script (Agent: If yes, when and what did the pharmacy advise?)  Preferred Pharmacy (with phone number or street name): [id number] [place] [address] [place] [address] Phone: [phone] Fax: [phone] Hours: Not open 24 hours    Agent: Please be advised that RX refills may take up to 3 business days. We ask that you follow-up with your pharmacy.

## 2021-04-17 MED ORDER — LOSARTAN POTASSIUM 50 MG PO TABS: 50.0000 mg | ORAL_TABLET | Freq: Every day | ORAL | 4 refills | Status: DC

## 2021-04-17 NOTE — Addendum Note (Signed)
Addended by: [name] on: 04/17/2021 04:52 PM   Modules accepted: Orders

## 2021-04-17 NOTE — Telephone Encounter (Signed)
Okay to authorize refill as  50mg  daily? KW

## 2021-04-28 ENCOUNTER — Other Ambulatory Visit: Payer: Self-pay | Admitting: Family Medicine

## 2021-04-28 NOTE — Telephone Encounter (Signed)
Requested medication (s) are due for refill today: no  Requested medication (s) are on the active medication list: yes  Last refill:  02/03/21 #90  Future visit scheduled: yes in 3 days  Notes to clinic:  pt should have enough med to last until upcoming appt.   Requested Prescriptions  Pending Prescriptions Disp Refills   atorvastatin (LIPITOR) 80 MG tablet [Pharmacy Med Name: ATORVASTATIN 80 MG TABLET] 90 tablet 0    Sig: TAKE 1 TABLET BY MOUTH DAILY AT 6 PM.      Cardiovascular:  Antilipid - Statins Failed - 04/28/2021  4:55 PM      Failed - Valid encounter within last 12 months    Recent Outpatient Visits           3 weeks ago Medicare annual wellness visit, subsequent   [facility] [name], MD   1 year ago Acute pain of left shoulder   [facility] [name], MD   1 year ago Essential hypertension   [facility] [name], MD   1 year ago Essential hypertension   [facility] [name], MD   2 years ago Essential hypertension   [facility] [name], MD       Future Appointments             In 3 days Fisher, Kirstie Peri, MD [facility] [place]   In 2 months Ralene Bathe, MD [place] - Total Cholesterol in normal range and within 360 days    Cholesterol, Total  Date Value Ref Range Status  04/02/2021 102 100 - 199 mg/dL Final          Passed - LDL in normal range and within 360 days    LDL Chol Calc (NIH)  Date Value Ref Range Status  04/02/2021 40 0 - 99 mg/dL Final          Passed - HDL in normal range and within 360 days    HDL  Date Value Ref Range Status  04/02/2021 41 >39 mg/dL Final          Passed - Triglycerides in normal range and within 360 days    Triglycerides  Date Value Ref Range Status  04/02/2021 118 0 - 149 mg/dL Final          Passed - Patient is not pregnant

## 2021-05-01 ENCOUNTER — Encounter: Payer: Self-pay | Admitting: Family Medicine

## 2021-05-01 ENCOUNTER — Other Ambulatory Visit: Payer: Self-pay

## 2021-05-01 ENCOUNTER — Ambulatory Visit (INDEPENDENT_AMBULATORY_CARE_PROVIDER_SITE_OTHER): Payer: Medicare Other | Admitting: Family Medicine

## 2021-05-01 VITALS — BP 136/66 | HR 58 | Wt 211.0 lb

## 2021-05-01 DIAGNOSIS — I10 Essential (primary) hypertension: Secondary | ICD-10-CM | POA: Diagnosis not present

## 2021-05-01 DIAGNOSIS — R110 Nausea: Secondary | ICD-10-CM

## 2021-05-01 DIAGNOSIS — R748 Abnormal levels of other serum enzymes: Secondary | ICD-10-CM

## 2021-05-01 MED ORDER — SCOPOLAMINE 1 MG/3DAYS TD PT72: 1.0000 | MEDICATED_PATCH | TRANSDERMAL | 0 refills | Status: DC

## 2021-05-01 NOTE — Patient Instructions (Signed)

## 2021-05-01 NOTE — Progress Notes (Signed)
Established patient visit   Patient: James Compton   DOB: 16-Dec-1950   70 y.o. Male  MRN: [id number] Visit Date: 05/01/2021  Today's healthcare provider: Lelon Huh, MD   Chief Complaint  Patient presents with  . Hypertension   Subjective    HPI  Hypertension, follow-up  BP Readings from Last 3 Encounters:  04/02/21 (!) 150/80  02/11/20 (!) 150/80  01/11/20 (!) 160/80   Wt Readings from Last 3 Encounters:  04/02/21 216 lb (98 kg)  02/11/20 208 lb 3.2 oz (94.4 kg)  01/11/20 207 lb (93.9 kg)     He was last seen for hypertension 1 months ago.  BP at that visit was 150/80. Management since that visit includes doubling the dose of Losartan.  He reports excellent compliance with treatment. He is not having side effects.  He is following a Low Sodium diet. He is exercising. He does not smoke.  Use of agents associated with hypertension: NSAIDS.   Outside blood pressures are 120's/70's. Symptoms: No chest pain No chest pressure  No palpitations No syncope  No dyspnea No orthopnea  No paroxysmal nocturnal dyspnea No lower extremity edema   Pertinent labs: Lab Results  Component Value Date   CHOL 102 04/02/2021   HDL 41 04/02/2021   LDLCALC 40 04/02/2021   TRIG 118 04/02/2021   CHOLHDL 2.5 04/02/2021   Lab Results  Component Value Date   NA 142 04/02/2021   K 4.3 04/02/2021   CREATININE 0.80 04/02/2021   GFRNONAA 88 01/14/2020   GFRAA 101 01/14/2020   GLUCOSE 101 (H) 04/02/2021     The ASCVD Risk score (Goff DC Jr., et al., 2013) failed to calculate for the following reasons:   The valid total cholesterol range is 130 to 320 mg/dL   --------------------------------------------------------------------------------------------------- He does report episodic waves of nausea that can last from 3-15 minutes, not associated with any pain. Not associated with eating. No BM changes. Has had cholecystectomy remotely. Was noted to have mildly elevated alk  phosph and bilirubin on labs done in May.   Allergies  Allergen Reactions  . Dipyridamole     Headaches, nausea, upset stomach  . Penicillins Nausea Only    Has patient had a PCN reaction causing immediate rash, facial/tongue/throat swelling, SOB or lightheadedness with hypotension: no Has patient had a PCN reaction causing severe rash involving mucus membranes or skin necrosis: no Has patient had a PCN reaction that required hospitalization: no Has patient had a PCN reaction occurring within the last 10 years: no If all of the above answers are "NO", then may proceed with Cephalosporin use.      Medications: Outpatient Medications Prior to Visit  Medication Sig  . acetaminophen (TYLENOL) 500 MG tablet Take 1,000 mg by mouth every 8 (eight) hours as needed (pain).  Marland Kitchen aspirin 325 MG EC tablet Take 325 mg by mouth daily.  Marland Kitchen atorvastatin (LIPITOR) 80 MG tablet Take 1 tablet (80 mg total) by mouth every evening.  . cetirizine (ZYRTEC) 10 MG tablet   . Coenzyme Q10 (COQ10) 100 MG CAPS Take 2 capsules by mouth daily.  . fluticasone (FLONASE) 50 MCG/ACT nasal spray Place 2 sprays into both nostrils daily as needed for allergies or rhinitis.   Marland Kitchen gentamicin ointment (GARAMYCIN) 0.1 % Apply 1 application topically 2 (two) times daily. Apply to affected area 3 times a day  . ketoconazole (NIZORAL) 2 % cream Apply 1 application topically daily as needed for irritation.  Marland Kitchen  losartan (COZAAR) 50 MG tablet Take 1 tablet (50 mg total) by mouth daily.  . Multiple Vitamin (MULTIVITAMIN) capsule Take 1 capsule by mouth daily. PM  . omeprazole (PRILOSEC) 40 MG capsule Take 40 mg by mouth daily.  . Probiotic Product (PROBIOTIC DAILY PO) Take 1 capsule by mouth daily. PM   No facility-administered medications prior to visit.    Review of Systems  Constitutional: Negative.   Respiratory: Negative.   Cardiovascular: Negative.   Gastrointestinal: Negative.   Neurological: Negative for dizziness,  syncope, speech difficulty, light-headedness and headaches.     Objective    BP 136/66 (BP Location: Right Arm, Patient Position: Sitting, Cuff Size: Large)   Pulse (!) 58   Wt 211 lb (95.7 kg)   SpO2 100%   BMI 31.16 kg/m     Physical Exam  General appearance: Mildly obese male, cooperative and in no acute distress Head: Normocephalic, without obvious abnormality, atraumatic Respiratory: Respirations even and unlabored, normal respiratory rate Extremities: All extremities are intact.  Skin: Skin color, texture, turgor normal. No rashes seen  Psych: Appropriate mood and affect. Neurologic: Mental status: Alert, oriented to person, place, and time, thought content appropriate.    Assessment & Plan     1. Essential hypertension Much better with increased dose of losartan to $RemoveBef'50mg'tTgbchrVSi$ .   2. Nausea  - US Abdomen Limited RUQ (LIVER/GB); Future  3. Elevated alkaline phosphatase level  - US Abdomen Limited RUQ (LIVER/GB); Future  He also reports he will be going on in [place] and requests prescription for scopolamine patch - scopolamine (TRANSDERM-SCOP, 1.5 MG,) 1 MG/3DAYS; Place 1 patch (1.5 mg total) onto the skin every 3 (three) days.  Dispense: 10 patch; Refill: 0           Lelon Huh, MD  [facility] [phone] (phone) [phone] (fax)  [place]

## 2021-05-02 ENCOUNTER — Telehealth: Payer: Self-pay

## 2021-05-02 NOTE — Telephone Encounter (Signed)
[facility] to clarify scopolamine patch instructions. Call dropped. Attempted to call back. Pharmacy closed for lunch.

## 2021-05-04 NOTE — Telephone Encounter (Signed)
I returned [facility] call. PA was approved and patient has already picked up prescription.

## 2021-05-04 NOTE — Telephone Encounter (Signed)
[facility] regarding PA for Scopolamine Patches Please advise CB- [phone]

## 2021-05-09 ENCOUNTER — Encounter (HOSPITAL_COMMUNITY): Payer: Self-pay

## 2021-05-09 ENCOUNTER — Ambulatory Visit (HOSPITAL_COMMUNITY): Payer: Medicare Other

## 2021-05-15 ENCOUNTER — Other Ambulatory Visit: Payer: Self-pay

## 2021-05-15 ENCOUNTER — Ambulatory Visit (HOSPITAL_BASED_OUTPATIENT_CLINIC_OR_DEPARTMENT_OTHER)
Admission: RE | Admit: 2021-05-15 | Discharge: 2021-05-15 | Disposition: A | Discharge status: Home / Self Care | Payer: Medicare Other | Source: Ambulatory Visit | Attending: Family Medicine | Admitting: Family Medicine

## 2021-05-15 DIAGNOSIS — R110 Nausea: Secondary | ICD-10-CM | POA: Diagnosis not present

## 2021-05-15 DIAGNOSIS — R748 Abnormal levels of other serum enzymes: Secondary | ICD-10-CM | POA: Diagnosis not present

## 2021-05-15 DIAGNOSIS — N281 Cyst of kidney, acquired: Secondary | ICD-10-CM | POA: Diagnosis not present

## 2021-05-16 ENCOUNTER — Telehealth: Payer: Self-pay

## 2021-05-16 DIAGNOSIS — R110 Nausea: Secondary | ICD-10-CM

## 2021-05-16 NOTE — Telephone Encounter (Signed)
-----   Message from [name], MD sent at 05/16/2021  8:09 AM EDT ----- Ultrasound is normal. No explanation for nausea. If he continues to have those symptoms then I would recommend referral to  a gastroenterologist.

## 2021-05-16 NOTE — Telephone Encounter (Signed)
Pt advised.  He would like to proceed with the referral to GI.    Pt states he is going out of town next week but will be back the week of 05/27/2021   Thanks,   -Mickel Baas

## 2021-06-02 DIAGNOSIS — T161XXA Foreign body in right ear, initial encounter: Secondary | ICD-10-CM | POA: Diagnosis not present

## 2021-07-17 ENCOUNTER — Encounter: Payer: Self-pay | Admitting: Gastroenterology

## 2021-07-17 ENCOUNTER — Other Ambulatory Visit: Payer: Self-pay

## 2021-07-17 ENCOUNTER — Ambulatory Visit (INDEPENDENT_AMBULATORY_CARE_PROVIDER_SITE_OTHER): Payer: Medicare Other | Admitting: Gastroenterology

## 2021-07-17 VITALS — BP 188/77 | HR 76 | Temp 98.3°F | Ht 69.0 in | Wt 206.0 lb

## 2021-07-17 DIAGNOSIS — R110 Nausea: Secondary | ICD-10-CM

## 2021-07-17 DIAGNOSIS — R748 Abnormal levels of other serum enzymes: Secondary | ICD-10-CM | POA: Diagnosis not present

## 2021-07-17 NOTE — Progress Notes (Signed)
Jonathon Bellows MD, MRCP(U.K) [address]  [place]  [place], [place] [address]  Main: [phone]  Fax: [phone]   Gastroenterology Consultation  Referring Provider:     [name], MD Primary Care Physician:  [name], MD Primary Gastroenterologist:  Dr. Jonathon Bellows  Reason for Consultation:   Referred for nausea        HPI:   James Compton is a 70 y.o. y/o male referred for consultation & management  by Dr. Caryn Section, Kirstie Peri, MD.    He has been referred for nausea.  05/15/2021 right upper quadrant ultrasound showed no abnormalities  May 2022 CMP demonstrated elevated alkaline phosphatase 161 total bilirubin of 1.4 normal AST and ALT hemoglobin normal TSH normal.Last colonoscopy in May 2016 by Dr. Allen Norris.  He states that he has occasional nausea.  Does not follow clear pattern.  Can start all of a sudden.  And resolve all of a sudden.  Denies any abdominal pain.  No new medications.  Denies any heartburn.  He is on Prilosec but do not take it regularly.  He has had abnormal alkaline phosphatase he recollects for more than 4 to 5 years.  No clear etiology was given.  Past Medical History:  Diagnosis Date   Arthritis    Osteo - knees   Complication of anesthesia    bowels "feel asleep" after knee surgery   GERD (gastroesophageal reflux disease)    occas   Hypertension    Pneumonia    PONV (postoperative nausea and vomiting)    Sleep apnea    Status post arthroscopy 04/11/2017   Right Knee May 2018 Dr. Marry Guan   TIA (transient ischemic attack) 11/03/2016   Vertigo 01/2011   1 time event, pt had drank several cups of coffee that day.   Wears hearing aid    bilateral   Wears partial dentures    upper    Past Surgical History:  Procedure Laterality Date   CHOLECYSTECTOMY  2007   COLONOSCOPY N/A 04/14/2015   Procedure: COLONOSCOPY;  Surgeon: Lucilla Lame, MD;  Location: [place];  Service: Gastroenterology;  Laterality: N/A;   KNEE ARTHROPLASTY  Right 11/30/2018   Procedure: COMPUTER ASSISTED TOTAL KNEE ARTHROPLASTY;  Surgeon: Dereck Leep, MD;  Location: [facility];  Service: Orthopedics;  Laterality: Right;   KNEE ARTHROSCOPY Right 2007   KNEE ARTHROSCOPY WITH MEDIAL MENISECTOMY  04/02/2017   Procedure: KNEE ARTHROSCOPY WITH MEDIAL MENISECTOMY CHONDRAPLASTY, SYNOVECTOMY;  Surgeon: Dereck Leep, MD;  Location: [facility];  Service: Orthopedics;;   POLYPECTOMY  04/14/2015   Procedure: POLYPECTOMY INTESTINAL;  Surgeon: Lucilla Lame, MD;  Location: [place];  Service: Gastroenterology;;    Prior to Admission medications   Medication Sig Start Date End Date Taking? Authorizing Provider  acetaminophen (TYLENOL) 500 MG tablet Take 1,000 mg by mouth every 8 (eight) hours as needed (pain).    [provider]  aspirin 325 MG EC tablet Take 325 mg by mouth daily.    [provider]  atorvastatin (LIPITOR) 80 MG tablet Take 1 tablet (80 mg total) by mouth every evening. 04/30/21   [name], MD  cetirizine (ZYRTEC) 10 MG tablet  02/07/20   [provider]  Coenzyme Q10 (COQ10) 100 MG CAPS Take 2 capsules by mouth daily.    [provider]  fluticasone (FLONASE) 50 MCG/ACT nasal spray Place 2 sprays into both nostrils daily as needed for allergies or rhinitis.  [provider]  gentamicin ointment (GARAMYCIN) 0.1 % Apply 1 application topically 2 (two) times daily. Apply to affected area 3 times a day 10/31/16   [provider]  ketoconazole (NIZORAL) 2 % cream Apply 1 application topically daily as needed for irritation. 04/02/21   [name], MD  losartan (COZAAR) 50 MG tablet Take 1 tablet (50 mg total) by mouth daily. 04/17/21 07/11/22  [name], MD  Multiple Vitamin (MULTIVITAMIN) capsule Take 1 capsule by mouth daily. PM    [provider]  omeprazole (PRILOSEC) 40 MG capsule Take 40 mg by mouth daily.    [provider]  Probiotic Product  (PROBIOTIC DAILY PO) Take 1 capsule by mouth daily. PM    [provider]  scopolamine (TRANSDERM-SCOP, 1.5 MG,) 1 MG/3DAYS Place 1 patch (1.5 mg total) onto the skin every 3 (three) days. 05/01/21   [name], MD    Family History  Problem Relation Age of Onset   Alzheimer's disease Mother    Diabetes Father        pre diabetic   Healthy Sister    Healthy Brother    Healthy Daughter    Healthy Son    Healthy Daughter      Social History   Tobacco Use   Smoking status: Former    Types: Cigarettes    Quit date: 11/25/1970    Years since quitting: 50.6   Smokeless tobacco: Never  Vaping Use   Vaping Use: Never used  Substance Use Topics   Alcohol use: No   Drug use: No    Allergies as of 07/17/2021 - Review Complete 07/17/2021  Allergen Reaction Noted   Dipyridamole  11/03/2018   Penicillins Nausea Only 04/06/2015    Review of Systems:    All systems reviewed and negative except where noted in HPI.   Physical Exam:  BP (!) 205/97   Pulse 65   Temp 98.3 F (36.8 C) (Oral)   Ht '5\' 9"'$  (1.753 m)   Wt 206 lb (93.4 kg)   BMI 30.42 kg/m  No LMP for male patient. Psych:  Alert and cooperative. Normal mood and affect. General:   Alert,  Well-developed, well-nourished, pleasant and cooperative in NAD Head:  Normocephalic and atraumatic. Eyes:  Sclera clear, no icterus.   Conjunctiva pink. Ears:  Normal auditory acuity. Neurologic:  Alert and oriented x3;  grossly normal neurologically. Skin:  Intact without significant lesions or rashes. No jaundice. Lymph Nodes:  No significant cervical adenopathy. Psych:  Alert and cooperative. Normal mood and affect.  Imaging Studies: No results found.  Assessment and Plan:   James Compton is a 70 y.o. y/o male has been referred for episodic nausea.  Does not follow a clear pattern.  Could be silent acid reflux.  We will treat empirically and see if he responds if not we will need an endoscopy evaluation.  In terms  of his elevated alkaline phosphatase I will order complete autoimmune and viral hepatitis panel.  We will also check a GGT to ensure that this is from the liver.  At times alkaline phosphatase could be related to the bone.  I will discuss all these results with him in 8 weeks time to go up with a plan. His blood pressure is elevated and he should follow-up with his PCP. Follow up in 8 weeks  Dr Jonathon Bellows MD,MRCP(U.K)

## 2021-07-19 ENCOUNTER — Other Ambulatory Visit: Payer: Self-pay

## 2021-07-19 ENCOUNTER — Ambulatory Visit (INDEPENDENT_AMBULATORY_CARE_PROVIDER_SITE_OTHER): Payer: Medicare Other | Admitting: Dermatology

## 2021-07-19 ENCOUNTER — Encounter: Payer: Self-pay | Admitting: Dermatology

## 2021-07-19 DIAGNOSIS — L570 Actinic keratosis: Secondary | ICD-10-CM | POA: Diagnosis not present

## 2021-07-19 DIAGNOSIS — L578 Other skin changes due to chronic exposure to nonionizing radiation: Secondary | ICD-10-CM | POA: Diagnosis not present

## 2021-07-19 NOTE — Progress Notes (Signed)
   Follow-Up Visit   Subjective  James Compton is a 70 y.o. male who presents for the following: Actinic Keratosis (6 month follow up - face, scalp treated with LN2 x 9).  The following portions of the chart were reviewed this encounter and updated as appropriate:   Tobacco  Allergies  Meds  Problems  Med Hx  Surg Hx  Fam Hx     Review of Systems:  No other skin or systemic complaints except as noted in HPI or Assessment and Plan.  Objective  Well appearing patient in no apparent distress; mood and affect are within normal limits.  A focused examination was performed including face. Relevant physical exam findings are noted in the Assessment and Plan.  Face (3) Erythematous thin papules/macules with gritty scale.    Assessment & Plan   Actinic Damage - chronic, secondary to cumulative UV radiation exposure/sun exposure over time - diffuse scaly erythematous macules with underlying dyspigmentation - Recommend daily broad spectrum sunscreen SPF 30+ to sun-exposed areas, reapply every 2 hours as needed.  - Recommend staying in the shade or wearing long sleeves, sun glasses (UVA+UVB protection) and wide brim hats (4-inch brim around the entire circumference of the hat). - Call for new or changing lesions.  AK (actinic keratosis) (3) Face  Destruction of lesion - Face Complexity: simple   Destruction method: cryotherapy   Informed consent: discussed and consent obtained   Timeout:  patient name, date of birth, surgical site, and procedure verified Lesion destroyed using liquid nitrogen: Yes   Region frozen until ice ball extended beyond lesion: Yes   Outcome: patient tolerated procedure well with no complications   Post-procedure details: wound care instructions given    Return in about 6 months (around 01/19/2022).  I, Ashok Cordia, CMA, am acting as scribe for Sarina Ser, MD . Documentation: I have reviewed the above documentation for accuracy and completeness, and I  agree with the above.  Sarina Ser, MD

## 2021-07-19 NOTE — Patient Instructions (Signed)

## 2021-07-20 LAB — MITOCHONDRIAL/SMOOTH MUSCLE AB PNL
Mitochondrial Ab: <20 Units (ref 0.0–20.0)
Smooth Muscle Ab: 12 Units (ref 0–19)

## 2021-07-20 LAB — PTH, INTACT AND CALCIUM
Calcium: 9 mg/dL (ref 8.6–10.2)
PTH: 20 pg/mL (ref 15–65)

## 2021-07-20 LAB — IMMUNOGLOBULINS A/E/G/M, SERUM
IgA/Immunoglobulin A, Serum: 190 mg/dL (ref 61–437)
IgE (Immunoglobulin E), Serum: 111 IU/mL (ref 6–495)
IgG (Immunoglobin G), Serum: 978 mg/dL (ref 603–1613)
IgM (Immunoglobulin M), Srm: 50 mg/dL (ref 15–143)

## 2021-07-20 LAB — IRON,TIBC AND FERRITIN PANEL
Ferritin: 116 ng/mL (ref 30–400)
Iron Saturation: 29 % (ref 15–55)
Iron: 91 ug/dL (ref 38–169)
Total Iron Binding Capacity: 309 ug/dL (ref 250–450)
UIBC: 218 ug/dL (ref 111–343)

## 2021-07-20 LAB — ALPHA-1-ANTITRYPSIN: A-1 Antitrypsin: 135 mg/dL (ref 101–187)

## 2021-07-20 LAB — HEPATITIS B SURFACE ANTIGEN: Hepatitis B Surface Ag: NEGATIVE

## 2021-07-20 LAB — GAMMA GT: GGT: 48 IU/L (ref 0–65)

## 2021-07-20 LAB — HEPATITIS A ANTIBODY, TOTAL: hep A Total Ab: POSITIVE — AB

## 2021-07-20 LAB — HEPATITIS B CORE ANTIBODY, TOTAL: Hep B Core Total Ab: NEGATIVE

## 2021-07-20 LAB — HEPATITIS B SURFACE ANTIBODY,QUALITATIVE: Hep B Surface Ab, Qual: NONREACTIVE

## 2021-07-20 LAB — CERULOPLASMIN: Ceruloplasmin: 12 mg/dL — ABNORMAL LOW (ref 16.0–31.0)

## 2021-07-20 LAB — ANA: Anti Nuclear Antibody (ANA): POSITIVE — AB

## 2021-07-20 LAB — ANTI-MICROSOMAL ANTIBODY LIVER / KIDNEY: LKM1 Ab: 1.1 Units (ref 0.0–20.0)

## 2021-07-20 LAB — CELIAC DISEASE AB SCREEN W/RFX
Antigliadin Abs, IgA: 3 units (ref 0–19)
Transglutaminase IgA: <2 U/mL (ref 0–3)

## 2021-07-20 LAB — HEPATITIS B E ANTIBODY: Hep B E Ab: NEGATIVE

## 2021-07-20 LAB — HEPATITIS C ANTIBODY: Hep C Virus Ab: <0.1 s/co ratio (ref 0.0–0.9)

## 2021-07-20 LAB — HEPATITIS B E ANTIGEN: Hep B E Ag: NEGATIVE

## 2021-07-20 LAB — CK: Total CK: 183 U/L (ref 41–331)

## 2021-07-31 DIAGNOSIS — H2513 Age-related nuclear cataract, bilateral: Secondary | ICD-10-CM | POA: Diagnosis not present

## 2021-07-31 DIAGNOSIS — H25013 Cortical age-related cataract, bilateral: Secondary | ICD-10-CM | POA: Diagnosis not present

## 2021-07-31 DIAGNOSIS — H43813 Vitreous degeneration, bilateral: Secondary | ICD-10-CM | POA: Diagnosis not present

## 2021-07-31 DIAGNOSIS — H524 Presbyopia: Secondary | ICD-10-CM | POA: Diagnosis not present

## 2021-08-20 DIAGNOSIS — H25812 Combined forms of age-related cataract, left eye: Secondary | ICD-10-CM | POA: Diagnosis not present

## 2021-08-20 DIAGNOSIS — Z01818 Encounter for other preprocedural examination: Secondary | ICD-10-CM | POA: Diagnosis not present

## 2021-08-20 DIAGNOSIS — H25811 Combined forms of age-related cataract, right eye: Secondary | ICD-10-CM | POA: Diagnosis not present

## 2021-08-25 ENCOUNTER — Other Ambulatory Visit: Payer: Self-pay | Admitting: Family Medicine

## 2021-08-28 ENCOUNTER — Telehealth: Payer: Self-pay

## 2021-08-28 NOTE — Telephone Encounter (Signed)
Patient verbalized understanding of results. He states he is on vacation right now but will come pick up the urine collection next week. He will call and make a eye doctor appointment.

## 2021-08-28 NOTE — Telephone Encounter (Signed)
Called and left a message for call back order 24 hour urine copper test.

## 2021-08-28 NOTE — Telephone Encounter (Signed)
-----   Message from Jonathon Bellows, MD sent at 08/26/2021  5:42 PM EDT ----- 1. Needs hep B vaccine 2. Ceruloplasmin was low needs 24 hour urinary copper and ophthalmology exam of the eye to "rule out presence of KF ring"

## 2021-09-03 ENCOUNTER — Telehealth: Payer: Self-pay

## 2021-09-03 DIAGNOSIS — R768 Other specified abnormal immunological findings in serum: Secondary | ICD-10-CM

## 2021-09-03 NOTE — Telephone Encounter (Signed)
I don't think so. Here is what the Medicare.gov says:  Hepatitis B shots Medicare Part B [employer]) covers these preventive shots if you're at medium or high risk for Hepatitis B. Your risk for Hepatitis B increases if one or more of these conditions applies:      You have hemophilia.     You have End-Stage Renal Disease (Esrd)     .     You have diabetes.     You live with someone who has Hepatitis B.     You're a health care worker and have frequent contact with blood or body fluids.

## 2021-09-03 NOTE — Telephone Encounter (Signed)
Copied from [place] [id number]. Topic: General - Other >> Sep 03, 2021 10:59 AM Celene Kras wrote: Reason for CRM: Pt called stating that he is needing to have a Hep B shot. He states that he checked with local pharmacy and his insurance will not cover it there. He is requesting to see if it will be covered in office. Please advise.

## 2021-09-04 NOTE — Telephone Encounter (Signed)
We could give him hepatitis B, I just can't promise Medicare will pay for it. It might be covered by his Medicare Part D plan in which case we could give him a prescription to take to his pharmacy. There is more than one hepatitis B vaccine, so he would need to check with his pharmacy to see which one they have.

## 2021-09-04 NOTE — Telephone Encounter (Signed)
Patient advised of message below. He states his GI doctor told him that he needed a  Hepatitis B vaccine because he tested positive for Hepatitis A. Patient states that his GI doctor found a few problems that she is concerned about and is doing further testing. GI notes and labs are in patient's chart.

## 2021-09-05 MED ORDER — HEPATITIS B VAC RECOMB ADJ 20 MCG/0.5ML IM SOSY: 0.5000 mL | PREFILLED_SYRINGE | Freq: Once | INTRAMUSCULAR | 1 refills | Status: AC

## 2021-09-05 NOTE — Telephone Encounter (Signed)
Patient advised.

## 2021-09-05 NOTE — Addendum Note (Signed)
Addended by: [name] on: 09/05/2021 10:29 AM   Modules accepted: Orders

## 2021-09-05 NOTE — Telephone Encounter (Signed)
Patient has been advised and states that he spoke with his pharmacist who advised him that they have the two shot series Hep B vaccine. Patient is [facility] [address] so [facility] get [facility]. KW

## 2021-09-05 NOTE — Telephone Encounter (Signed)
Ok, [facility]

## 2021-09-06 DIAGNOSIS — H2511 Age-related nuclear cataract, right eye: Secondary | ICD-10-CM | POA: Diagnosis not present

## 2021-09-06 DIAGNOSIS — H25811 Combined forms of age-related cataract, right eye: Secondary | ICD-10-CM | POA: Diagnosis not present

## 2021-09-11 ENCOUNTER — Ambulatory Visit (INDEPENDENT_AMBULATORY_CARE_PROVIDER_SITE_OTHER): Payer: Medicare Other | Admitting: Gastroenterology

## 2021-09-11 ENCOUNTER — Other Ambulatory Visit: Payer: Self-pay

## 2021-09-11 ENCOUNTER — Encounter: Payer: Self-pay | Admitting: Gastroenterology

## 2021-09-11 DIAGNOSIS — R748 Abnormal levels of other serum enzymes: Secondary | ICD-10-CM

## 2021-09-11 NOTE — Progress Notes (Signed)
Jonathon Bellows MD, MRCP(U.K) [address]  [place]  [place], [place] [address]  Main: [phone]  Fax: [phone]   Primary Care Physician: [name], MD  Primary Gastroenterologist:  Dr. Jonathon Bellows    Chief complaint follow-up for nausea and elevated alkaline phosphatase  HPI: James Compton is a 70 y.o. male  Summary of history : Initially seen and referred for nausea on 07/17/2021.  05/15/2021 right upper quadrant ultrasound showed no abnormalities  May 2022 CMP demonstrated elevated alkaline phosphatase 161 total bilirubin of 1.4 normal AST and ALT hemoglobin normal TSH normal.Last colonoscopy in May 2016 by Dr. Allen Norris.   He states that he has occasional nausea.  Does not follow clear pattern.  Can start all of a sudden.  And resolve all of a sudden.  Denies any abdominal pain.  No new medications.  Denies any heartburn.  He is on Prilosec but do not take it regularly.  He has had abnormal alkaline phosphatase he recollects for more than 4 to 5 years.  No clear etiology was given.     Interval history   07/17/2021-09/11/2021  07/17/2021: PTH, calcium was normal.  GGT 48 normal.  Not immune to hepatitis B and ceruloplasmin level was low requiring 24-hour urinary copper and ophthalmology exam to rule out KF rings.  Immune to hepatitis A.  ANA positive smooth muscle antibody negative ceruloplasmin 12 iron studies normal immunoglobulins normal celiac serology negative.  Antimitochondrial antibody negative He states that he has had a recent ophthalmic exam and no abnormalities were noted. Current Outpatient Medications  Medication Sig Dispense Refill   acetaminophen (TYLENOL) 500 MG tablet Take 1,000 mg by mouth every 8 (eight) hours as needed (pain).     aspirin 325 MG EC tablet Take 325 mg by mouth daily.     atorvastatin (LIPITOR) 80 MG tablet TAKE 1 TABLET BY MOUTH EVERY EVENING 90 tablet 1   Coenzyme Q10 (COQ10) 100 MG CAPS Take 2 capsules by mouth daily.      fluticasone (FLONASE) 50 MCG/ACT nasal spray Place 2 sprays into both nostrils daily as needed for allergies or rhinitis.      gentamicin ointment (GARAMYCIN) 0.1 % Apply 1 application topically 2 (two) times daily. Apply to affected area 3 times a day     ketoconazole (NIZORAL) 2 % cream Apply 1 application topically daily as needed for irritation. 15 g 1   losartan (COZAAR) 50 MG tablet Take 1 tablet (50 mg total) by mouth daily. 90 tablet 4   Multiple Vitamin (MULTIVITAMIN) capsule Take 1 capsule by mouth daily. PM     omeprazole (PRILOSEC) 40 MG capsule Take 40 mg by mouth daily.     Probiotic Product (PROBIOTIC DAILY PO) Take 1 capsule by mouth daily. PM     cetirizine (ZYRTEC) 10 MG tablet  (Patient not taking: No sig reported)     No current facility-administered medications for this visit.    Allergies as of 09/11/2021 - Review Complete 09/11/2021  Allergen Reaction Noted   Dipyridamole  11/03/2018   Penicillins Nausea Only 04/06/2015    ROS:  General: Negative for anorexia, weight loss, fever, chills, fatigue, weakness. ENT: Negative for hoarseness, difficulty swallowing , nasal congestion. CV: Negative for chest pain, angina, palpitations, dyspnea on exertion, peripheral edema.  Respiratory: Negative for dyspnea at rest, dyspnea on exertion, cough, sputum, wheezing.  GI: See history of present illness. GU:  Negative for dysuria, hematuria, urinary incontinence, urinary frequency, nocturnal urination.  Endo: Negative for unusual weight change.    Physical Examination:   BP (!) 173/77 (BP Location: Left Arm, Patient Position: Sitting, Cuff Size: Large)   Pulse 65   Temp 97.8 F (36.6 C) (Oral)   Ht 5\' 9"  (1.753 m)   Wt 208 lb 6.4 oz (94.5 kg)   BMI 30.78 kg/m   General: Well-nourished, well-developed in no acute distress.  Eyes: No icterus. Conjunctivae pink. Neuro: Alert and oriented x 3.  Grossly intact. Skin: Warm and dry, no jaundice.   Psych: Alert and  cooperative, normal mood and affect.   Imaging Studies: No results found.  Assessment and Plan:   James Compton is a 70 y.o. y/o male here to follow-up for episodic nausea and elevated alkaline phosphatase.  Does not follow a clear pattern.  Could be silent acid reflux.  We will treat empirically and see if he responds if not we will need an endoscopy evaluation.  In terms of his elevated alkaline phosphatase with gamma GT being negative it makes the elevated alkaline phosphatase less likely from the liver.  With PTH and calcium being normal makes it less likely from his bones although elevated alkaline phosphatase can be seen in Paget's disease.  He stated had a recent ophthalmology exam which showed no abnormalities  Plan  Follow-up and 24-hour urinary copper    Since Gamma GT is negative the elevated alkaline phosphatase is not related to the liver.  Recommend checking every 6 monthly along with, GGT Needs hepatitis B vaccination which she will receive the first dose  Dr Jonathon Bellows  MD,MRCP [facility]) Follow up in 3 months

## 2021-09-12 DIAGNOSIS — G4733 Obstructive sleep apnea (adult) (pediatric): Secondary | ICD-10-CM | POA: Diagnosis not present

## 2021-09-12 DIAGNOSIS — Z8673 Personal history of transient ischemic attack (TIA), and cerebral infarction without residual deficits: Secondary | ICD-10-CM | POA: Diagnosis not present

## 2021-09-12 DIAGNOSIS — I10 Essential (primary) hypertension: Secondary | ICD-10-CM | POA: Diagnosis not present

## 2021-09-13 LAB — COPPER, URINE - RANDOM OR 24 HOUR
Copper / Creatinine Ratio: 10 ug/g creat (ref 0–49)
Copper, 24H Ur: 14 ug/24 hr (ref 3–35)
Copper, Ur: 7 ug/L
Creatinine(Crt),U: 0.67 g/L (ref 0.30–3.00)

## 2021-09-20 DIAGNOSIS — H25812 Combined forms of age-related cataract, left eye: Secondary | ICD-10-CM | POA: Diagnosis not present

## 2021-10-03 ENCOUNTER — Ambulatory Visit: Payer: Medicare Other | Admitting: Family Medicine

## 2021-10-06 DIAGNOSIS — U071 COVID-19: Secondary | ICD-10-CM | POA: Diagnosis not present

## 2021-10-11 DIAGNOSIS — K219 Gastro-esophageal reflux disease without esophagitis: Secondary | ICD-10-CM | POA: Diagnosis not present

## 2021-10-24 DIAGNOSIS — Z961 Presence of intraocular lens: Secondary | ICD-10-CM | POA: Diagnosis not present

## 2021-10-24 DIAGNOSIS — Z9841 Cataract extraction status, right eye: Secondary | ICD-10-CM | POA: Diagnosis not present

## 2021-10-24 DIAGNOSIS — Z9842 Cataract extraction status, left eye: Secondary | ICD-10-CM | POA: Diagnosis not present

## 2021-11-05 ENCOUNTER — Other Ambulatory Visit: Payer: Self-pay

## 2021-11-05 ENCOUNTER — Ambulatory Visit (INDEPENDENT_AMBULATORY_CARE_PROVIDER_SITE_OTHER): Payer: Medicare Other | Admitting: Family Medicine

## 2021-11-05 ENCOUNTER — Encounter: Payer: Self-pay | Admitting: Family Medicine

## 2021-11-05 VITALS — BP 137/68 | HR 65 | Temp 97.9°F | Wt 208.0 lb

## 2021-11-05 DIAGNOSIS — Z23 Encounter for immunization: Secondary | ICD-10-CM | POA: Diagnosis not present

## 2021-11-05 DIAGNOSIS — I10 Essential (primary) hypertension: Secondary | ICD-10-CM | POA: Diagnosis not present

## 2021-11-05 DIAGNOSIS — E785 Hyperlipidemia, unspecified: Secondary | ICD-10-CM

## 2021-11-05 DIAGNOSIS — M7122 Synovial cyst of popliteal space [Baker], left knee: Secondary | ICD-10-CM

## 2021-11-05 MED ORDER — HYDROCHLOROTHIAZIDE 12.5 MG PO TABS
12.5000 mg | ORAL_TABLET | Freq: Every day | ORAL | 2 refills | Status: DC
Start: 2021-11-05 — End: 2021-12-28

## 2021-11-05 NOTE — Progress Notes (Signed)
Established patient visit   Patient: James Compton   DOB: 04-03-1951   70 y.o. Male  MRN: [id number] Visit Date: 11/05/2021  Today's healthcare provider: Lelon Huh, MD   Chief Complaint  Patient presents with   Hypertension   Hyperlipidemia   Leg Pain   Subjective    Leg Pain  The incident occurred more than 1 week ago. There was no injury mechanism. The pain is present in the left leg. Pertinent negatives include no inability to bear weight, loss of motion, loss of sensation, muscle weakness, numbness or tingling. The symptoms are aggravated by movement. He has tried NSAIDs for the symptoms. The treatment provided moderate relief.   Hypertension, follow-up  BP Readings from Last 3 Encounters:  11/05/21 137/68  09/11/21 (!) 173/77  07/17/21 (!) 188/77   Wt Readings from Last 3 Encounters:  11/05/21 208 lb (94.3 kg)  09/11/21 208 lb 6.4 oz (94.5 kg)  07/17/21 206 lb (93.4 kg)     He was last seen for hypertension 6 months ago.  BP at that visit was 136/66. Management since that visit includes no changes.  He reports excellent compliance with treatment. He is not having side effects.  He is following a Low Sodium diet. He is exercising. He does not smoke.  Use of agents associated with hypertension: none.   Outside blood pressures are 130-140's/60-70's.  --------------------------------------------------------------------------------------------------- Lipid/Cholesterol, Follow-up  Last lipid panel Other pertinent labs  Lab Results  Component Value Date   CHOL 102 04/02/2021   HDL 41 04/02/2021   LDLCALC 40 04/02/2021   TRIG 118 04/02/2021   CHOLHDL 2.5 04/02/2021   Lab Results  Component Value Date   ALT 22 04/02/2021   AST 32 04/02/2021   PLT 184 04/02/2021   TSH 1.840 04/02/2021     He was last seen for this 6 months ago.  Management since that visit includes no changes.  He reports excellent compliance with treatment. He is not having side  effects.   Symptoms: No chest pain No chest pressure/discomfort  No dyspnea No lower extremity edema  No numbness or tingling of extremity No orthopnea  No palpitations No paroxysmal nocturnal dyspnea  No speech difficulty No syncope   Current diet: not asked Current exercise:  plays golf  The ASCVD Risk score (Arnett DK, et al., 2019) failed to calculate for the following reasons:   The valid total cholesterol range is 130 to 320 mg/dL  ---------------------------------------------------------------------------------------------------     Medications: Outpatient Medications Prior to Visit  Medication Sig   acetaminophen (TYLENOL) 500 MG tablet Take 1,000 mg by mouth every 8 (eight) hours as needed (pain).   aspirin 325 MG EC tablet Take 325 mg by mouth daily.   atorvastatin (LIPITOR) 80 MG tablet TAKE 1 TABLET BY MOUTH EVERY EVENING   cetirizine (ZYRTEC) 10 MG tablet  (Patient not taking: No sig reported)   Coenzyme Q10 (COQ10) 100 MG CAPS Take 2 capsules by mouth daily.   fluticasone (FLONASE) 50 MCG/ACT nasal spray Place 2 sprays into both nostrils daily as needed for allergies or rhinitis.    gentamicin ointment (GARAMYCIN) 0.1 % Apply 1 application topically 2 (two) times daily. Apply to affected area 3 times a day   ketoconazole (NIZORAL) 2 % cream Apply 1 application topically daily as needed for irritation.   losartan (COZAAR) 50 MG tablet Take 1 tablet (50 mg total) by mouth daily.   Multiple Vitamin (MULTIVITAMIN) capsule Take 1 capsule by  mouth daily. PM   omeprazole (PRILOSEC) 40 MG capsule Take 40 mg by mouth daily.   Probiotic Product (PROBIOTIC DAILY PO) Take 1 capsule by mouth daily. PM   No facility-administered medications prior to visit.    Review of Systems  Constitutional: Negative.   Respiratory: Negative.    Cardiovascular:  Positive for leg swelling (Left leg). Negative for chest pain and palpitations.  Gastrointestinal: Negative.   Musculoskeletal:   Positive for joint swelling (Left knee). Negative for arthralgias, back pain, gait problem, myalgias, neck pain and neck stiffness.  Skin:  Negative for color change.  Neurological:  Negative for dizziness, tingling, syncope, speech difficulty, numbness and headaches.      Objective    BP 137/68 (BP Location: Right Arm, Patient Position: Sitting, Cuff Size: Large)   Pulse 65   Temp 97.9 F (36.6 C) (Oral)   Wt 208 lb (94.3 kg)   SpO2 98%   BMI 30.72 kg/m    Physical Exam   Slight tenderness left lateral knee, popliteal cyst palpated, non tender, no erythema.     Assessment & Plan     1. Essential hypertension Improving on losartan, but home SBP still frequently above 140. Will add hydrochlorothiazide (HYDRODIURIL) 12.5 MG tablet; Take 1 tablet (12.5 mg total) by mouth daily.  Dispense: 30 tablet; Refill: 2  Discussed change to losartan/hctz if BP well controlled at follow up in about 2 months.   2. Hyperlipidemia, unspecified hyperlipidemia type He is tolerating atorvastatin well with no adverse effects.    3. Need for influenza vaccination  - Flu Vaccine QUAD High Dose(Fluad)  4. Baker's cyst of knee, left Associated with leg pain that improves with ibuprofen, advised he could continue ibuprofen a few times a day and ice 2-3 times a day. Consider prescription NSAID  Future Appointments  Date Time Provider [place]  12/28/2021  9:40 AM [name], MD [facility]  01/10/2022  1:30 PM Jonathon Bellows, MD [facility] None  01/17/2022 10:00 AM Ralene Bathe, MD [facility] None        The entirety of the information documented in the History of Present Illness, Review of Systems and Physical Exam were personally obtained by me. Portions of this information were initially documented by the CMA and reviewed by me for thoroughness and accuracy.     Lelon Huh, MD  [facility] [phone] (phone) [phone] (fax)  [place]

## 2021-11-08 DIAGNOSIS — M25562 Pain in left knee: Secondary | ICD-10-CM | POA: Diagnosis not present

## 2021-11-08 DIAGNOSIS — M1712 Unilateral primary osteoarthritis, left knee: Secondary | ICD-10-CM | POA: Diagnosis not present

## 2021-11-12 DIAGNOSIS — M25562 Pain in left knee: Secondary | ICD-10-CM | POA: Diagnosis not present

## 2021-11-14 DIAGNOSIS — M25562 Pain in left knee: Secondary | ICD-10-CM | POA: Diagnosis not present

## 2021-11-14 DIAGNOSIS — M1712 Unilateral primary osteoarthritis, left knee: Secondary | ICD-10-CM | POA: Diagnosis not present

## 2021-11-20 DIAGNOSIS — M1712 Unilateral primary osteoarthritis, left knee: Secondary | ICD-10-CM | POA: Diagnosis not present

## 2021-11-20 DIAGNOSIS — M25562 Pain in left knee: Secondary | ICD-10-CM | POA: Diagnosis not present

## 2021-11-22 DIAGNOSIS — M25562 Pain in left knee: Secondary | ICD-10-CM | POA: Diagnosis not present

## 2021-11-22 DIAGNOSIS — M1712 Unilateral primary osteoarthritis, left knee: Secondary | ICD-10-CM | POA: Diagnosis not present

## 2021-12-06 ENCOUNTER — Telehealth: Payer: Self-pay | Admitting: *Deleted

## 2021-12-06 ENCOUNTER — Telehealth: Payer: Self-pay

## 2021-12-06 MED ORDER — MELOXICAM 15 MG PO TABS: 15.0000 mg | ORAL_TABLET | Freq: Every day | ORAL | 1 refills | Status: DC | PRN

## 2021-12-06 NOTE — Telephone Encounter (Signed)
Pt advised.  He states he will stop taking it everyday.  He was wanting to know if it was still safe to take asa 325mg  daily?  Thanks,   -Mickel Baas

## 2021-12-06 NOTE — Telephone Encounter (Signed)
Copied from [place] [id number]. Topic: General - Inquiry >> Dec 06, 2021  9:28 AM Greggory Keen D wrote: Reason for CRM: Pt called saying he went to the walk in clinic on 12/19 and the gave him mobic for knee pain.  He said as long he is taking mobic his knee is better.  He seen [facility] and they said he had arthritis.  They did an [place] and Xray, no baker cyst.  He is asking if Dr. Caryn Section will give him a refill on the Mobic/ or start prescribing it for him.  [facility].  CB#  [phone]

## 2021-12-06 NOTE — Telephone Encounter (Signed)
Pt advised.   Thanks,   -Blain Hunsucker  

## 2021-12-06 NOTE — Telephone Encounter (Signed)
Copied from [id number]. Topic: General - Inquiry >> Dec 06, 2021  9:28 AM Greggory Keen D wrote: Reason for CRM: Pt called saying he went to the walk in clinic on 12/19 and the gave him mobic for knee pain.  He said as long he is taking mobic his knee is better.  He seen [facility] and they said he had arthritis.  They did an [place] and Xray, no baker cyst.  He is asking if Dr. Caryn Section will give him a refill on the Mobic/ or start prescribing it for him.  [facility].  CB#  [phone]

## 2021-12-06 NOTE — Telephone Encounter (Signed)
Please advise 

## 2021-12-06 NOTE — Telephone Encounter (Signed)
Its OK to take a daily aspirin, but usually only recommend 81mg  a day unless told otherwise by neurologist or cardiologist.

## 2021-12-06 NOTE — Telephone Encounter (Signed)
[facility] It should only be taking a few days at a time as needed. It's not good for kidneys to it every single day.

## 2021-12-28 ENCOUNTER — Other Ambulatory Visit: Payer: Self-pay

## 2021-12-28 ENCOUNTER — Ambulatory Visit (INDEPENDENT_AMBULATORY_CARE_PROVIDER_SITE_OTHER): Payer: Medicare Other | Admitting: Family Medicine

## 2021-12-28 ENCOUNTER — Encounter: Payer: Self-pay | Admitting: Family Medicine

## 2021-12-28 VITALS — BP 120/64 | HR 66 | Temp 97.9°F | Resp 18 | Wt 207.0 lb

## 2021-12-28 DIAGNOSIS — I10 Essential (primary) hypertension: Secondary | ICD-10-CM

## 2021-12-28 MED ORDER — LOSARTAN POTASSIUM-HCTZ 50-12.5 MG PO TABS: 1.0000 | ORAL_TABLET | Freq: Every day | ORAL | 3 refills | Status: DC

## 2021-12-28 NOTE — Progress Notes (Signed)
Established patient visit   Patient: James Compton   DOB: [birth year]   71 y.o. Male  MRN: [id number] Visit Date: 12/28/2021  Today's healthcare provider: Lelon Huh, MD   Chief Complaint  Patient presents with   Hypertension   Subjective    HPI  Hypertension, follow-up  BP Readings from Last 3 Encounters:  12/28/21 120/64  11/05/21 137/68  09/11/21 (!) 173/77   Wt Readings from Last 3 Encounters:  12/28/21 207 lb (93.9 kg)  11/05/21 208 lb (94.3 kg)  09/11/21 208 lb 6.4 oz (94.5 kg)     He was last seen for hypertension on 11/05/2021.   BP at that visit was 137/68. Management since that visit includes adding hydrochlorothiazide (HYDRODIURIL) 12.5 MG tablet due to elevated home SBPs; Take 1 tablet (12.5 mg total) by mouth daily. Discussed change to losartan/hctz if BP well controlled at follow up in about 2 months.   He reports good compliance with treatment. He is not having side effects.  He is following a Regular diet. He is exercising. He does not smoke.  Use of agents associated with hypertension: none.   Outside blood pressures are 123-143/ 69-79. Symptoms: No chest pain No chest pressure  No palpitations No syncope  No dyspnea No orthopnea  No paroxysmal nocturnal dyspnea No lower extremity edema   Pertinent labs: Lab Results  Component Value Date   CHOL 102 04/02/2021   HDL 41 04/02/2021   LDLCALC 40 04/02/2021   TRIG 118 04/02/2021   CHOLHDL 2.5 04/02/2021   Lab Results  Component Value Date   NA 142 04/02/2021   K 4.3 04/02/2021   CREATININE 0.80 04/02/2021   EGFR 94 04/02/2021   GLUCOSE 101 (H) 04/02/2021   TSH 1.840 04/02/2021     The ASCVD Risk score (Arnett DK, et al., 2019) failed to calculate for the following reasons:   The valid total cholesterol range is 130 to 320 mg/dL   ---------------------------------------------------------------------------------------------------   Medications: Outpatient Medications Prior to  Visit  Medication Sig   acetaminophen (TYLENOL) 500 MG tablet Take 1,000 mg by mouth every 8 (eight) hours as needed (pain).   aspirin 325 MG EC tablet Take 325 mg by mouth daily.   atorvastatin (LIPITOR) 80 MG tablet TAKE 1 TABLET BY MOUTH EVERY EVENING   cetirizine (ZYRTEC) 10 MG tablet    Coenzyme Q10 (COQ10) 100 MG CAPS Take 2 capsules by mouth daily.   fluticasone (FLONASE) 50 MCG/ACT nasal spray Place 2 sprays into both nostrils daily as needed for allergies or rhinitis.    gentamicin ointment (GARAMYCIN) 0.1 % Apply 1 application topically 2 (two) times daily. Apply to affected area 3 times a day   hydrochlorothiazide (HYDRODIURIL) 12.5 MG tablet Take 1 tablet (12.5 mg total) by mouth daily.   ketoconazole (NIZORAL) 2 % cream Apply 1 application topically daily as needed for irritation.   losartan (COZAAR) 50 MG tablet Take 1 tablet (50 mg total) by mouth daily.   meloxicam (MOBIC) 15 MG tablet Take 1 tablet (15 mg total) by mouth daily as needed for pain.   Multiple Vitamin (MULTIVITAMIN) capsule Take 1 capsule by mouth daily. PM   omeprazole (PRILOSEC) 40 MG capsule Take 40 mg by mouth daily.   Probiotic Product (PROBIOTIC DAILY PO) Take 1 capsule by mouth daily. PM   No facility-administered medications prior to visit.    Review of Systems  Constitutional:  Negative for appetite change, chills and fever.  Respiratory:  Negative for chest tightness, shortness of breath and wheezing.   Cardiovascular:  Negative for chest pain and palpitations.  Gastrointestinal:  Negative for abdominal pain, nausea and vomiting.      Objective    BP 120/64 (BP Location: Left Arm, Patient Position: Sitting, Cuff Size: Large)    Pulse 66    Temp 97.9 F (36.6 C) (Oral)    Resp 18    Wt 207 lb (93.9 kg)    SpO2 100% Comment: room air   BMI 30.57 kg/m  {Show previous vital signs (optional):23777}  Physical Exam  General appearance: Mildly obese male, cooperative and in no acute distress Head:  Normocephalic, without obvious abnormality, atraumatic Respiratory: Respirations even and unlabored, normal respiratory rate Extremities: All extremities are intact.  Skin: Skin color, texture, turgor normal. No rashes seen  Psych: Appropriate mood and affect. Neurologic: Mental status: Alert, oriented to person, place, and time, thought content appropriate.     Assessment & Plan     1. Essential hypertension Home BP doing considerably better with addition of hctz to losartan. Will change to combo tablet - losartan-hydrochlorothiazide (HYZAAR) 50-12.5 MG tablet; Take 1 tablet by mouth daily.  Dispense: 90 tablet; Refill: 3   Follow up for AWV and follow up in May     The entirety of the information documented in the History of Present Illness, Review of Systems and Physical Exam were personally obtained by me. Portions of this information were initially documented by the CMA and reviewed by me for thoroughness and accuracy.     Lelon Huh, MD  [facility] [phone] (phone) [phone] (fax)  [place]

## 2022-01-07 ENCOUNTER — Encounter: Payer: Self-pay | Admitting: Dermatology

## 2022-01-07 ENCOUNTER — Ambulatory Visit (INDEPENDENT_AMBULATORY_CARE_PROVIDER_SITE_OTHER): Payer: Medicare Other | Admitting: Dermatology

## 2022-01-07 ENCOUNTER — Other Ambulatory Visit: Payer: Self-pay

## 2022-01-07 DIAGNOSIS — L570 Actinic keratosis: Secondary | ICD-10-CM | POA: Diagnosis not present

## 2022-01-07 DIAGNOSIS — D692 Other nonthrombocytopenic purpura: Secondary | ICD-10-CM

## 2022-01-07 DIAGNOSIS — L578 Other skin changes due to chronic exposure to nonionizing radiation: Secondary | ICD-10-CM

## 2022-01-07 DIAGNOSIS — L821 Other seborrheic keratosis: Secondary | ICD-10-CM

## 2022-01-07 NOTE — Progress Notes (Signed)
° °  Follow-Up Visit   Subjective  James Compton is a 71 y.o. male who presents for the following: Actinic Keratosis (Of the face - check for new or persistent skin lesions on sun exposed areas of the skin.). The patient has spots, moles and lesions to be evaluated, some may be new or changing.  The following portions of the chart were reviewed this encounter and updated as appropriate:   Tobacco   Allergies   Meds   Problems   Med Hx   Surg Hx   Fam Hx      Review of Systems:  No other skin or systemic complaints except as noted in HPI or Assessment and Plan.  Objective  Well appearing patient in no apparent distress; mood and affect are within normal limits.  A focused examination was performed including the face, ears, scalp, arms, hands. Relevant physical exam findings are noted in the Assessment and Plan.  Temples x 2 (2) Erythematous thin papules/macules with gritty scale.    Assessment & Plan  AK (actinic keratosis) (2) Temples x 2  Destruction of lesion - Temples x 2 Complexity: simple   Destruction method: cryotherapy   Informed consent: discussed and consent obtained   Timeout:  patient name, date of birth, surgical site, and procedure verified Lesion destroyed using liquid nitrogen: Yes   Region frozen until ice ball extended beyond lesion: Yes   Outcome: patient tolerated procedure well with no complications   Post-procedure details: wound care instructions given    Actinic Damage - chronic, secondary to cumulative UV radiation exposure/sun exposure over time - diffuse scaly erythematous macules with underlying dyspigmentation - Recommend daily broad spectrum sunscreen SPF 30+ to sun-exposed areas, reapply every 2 hours as needed.  - Recommend staying in the shade or wearing long sleeves, sun glasses (UVA+UVB protection) and wide brim hats (4-inch brim around the entire circumference of the hat). - Call for new or changing lesions.  Seborrheic Keratoses - Stuck-on,  waxy, tan-brown papules and/or plaques  - Benign-appearing - Discussed benign etiology and prognosis. - Observe - Call for any changes  Purpura - Chronic; persistent and recurrent.  Treatable, but not curable. - Violaceous macules and patches - Benign - Related to trauma, age, sun damage and/or use of blood thinners, chronic use of topical and/or oral steroids - Observe - Can use OTC arnica containing moisturizer such as Dermend Bruise Formula if desired - Call for worsening or other concerns  Return in about 8 months (around 09/06/2022) for TBSE.  Luther Redo, CMA, am acting as scribe for Sarina Ser, MD . Documentation: I have reviewed the above documentation for accuracy and completeness, and I agree with the above.  Sarina Ser, MD

## 2022-01-07 NOTE — Patient Instructions (Signed)

## 2022-01-09 ENCOUNTER — Other Ambulatory Visit: Payer: Self-pay

## 2022-01-09 DIAGNOSIS — E119 Type 2 diabetes mellitus without complications: Secondary | ICD-10-CM | POA: Insufficient documentation

## 2022-01-10 ENCOUNTER — Other Ambulatory Visit: Payer: Self-pay

## 2022-01-10 ENCOUNTER — Ambulatory Visit (INDEPENDENT_AMBULATORY_CARE_PROVIDER_SITE_OTHER): Payer: Medicare Other | Admitting: Gastroenterology

## 2022-01-10 ENCOUNTER — Encounter: Payer: Self-pay | Admitting: Gastroenterology

## 2022-01-10 VITALS — BP 156/92 | HR 78 | Temp 98.3°F | Wt 205.4 lb

## 2022-01-10 DIAGNOSIS — K219 Gastro-esophageal reflux disease without esophagitis: Secondary | ICD-10-CM | POA: Diagnosis not present

## 2022-01-10 DIAGNOSIS — R748 Abnormal levels of other serum enzymes: Secondary | ICD-10-CM

## 2022-01-10 MED ORDER — OMEPRAZOLE 20 MG PO CPDR: 20.0000 mg | DELAYED_RELEASE_CAPSULE | Freq: Every day | ORAL | 3 refills | Status: DC

## 2022-01-10 NOTE — Addendum Note (Signed)
Addended by: Wayna Chalet on: 01/10/2022 01:41 PM   Modules accepted: Orders

## 2022-01-10 NOTE — Progress Notes (Signed)
James Bellows MD, MRCP(U.K) [address]  [place]  [place], [place] [address]  Main: [phone]  Fax: [phone]   Primary Care Physician: James Sons, MD  Primary Gastroenterologist:  Dr. Jonathon Compton   Chief Complaint  Patient presents with   Elevated alkaline phosphatase level    HPI: James Compton is a 71 y.o. male  Summary of history : Initially seen and referred for nausea on 07/17/2021.  05/15/2021 right upper quadrant ultrasound showed no abnormalities  May 2022 CMP demonstrated elevated alkaline phosphatase 161 total bilirubin of 1.4 normal AST and ALT hemoglobin normal TSH normal.Last colonoscopy in May 2016 by Dr. [place] Compton.   He states that he has occasional nausea.  Does not follow clear pattern.  Can start all of a sudden.  And resolve all of a sudden.  Denies any abdominal pain.  No new medications.  Denies any heartburn.  He is on Prilosec but do not take it regularly.  He has had abnormal alkaline phosphatase he recollects for more than 4 to 5 years.  No clear etiology was given.   07/17/2021: PTH, calcium was normal.  GGT 48 normal.  Not immune to hepatitis B and ceruloplasmin level was low requiring 24-hour urinary copper and ophthalmology exam to rule out KF rings.  Immune to hepatitis A.  ANA positive smooth muscle antibody negative ceruloplasmin 12 iron studies normal immunoglobulins normal celiac serology negative.  Antimitochondrial antibody negative He states that he has had a recent ophthalmic exam and no abnormalities were noted.    Interval history  09/11/2021-01/10/2022   09/11/2021: 24-hour copper urine excretion normal  His nausea has significantly improved since last visit he is taking Prilosec 40 mg once a day.  Significant improvement noted.  Current Outpatient Medications  Medication Sig Dispense Refill   acetaminophen (TYLENOL) 500 MG tablet Take 1,000 mg by mouth every 8 (eight) hours as needed (pain).     aspirin 325 MG EC tablet Take  325 mg by mouth daily.     atorvastatin (LIPITOR) 80 MG tablet TAKE 1 TABLET BY MOUTH EVERY EVENING 90 tablet 1   cetirizine (ZYRTEC) 10 MG tablet      Coenzyme Q10 200 MG capsule Take 1 capsule by mouth 1 day or 1 dose.     fluticasone (FLONASE) 50 MCG/ACT nasal spray Place 2 sprays into both nostrils daily as needed for allergies or rhinitis.      gentamicin ointment (GARAMYCIN) 0.1 % Apply 1 application topically 2 (two) times daily. Apply to affected area 3 times a day     ketoconazole (NIZORAL) 2 % cream Apply 1 application topically daily as needed for irritation. 15 g 1   losartan (COZAAR) 50 MG tablet Take 50 mg by mouth daily.     meloxicam (MOBIC) 15 MG tablet Take 1 tablet (15 mg total) by mouth daily as needed for pain. 30 tablet 1   Multiple Vitamin (MULTIVITAMIN) capsule Take 1 capsule by mouth daily. PM     omeprazole (PRILOSEC) 40 MG capsule 1 capsule (40 mg total) once daily as needed     Probiotic Product (PROBIOTIC DAILY PO) Take 1 capsule by mouth daily. PM     No current facility-administered medications for this visit.    Allergies as of 01/10/2022 - Review Complete 01/10/2022  Allergen Reaction Noted   Dipyridamole  11/03/2018   Penicillins Nausea Only 04/06/2015    ROS:  General: Negative for anorexia, weight loss, fever, chills, fatigue, weakness. ENT:  Negative for hoarseness, difficulty swallowing , nasal congestion. CV: Negative for chest pain, angina, palpitations, dyspnea on exertion, peripheral edema.  Respiratory: Negative for dyspnea at rest, dyspnea on exertion, cough, sputum, wheezing.  GI: See history of present illness. GU:  Negative for dysuria, hematuria, urinary incontinence, urinary frequency, nocturnal urination.  Endo: Negative for unusual weight change.    Physical Examination:   BP (!) 156/92    Pulse 78    Temp 98.3 F (36.8 C) (Oral)    Wt 205 lb 6.4 oz (93.2 kg)    BMI 30.33 kg/m   General: Well-nourished, well-developed in no acute  distress.  Eyes: No icterus. Conjunctivae pink. Neuro: Alert and oriented x 3.  Grossly intact. Skin: Warm and dry, no jaundice.   Psych: Alert and cooperative, normal mood and affect.   Imaging Studies: No results found.  Assessment and Plan:   James Compton is a 71 y.o. y/o male here to follow-up for episodic nausea and elevated alkaline phosphatase.  The nausea has significantly improved after commencing him on Prilosec 40 mg once a day for acid reflux.  Urinary copper level checked was normal.  He does not have any neuropsychiatric issues concerning for Wilson's disease .  It is possible that the low ceruloplasmin was a false positive.   Plan  GGT was previously normal.  We will recheck today with CMP along with ceruloplasmin Decrease dose of Prilosec to 20 mg a day at next visit if doing well we will change to famotidine.  In the meantime suggested to adopt lifestyle changes for acid reflux such as using a wedge pillow losing weight and timing of his dinner a few hours before bedtime  Dr James Bellows  MD,MRCP [facility]) Follow up in 6 months

## 2022-01-11 ENCOUNTER — Encounter: Payer: Self-pay | Admitting: Dermatology

## 2022-01-11 ENCOUNTER — Telehealth: Payer: Self-pay

## 2022-01-11 DIAGNOSIS — R748 Abnormal levels of other serum enzymes: Secondary | ICD-10-CM

## 2022-01-11 LAB — GAMMA GT: GGT: 60 IU/L (ref 0–65)

## 2022-01-11 LAB — COMPREHENSIVE METABOLIC PANEL
ALT: 25 IU/L (ref 0–44)
AST: 42 IU/L — ABNORMAL HIGH (ref 0–40)
Albumin/Globulin Ratio: 2.1 (ref 1.2–2.2)
Albumin: 4.7 g/dL (ref 3.7–4.7)
Alkaline Phosphatase: 183 IU/L — ABNORMAL HIGH (ref 44–121)
BUN/Creatinine Ratio: 14 (ref 10–24)
BUN: 12 mg/dL (ref 8–27)
Bilirubin Total: 1.3 mg/dL — ABNORMAL HIGH (ref 0.0–1.2)
CO2: 26 mmol/L (ref 20–29)
Calcium: 9.4 mg/dL (ref 8.6–10.2)
Chloride: 102 mmol/L (ref 96–106)
Creatinine, Ser: 0.85 mg/dL (ref 0.76–1.27)
Globulin, Total: 2.2 g/dL (ref 1.5–4.5)
Glucose: 77 mg/dL (ref 70–99)
Potassium: 3.8 mmol/L (ref 3.5–5.2)
Sodium: 141 mmol/L (ref 134–144)
Total Protein: 6.9 g/dL (ref 6.0–8.5)
eGFR: 92 mL/min/{1.73_m2} (ref >59)

## 2022-01-11 LAB — CERULOPLASMIN: Ceruloplasmin: 13.7 mg/dL — ABNORMAL LOW (ref 16.0–31.0)

## 2022-01-11 NOTE — Telephone Encounter (Signed)
Called [facility] to see if I could add serum copper to his existing labs. However, I was told that it had to be drawn with another type of tube. Therefore, I called the patient to ask him if he could have additional lab drawn per Dr. Georgeann Oppenheim request and he stated that it was fine. He stated that he would come to our lab on Monday afternoon to have it drawn. I told him that I would place the order and that all he needed was to show up. Patient agreed.

## 2022-01-11 NOTE — Telephone Encounter (Signed)
-----   Message from Jonathon Bellows, MD sent at 01/11/2022 10:34 AM EST ----- Can we add serum copper to his labs please

## 2022-01-11 NOTE — Progress Notes (Signed)
Can we add serum copper to his labs please

## 2022-01-13 NOTE — Progress Notes (Signed)
Can you set up video visit or office visit over the next few weeks to discuss the results since the ceruloplasmin is still very low.  Would like to to see him after his serum copper levels.

## 2022-01-16 LAB — COPPER, SERUM: Copper: 68 ug/dL — ABNORMAL LOW (ref 69–132)

## 2022-01-16 NOTE — Progress Notes (Signed)
Copper borderline normal. Need office visit or video visit in 2-3 weeks to discuss next steps and options.

## 2022-01-17 ENCOUNTER — Ambulatory Visit: Payer: Medicare Other | Admitting: Dermatology

## 2022-01-30 ENCOUNTER — Other Ambulatory Visit: Payer: Self-pay | Admitting: Family Medicine

## 2022-01-30 DIAGNOSIS — I10 Essential (primary) hypertension: Secondary | ICD-10-CM

## 2022-01-31 ENCOUNTER — Telehealth (INDEPENDENT_AMBULATORY_CARE_PROVIDER_SITE_OTHER): Payer: Medicare Other | Admitting: Gastroenterology

## 2022-01-31 ENCOUNTER — Other Ambulatory Visit: Payer: Self-pay | Admitting: Family Medicine

## 2022-01-31 DIAGNOSIS — K219 Gastro-esophageal reflux disease without esophagitis: Secondary | ICD-10-CM

## 2022-01-31 DIAGNOSIS — R748 Abnormal levels of other serum enzymes: Secondary | ICD-10-CM

## 2022-01-31 NOTE — Addendum Note (Signed)
Addended by: Wayna Chalet on: 01/31/2022 03:58 PM ? ? Modules accepted: Orders ? ?

## 2022-01-31 NOTE — Progress Notes (Signed)
?  ?Jonathon Bellows , MD ?[name], [name] ? ?Primary Care Physician: [name], MD ? ?Virtual Visit via Video Note ? ?I connected with patient on 01/31/22 at  3:15 PM EST by video and verified that I am speaking with the correct person using two identifiers. ?  ?I discussed the limitations, risks, security and privacy concerns of performing an evaluation and management service by video  and the availability of in person appointments. I also discussed with the patient that there may be a patient responsible charge related to this service. The patient expressed understanding and agreed to proceed. ? ?Location of Patient: Home ?Location of Provider: Home ?Persons involved: Patient and provider only ? ? ?History of Present Illness: ?Chief Complaint  ?Patient presents with  ? low ceruloplasmin  ? ? ?HPI: GERHARD Compton is a 71 y.o. male ? ? ?Summary of history : ?Initially seen and referred for nausea on 07/17/2021.  05/15/2021 right upper quadrant ultrasound showed no abnormalities ? ?May 2022 CMP demonstrated elevated alkaline phosphatase 161 total bilirubin of 1.4 normal AST and ALT hemoglobin normal TSH normal.Last colonoscopy in May 2016 by Dr. [place] Norris. ?  ?He states that he has occasional nausea.  Does not follow clear pattern.  Can start all of a sudden.  And resolve all of a sudden.  Denies any abdominal pain.  No new medications.  Denies any heartburn.  He is on Prilosec but do not take it regularly.  He has had abnormal alkaline phosphatase he recollects for more than 4 to 5 years.  No clear etiology was given. ?  ?07/17/2021: PTH, calcium was normal.  GGT 48 normal.  Not immune to hepatitis B and ceruloplasmin level was low requiring 24-hour urinary copper and ophthalmology exam to rule out KF rings.  Immune to hepatitis A.  ANA positive smooth muscle antibody negative ceruloplasmin 12 iron studies normal immunoglobulins normal  celiac serology negative.  Antimitochondrial antibody negative ?He states that he has had a recent ophthalmic exam and no abnormalities were noted.  ?09/11/2021: 24-hour copper urine excretion normal ? ?  ?Interval history 01/10/2022-01/31/2022 ? ? ? 01/14/2022: Serum copper 68 (low normal), ceruloplasmin 13.7, GGT normal.  ? ?Since last visit no issues and nausea.  Explained to him that the reason to speak to him today is about his low ceruloplasmin. ?  ? ? ? ? ?Current Outpatient Medications  ?Medication Sig Dispense Refill  ? acetaminophen (TYLENOL) 500 MG tablet Take 1,000 mg by mouth every 8 (eight) hours as needed (pain).    ? aspirin 325 MG EC tablet Take 325 mg by mouth daily.    ? atorvastatin (LIPITOR) 80 MG tablet TAKE 1 TABLET BY MOUTH EVERY EVENING 90 tablet 1  ? cetirizine (ZYRTEC) 10 MG tablet     ? Coenzyme Q10 200 MG capsule Take 1 capsule by mouth 1 day or 1 dose.    ? fluticasone (FLONASE) 50 MCG/ACT nasal spray Place 2 sprays into both nostrils daily as needed for allergies or rhinitis.     ? gentamicin ointment (GARAMYCIN) 0.1 % Apply 1 application topically 2 (two) times daily. Apply to affected area 3 times a day    ? ketoconazole (NIZORAL) 2 % cream Apply 1 application topically daily as needed for irritation. 15 g 1  ? losartan (COZAAR) 50 MG tablet Take 50 mg by mouth daily.    ? meloxicam (MOBIC) 15  MG tablet Take 1 tablet (15 mg total) by mouth daily as needed for pain. 30 tablet 1  ? Multiple Vitamin (MULTIVITAMIN) capsule Take 1 capsule by mouth daily. PM    ? omeprazole (PRILOSEC) 20 MG capsule Take 1 capsule (20 mg total) by mouth daily. 90 capsule 3  ? Probiotic Product (PROBIOTIC DAILY PO) Take 1 capsule by mouth daily. PM    ? ?No current facility-administered medications for this visit.  ? ? ?Allergies as of 01/31/2022 - Review Complete 01/11/2022  ?Allergen Reaction Noted  ? Dipyridamole  11/03/2018  ? Penicillins Nausea Only 04/06/2015  ? ? ?Review of Systems:    ?All systems  reviewed and negative except where noted in HPI.  ?General Appearance:    Alert, cooperative, no distress, appears stated age  ?Head:    Normocephalic, without obvious abnormality, atraumatic  ?Eyes:    PERRL, conjunctiva/corneas clear,  ?Ears:    Grossly normal hearing   ? ?Neurologic:  Grossly normal   ? ?Observations/Objective: ? ?Labs: ?CMP  ?   ?Component Value Date/Time  ? NA 141 01/10/2022 1335  ? NA 145 01/30/2012 1746  ? K 3.8 01/10/2022 1335  ? K 3.8 01/30/2012 1746  ? CL 102 01/10/2022 1335  ? CL 105 01/30/2012 1746  ? CO2 26 01/10/2022 1335  ? CO2 24 01/30/2012 1746  ? GLUCOSE 77 01/10/2022 1335  ? GLUCOSE 105 (H) 11/11/2018 1342  ? GLUCOSE 96 01/30/2012 1746  ? BUN 12 01/10/2022 1335  ? BUN 10 01/30/2012 1746  ? CREATININE 0.85 01/10/2022 1335  ? CREATININE 0.87 01/30/2012 1746  ? CALCIUM 9.4 01/10/2022 1335  ? CALCIUM 8.6 01/30/2012 1746  ? PROT 6.9 01/10/2022 1335  ? PROT 7.6 01/30/2012 1746  ? ALBUMIN 4.7 01/10/2022 1335  ? ALBUMIN 4.1 01/30/2012 1746  ? AST 42 (H) 01/10/2022 1335  ? AST 41 (H) 01/30/2012 1746  ? ALT 25 01/10/2022 1335  ? ALT 38 01/30/2012 1746  ? ALKPHOS 183 (H) 01/10/2022 1335  ? ALKPHOS 120 01/30/2012 1746  ? BILITOT 1.3 (H) 01/10/2022 1335  ? BILITOT 1.0 01/30/2012 1746  ? GFRNONAA 88 01/14/2020 0832  ? GFRNONAA >60 01/30/2012 1746  ? GFRAA 101 01/14/2020 0832  ? GFRAA >60 01/30/2012 1746  ? ?Lab Results  ?Component Value Date  ? WBC 6.9 04/02/2021  ? HGB 15.4 04/02/2021  ? HCT 45.5 04/02/2021  ? MCV 91 04/02/2021  ? PLT 184 04/02/2021  ? ? ?Imaging Studies: ?No results found. ? ?Assessment and Plan:  ? ?James Compton is a 71 y.o. y/o male  here to follow-up for episodic nausea and elevated alkaline phosphatase.  The nausea has significantly improved after commencing him on Prilosec 40 mg once a day for acid reflux.  Urinary copper level checked was normal.  He does not have any neuropsychiatric issues concerning for Wilson's disease .  I rechecked his ceruloplasmin levels and it  is still very low.  His GGT is normal.  I explained to him that his clinical picture is not fitting that of Wilson's disease but I want to make sure that I am doing everything that needs to be done.  I explained to him that we could get a liver biopsy to confirm but I feel that before subjecting him to an invasive procedure I would like to get a second opinion at [facility].  If they do feel that it does not fit a picture of Wilson's disease then we can avoid a liver biopsy.  He agreed with the plan and we will refer him to Dr. Manuella Ghazi at [facility] ? ? ?  ?I discussed the assessment and treatment plan with the patient. The patient was provided an opportunity to ask questions and all were answered. The patient agreed with the plan and demonstrated an understanding of the instructions. ?  ?The patient was advised to call back or seek an in-person evaluation if the symptoms worsen or if the condition fails to improve as anticipated. ? ?I provided 15 minutes of face-to-face time during this encounter. ? ?Dr Jonathon Bellows MD,MRCP [facility]) ?Gastroenterology/Hepatology ?Pager: [phone] ? ? ?Speech recognition software was used to dictate this note.   ?

## 2022-02-11 ENCOUNTER — Ambulatory Visit: Payer: Medicare Other | Admitting: Dermatology

## 2022-02-21 ENCOUNTER — Other Ambulatory Visit: Payer: Self-pay | Admitting: Adult Health Nurse Practitioner

## 2022-02-21 DIAGNOSIS — R748 Abnormal levels of other serum enzymes: Secondary | ICD-10-CM

## 2022-03-02 ENCOUNTER — Ambulatory Visit
Admission: RE | Admit: 2022-03-02 | Discharge: 2022-03-02 | Disposition: A | Discharge status: Home / Self Care | Payer: Medicare Other | Source: Ambulatory Visit | Attending: Adult Health Nurse Practitioner | Admitting: Adult Health Nurse Practitioner

## 2022-03-02 ENCOUNTER — Other Ambulatory Visit: Payer: Self-pay | Admitting: Adult Health Nurse Practitioner

## 2022-03-02 DIAGNOSIS — R748 Abnormal levels of other serum enzymes: Secondary | ICD-10-CM

## 2022-03-02 MED ORDER — GADOBUTROL 1 MMOL/ML IV SOLN
9.0000 mL | Freq: Once | INTRAVENOUS | Status: AC | PRN
  Administered 2022-03-02: 10 mL via INTRAVENOUS

## 2022-04-19 ENCOUNTER — Encounter: Payer: Self-pay | Admitting: Family Medicine

## 2022-04-19 ENCOUNTER — Ambulatory Visit (INDEPENDENT_AMBULATORY_CARE_PROVIDER_SITE_OTHER): Payer: Medicare Other | Admitting: Family Medicine

## 2022-04-19 VITALS — BP 134/71 | HR 77 | Temp 98.4°F | Resp 18 | Ht 69.02 in | Wt 206.0 lb

## 2022-04-19 DIAGNOSIS — I10 Essential (primary) hypertension: Secondary | ICD-10-CM | POA: Diagnosis not present

## 2022-04-19 DIAGNOSIS — M25562 Pain in left knee: Secondary | ICD-10-CM

## 2022-04-19 DIAGNOSIS — Z125 Encounter for screening for malignant neoplasm of prostate: Secondary | ICD-10-CM

## 2022-04-19 DIAGNOSIS — E785 Hyperlipidemia, unspecified: Secondary | ICD-10-CM | POA: Diagnosis not present

## 2022-04-19 DIAGNOSIS — Z0000 Encounter for general adult medical examination without abnormal findings: Secondary | ICD-10-CM | POA: Diagnosis not present

## 2022-04-19 DIAGNOSIS — G8929 Other chronic pain: Secondary | ICD-10-CM

## 2022-04-19 DIAGNOSIS — I77811 Abdominal aortic ectasia: Secondary | ICD-10-CM

## 2022-04-19 NOTE — Patient Instructions (Addendum)
Please review the attached list of medications and notify my office if there are any errors.   The CDC recommends two doses of Shingrix (the shingles vaccine) separated by 2 to 6 months for adults age 73 years and older. I recommend checking with your pharmacy plan regarding coverage for this vaccine.   It is recommended to engage in 150 minutes of moderate exercise every week.     

## 2022-04-19 NOTE — Progress Notes (Signed)
I,Roshena L Chambers,acting as a scribe for Lelon Huh, MD.,have documented all relevant documentation on the behalf of Lelon Huh, MD,as directed by  Lelon Huh, MD while in the presence of Lelon Huh, MD.   Annual Wellness Visit     Patient: James Compton, Male    DOB: [birth year], 71 y.o.   MRN: [id number] Visit Date: 04/19/2022  Today's Provider: Lelon Huh, MD   Chief Complaint  Patient presents with   Medicare Wellness   Hypertension   Hyperlipidemia   Subjective    James Compton is a 71 y.o. male who presents today for his Annual Wellness Visit. He reports consuming a general diet. Gym/ health club routine includes light weights. He generally feels fairly well. He reports sleeping fairly well. He does have additional problems to discuss today.   HPI Hypertension, follow-up  BP Readings from Last 3 Encounters:  04/19/22 134/71  01/10/22 (!) 156/92  12/28/21 120/64   Wt Readings from Last 3 Encounters:  04/19/22 206 lb (93.4 kg)  01/10/22 205 lb 6.4 oz (93.2 kg)  12/28/21 207 lb (93.9 kg)     He was last seen for hypertension 3 months ago.  BP at that visit was 120/64. Management since that visit includes changed to combo Losartan/ HCTZ.  He reports good compliance with treatment. He is not having side effects.  He is following a Regular diet. He is exercising. He does not smoke.  Use of agents associated with hypertension: NSAIDS.   Outside blood pressures are checked and average 115-125/ 60-70. Symptoms: No chest pain No chest pressure  No palpitations No syncope  No dyspnea No orthopnea  No paroxysmal nocturnal dyspnea No lower extremity edema   Pertinent labs Lab Results  Component Value Date   NA 141 01/10/2022   K 3.8 01/10/2022   CREATININE 0.85 01/10/2022   EGFR 92 01/10/2022   GLUCOSE 77 01/10/2022   TSH 1.840 04/02/2021        ---------------------------------------------------------------------------------------------------   Lipid/Cholesterol, Follow-up  Last lipid panel Other pertinent labs  Lab Results  Component Value Date   CHOL 102 04/02/2021   HDL 41 04/02/2021   LDLCALC 40 04/02/2021   TRIG 118 04/02/2021   CHOLHDL 2.5 04/02/2021   Lab Results  Component Value Date   ALT 25 01/10/2022   AST 42 (H) 01/10/2022   PLT 184 04/02/2021   TSH 1.840 04/02/2021     He was last seen for this 5 months ago.  Management since that visit includes continue same treatment.  He reports good compliance with treatment. He is not having side effects.   Symptoms: No chest pain No chest pressure/discomfort  No dyspnea No lower extremity edema  No numbness or tingling of extremity No orthopnea  No palpitations No paroxysmal nocturnal dyspnea  No speech difficulty No syncope   Current diet: well balanced Current exercise:  weight lifting   The ASCVD Risk score (Arnett DK, et al., 2019) failed to calculate for the following reasons:   The valid total cholesterol range is 130 to 320 mg/dL  ---------------------------------------------------------------------------------------------------    Medications: Outpatient Medications Prior to Visit  Medication Sig   acetaminophen (TYLENOL) 500 MG tablet Take 1,000 mg by mouth every 8 (eight) hours as needed (pain).   aspirin 325 MG EC tablet Take 325 mg by mouth daily.   atorvastatin (LIPITOR) 80 MG tablet TAKE 1 TABLET BY MOUTH EVERY EVENING   cetirizine (ZYRTEC) 10 MG tablet    Coenzyme Q10  200 MG capsule Take 1 capsule by mouth 1 day or 1 dose.   fluticasone (FLONASE) 50 MCG/ACT nasal spray Place 2 sprays into both nostrils daily as needed for allergies or rhinitis.    gentamicin ointment (GARAMYCIN) 0.1 % Apply 1 application topically 2 (two) times daily. Apply to affected area 3 times a day   ketoconazole (NIZORAL) 2 % cream Apply 1 application  topically daily as needed for irritation.   losartan-hydrochlorothiazide (HYZAAR) 50-12.5 MG tablet Take 1 tablet by mouth daily.   meloxicam (MOBIC) 15 MG tablet TAKE 1 TABLET BY MOUTH EVERY DAY AS NEEDED FOR PAIN   Multiple Vitamin (MULTIVITAMIN) capsule Take 1 capsule by mouth daily. PM   omeprazole (PRILOSEC) 20 MG capsule Take 1 capsule (20 mg total) by mouth daily.   Probiotic Product (PROBIOTIC DAILY PO) Take 1 capsule by mouth daily. PM   [DISCONTINUED] losartan (COZAAR) 50 MG tablet Take 50 mg by mouth daily.   No facility-administered medications prior to visit.    Allergies  Allergen Reactions   Dipyridamole     Headaches, nausea, upset stomach   Penicillins Nausea Only    Has patient had a PCN reaction causing immediate rash, facial/tongue/throat swelling, SOB or lightheadedness with hypotension: no Has patient had a PCN reaction causing severe rash involving mucus membranes or skin necrosis: no Has patient had a PCN reaction that required hospitalization: no Has patient had a PCN reaction occurring within the last 10 years: no If all of the above answers are "NO", then may proceed with Cephalosporin use.     Patient Care Team: [name], MD as PCP - General (Family Medicine) Ubaldo Glassing Javier Docker, MD as Consulting Physician (Cardiology) Marry Guan, Laurice Record, MD as Consulting Physician (Orthopedic Surgery) Beverly Gust, MD as Consulting Physician (Otolaryngology) Jannifer Franklin, NP as Nurse Practitioner (Neurology)  Review of Systems  Constitutional:  Negative for appetite change, chills, fatigue and fever.  HENT:  Negative for congestion, ear pain, hearing loss, nosebleeds and trouble swallowing.   Eyes:  Negative for pain and visual disturbance.  Respiratory:  Negative for cough, chest tightness and shortness of breath.   Cardiovascular:  Negative for chest pain, palpitations and leg swelling.  Gastrointestinal:  Negative for abdominal pain, blood in stool,  constipation, diarrhea, nausea and vomiting.  Endocrine: Negative for polydipsia, polyphagia and polyuria.  Genitourinary:  Negative for dysuria and flank pain.  Musculoskeletal:  Positive for arthralgias (left knee pain). Negative for back pain, joint swelling, myalgias and neck stiffness.  Skin:  Negative for color change, rash and wound.  Neurological:  Negative for dizziness, tremors, seizures, speech difficulty, weakness, light-headedness and headaches.  Psychiatric/Behavioral:  Negative for behavioral problems, confusion, decreased concentration, dysphoric mood and sleep disturbance. The patient is not nervous/anxious.   All other systems reviewed and are negative.      Objective    Vitals: BP 134/71 (BP Location: Left Arm, Patient Position: Sitting, Cuff Size: Large)   Pulse 77   Temp 98.4 F (36.9 C) (Oral)   Resp 18   Ht 5' 9.02" (1.753 m)   Wt 206 lb (93.4 kg)   SpO2 98% Comment: room air  BMI 30.41 kg/m     Physical Exam  General: Appearance:    Mildly obese male in no acute distress  Eyes:    PERRL, conjunctiva/corneas clear, EOM's intact       Lungs:     Clear to auscultation bilaterally, respirations unlabored  Heart:    Normal heart  rate. Normal rhythm. No murmurs, rubs, or gallops.    MS:   All extremities are intact.    Neurologic:   Awake, alert, oriented x 3. No apparent focal neurological defect.         Most recent functional status assessment:    04/18/2022    2:36 PM  In your present state of health, do you have any difficulty performing the following activities:  Hearing? 1  Vision? 0  Difficulty concentrating or making decisions? 0  Walking or climbing stairs? 1  Dressing or bathing? 0  Doing errands, shopping? 0  Preparing Food and eating ? N  Using the Toilet? N  In the past six months, have you accidently leaked urine? N  Do you have problems with loss of bowel control? N  Managing your Medications? N  Managing your Finances? N   Housekeeping or managing your Housekeeping? N   Most recent fall risk assessment:    04/18/2022    2:36 PM  [place] in the past year? 0  Number falls in past yr: 0  Injury with Fall? 0    Most recent depression screenings:    04/19/2022    2:10 PM 04/02/2021    8:42 AM  PHQ 2/9 Scores  PHQ - 2 Score 0 0  PHQ- 9 Score 1 0   Most recent cognitive screening:    10/31/2017    9:16 AM  6CIT Screen  What Year? 0 points  What month? 0 points  What time? 0 points  Count back from 20 0 points  Months in reverse 2 points  Repeat phrase 0 points  Total Score 2 points   Most recent Audit-C alcohol use screening    04/19/2022    2:10 PM  Alcohol Use Disorder Test (AUDIT)  1. How often do you have a drink containing alcohol? 0  2. How many drinks containing alcohol do you have on a typical day when you are drinking? 0  3. How often do you have six or more drinks on one occasion? 0  AUDIT-C Score 0   A score of 3 or more in women, and 4 or more in men indicates increased risk for alcohol abuse, EXCEPT if all of the points are from question 1    Assessment & Plan     Annual wellness visit done today including the all of the following: Reviewed patient's Family Medical History Reviewed and updated list of patient's medical providers Assessment of cognitive impairment was done Assessed patient's functional ability Established a written schedule for health screening [place] Completed and Reviewed  Exercise Activities and Dietary recommendations  Goals      DIET - REDUCE CALORIE INTAKE     Recommend to cut back on heavy carbohydrates to help aid in weight loss.          Immunization History  Administered Date(s) Administered   Fluad Quad(high Dose 65+) 08/04/2019, 11/05/2021   Hepb-cpg 09/10/2021   Influenza, High Dose Seasonal PF 08/26/2017, 09/24/2018   Influenza-Unspecified 09/25/2016   PFIZER Comirnaty(Gray Top)Covid-19 Tri-Sucrose  Vaccine 04/10/2021   PFIZER(Purple Top)SARS-COV-2 Vaccination 01/21/2020, 02/15/2020   Pneumococcal Conjugate-13 01/19/2014   Pneumococcal Polysaccharide-23 03/24/2015   Td 07/04/2004, 09/25/2010   Tdap 09/25/2010, 04/02/2021   Zoster Recombinat (Shingrix) 04/02/2021   Zoster, Live 09/17/2011    Health Maintenance  Topic Date Due   FOOT EXAM  Never done   OPHTHALMOLOGY EXAM  Never done   HEMOGLOBIN A1C  05/13/2019   Zoster Vaccines- Shingrix (2 of 2) 05/28/2021   COVID-19 Vaccine (4 - Booster for Pfizer series) 06/05/2021   INFLUENZA VACCINE  06/25/2022   COLONOSCOPY (Pts 45-1yr Insurance coverage will need to be confirmed)  04/13/2025   TETANUS/TDAP  04/03/2031   Pneumonia Vaccine 71 Years old  Completed   Hepatitis C Screening  Completed   HPV VACCINES  Aged Out     Discussed health benefits of physical activity, and encouraged him to engage in regular exercise appropriate for his age and condition.    1. Aortic ectasia, abdominal (HCC) Due for AA ultrasound next year.   2. Essential hypertension Well controlled.  Continue current medications.   - Basic Metabolic Panel (BMET) - EKG 12-Lead  3. Chronic pain of left knee Wearing knee brace consistently, periodic knee brace remains effective.   4. Hyperlipidemia, unspecified hyperlipidemia type He is tolerating atorvastatin well with no adverse effects.   - Lipid panel  5. Prostate cancer screening  - PSA Total (Reflex To Free) ([facility] only)       The entirety of the information documented in the History of Present Illness, Review of Systems and Physical Exam were personally obtained by me. Portions of this information were initially documented by the CMA and reviewed by me for thoroughness and accuracy.     DLelon Huh MD  [phone](phone) [phone](fax)  [place]

## 2022-04-23 ENCOUNTER — Other Ambulatory Visit: Payer: Self-pay | Admitting: Family Medicine

## 2022-04-24 LAB — BASIC METABOLIC PANEL
BUN/Creatinine Ratio: 24 (ref 10–24)
BUN: 20 mg/dL (ref 8–27)
CO2: 28 mmol/L (ref 20–29)
Calcium: 9 mg/dL (ref 8.6–10.2)
Chloride: 103 mmol/L (ref 96–106)
Creatinine, Ser: 0.84 mg/dL (ref 0.76–1.27)
Glucose: 97 mg/dL (ref 70–99)
Potassium: 4.2 mmol/L (ref 3.5–5.2)
Sodium: 140 mmol/L (ref 134–144)
eGFR: 92 mL/min/{1.73_m2} (ref >59)

## 2022-04-24 LAB — LIPID PANEL
Chol/HDL Ratio: 2.7 ratio (ref 0.0–5.0)
Cholesterol, Total: 95 mg/dL — ABNORMAL LOW (ref 100–199)
HDL: 35 mg/dL — ABNORMAL LOW (ref >39)
LDL Chol Calc (NIH): 26 mg/dL (ref 0–99)
Triglycerides: 216 mg/dL — ABNORMAL HIGH (ref 0–149)
VLDL Cholesterol Cal: 34 mg/dL (ref 5–40)

## 2022-04-24 LAB — PSA TOTAL (REFLEX TO FREE): Prostate Specific Ag, Serum: 1.1 ng/mL (ref 0.0–4.0)

## 2022-05-19 ENCOUNTER — Other Ambulatory Visit: Payer: Self-pay | Admitting: Family Medicine

## 2022-05-23 ENCOUNTER — Other Ambulatory Visit: Payer: Self-pay | Admitting: Family Medicine

## 2022-05-23 NOTE — Telephone Encounter (Signed)
Requested medication (s) are due for refill today:   Yes  Requested medication (s) are on the active medication list:   Yes  Future visit scheduled:   No   Last ordered: 02/01/2022 #30, 3 refills  Returned because H&H due per protocol.     Requested Prescriptions  Pending Prescriptions Disp Refills   meloxicam (MOBIC) 15 MG tablet [Pharmacy Med Name: MELOXICAM 15 MG TABLET] 30 tablet 3    Sig: TAKE 1 TABLET BY MOUTH EVERY DAY AS NEEDED FOR PAIN     Analgesics:  COX2 Inhibitors Failed - 05/23/2022  2:04 AM      Failed - Manual Review: Labs are only required if the patient has taken medication for more than 8 weeks.      Failed - HGB in normal range and within 360 days    Hemoglobin  Date Value Ref Range Status  04/02/2021 15.4 13.0 - 17.7 g/dL Final         Failed - HCT in normal range and within 360 days    Hematocrit  Date Value Ref Range Status  04/02/2021 45.5 37.5 - 51.0 % Final         Failed - AST in normal range and within 360 days    AST  Date Value Ref Range Status  01/10/2022 42 (H) 0 - 40 IU/L Final   SGOT(AST)  Date Value Ref Range Status  01/30/2012 41 (H) 15 - 37 Unit/L Final         Passed - Cr in normal range and within 360 days    Creatinine  Date Value Ref Range Status  01/30/2012 0.87 0.60 - 1.30 mg/dL Final   Creatinine, Ser  Date Value Ref Range Status  04/23/2022 0.84 0.76 - 1.27 mg/dL Final         Passed - ALT in normal range and within 360 days    ALT  Date Value Ref Range Status  01/10/2022 25 0 - 44 IU/L Final   SGPT (ALT)  Date Value Ref Range Status  01/30/2012 38 U/L Final    Comment:    12-78 NOTE: NEW REFERENCE RANGE 10/18/2011          Passed - eGFR is 30 or above and within 360 days    EGFR (African American)  Date Value Ref Range Status  01/30/2012 >60 >58mL/min Final   GFR calc Af Amer  Date Value Ref Range Status  01/14/2020 101 >59 mL/min/1.73 Final   EGFR (Non-African Amer.)  Date Value Ref Range Status   01/30/2012 >60 >42mL/min Final    Comment:    eGFR values <42mL/min/1.73 m2 may be an indication of chronic kidney disease (CKD). Calculated eGFR, using the MRDR Study equation, is useful in  patients with stable renal function. The eGFR calculation will not be reliable in acutely ill patients when serum creatinine is changing rapidly. It is not useful in patients on dialysis. The eGFR calculation may not be applicable to patients at the low and high extremes of body sizes, pregnant women, and vegetarians.    GFR calc non Af Amer  Date Value Ref Range Status  01/14/2020 88 >59 mL/min/1.73 Final   eGFR  Date Value Ref Range Status  04/23/2022 92 >59 mL/min/1.73 Final         Passed - Patient is not pregnant      Passed - Valid encounter within last 12 months    Recent Outpatient Visits  1 month ago Aortic ectasia, abdominal [facility])   [facility] [name], MD   4 months ago Essential hypertension   [facility] [name], MD   6 months ago Essential hypertension   [facility] [name], MD   1 year ago Essential hypertension   [facility] [name], MD   1 year ago Medicare annual wellness visit, subsequent   [facility] [name], MD       Future Appointments             In 2 months Jonathon Bellows, MD [place]   In 3 months Ralene Bathe, MD [place]

## 2022-06-24 ENCOUNTER — Other Ambulatory Visit: Payer: Self-pay | Admitting: Physician Assistant

## 2022-07-07 DIAGNOSIS — M1712 Unilateral primary osteoarthritis, left knee: Secondary | ICD-10-CM | POA: Insufficient documentation

## 2022-07-15 ENCOUNTER — Ambulatory Visit: Payer: Medicare Other | Admitting: Gastroenterology

## 2022-07-22 ENCOUNTER — Ambulatory Visit: Payer: Medicare Other | Admitting: Gastroenterology

## 2022-07-23 ENCOUNTER — Other Ambulatory Visit: Payer: Self-pay | Admitting: Physician Assistant

## 2022-07-23 ENCOUNTER — Telehealth: Payer: Self-pay

## 2022-07-23 NOTE — Telephone Encounter (Signed)
Pt. Calling to verify quarantine recommendations for COVID 19. Verbalizes understanding of initial 5 days and then additional 5 days with a mask around other people.

## 2022-08-05 NOTE — Discharge Instructions (Signed)
Instructions after Total Knee Replacement   Bindu Docter P. Carson Meche, Jr., M.D.     Dept. of Orthopaedics & Sports Medicine  [facility]  [address]  [place], [place]  [address]  Phone: [phone]   Fax: [phone]    DIET: Drink plenty of non-alcoholic fluids. Resume your normal diet. Include foods high in fiber.  ACTIVITY:  You may use crutches or a walker with weight-bearing as tolerated, unless instructed otherwise. You may be weaned off of the walker or crutches by your Physical Therapist.  Do NOT place pillows under the knee. Anything placed under the knee could limit your ability to straighten the knee.   Continue doing gentle exercises. Exercising will reduce the pain and swelling, increase motion, and prevent muscle weakness.   Please continue to use the TED compression stockings for 6 weeks. You may remove the stockings at night, but should reapply them in the morning. Do not drive or operate any equipment until instructed.  WOUND CARE:  Continue to use the PolarCare or ice packs periodically to reduce pain and swelling. You may bathe or shower after the staples are removed at the first office visit following surgery.  MEDICATIONS: You may resume your regular medications. Please take the pain medication as prescribed on the medication. Do not take pain medication on an empty stomach. You have been given a prescription for a blood thinner (Lovenox or Coumadin). Please take the medication as instructed. (NOTE: After completing a 2 week course of Lovenox, take one Enteric-coated aspirin once a day. This along with elevation will help reduce the possibility of phlebitis in your operated leg.) Do not drive or drink alcoholic beverages when taking pain medications.  CALL THE OFFICE FOR: Temperature above 101 degrees Excessive bleeding or drainage on the dressing. Excessive swelling, coldness, or paleness of the toes. Persistent nausea and vomiting.  FOLLOW-UP:  You  should have an appointment to return to the office in 10-14 days after surgery. Arrangements have been made for continuation of Physical Therapy (either home therapy or outpatient therapy).   [facility] Department Directory         [email or web address]       [email or web address]          Cardiology  Appointments: [place] - [phone] [place] - [phone]  Endocrinology  Appointments: [place] - [phone] [place] - [phone]  Gastroenterology  Appointments: [place] - [phone] [place] - [phone]        General Surgery   Appointments: [place] - [phone]  Internal Medicine/Family Medicine  Appointments: [place] - [phone] [place] - [phone] [place] - [phone]  Metabolic and Weigh Loss Surgery  Appointments: [place] - [phone]        Neurology  Appointments: [place] - [phone] [place] - [phone]  Neurosurgery  Appointments: [place] - [phone]  Obstetrics & Gynecology  Appointments: [place] - [phone] [place] - [phone]        Pediatrics  Appointments: [place] - [phone] [place] - [phone]  Physiatry  Appointments: [place] -[phone]  Physical Therapy  Appointments: [place] - [phone] [place] - [phone]        Podiatry  Appointments: [place] - [phone] [place] - [phone]  Pulmonology  Appointments: [place] - [phone]  Rheumatology  Appointments: [place] - [phone]        [place] Location: [facility]  [address] , [place]  [address]  [place] Location: [facility] [address] [place], [place]  [address]  [place] Location: [facility] [address] [place], [place]  [address]    

## 2022-08-14 ENCOUNTER — Encounter
Admission: RE | Admit: 2022-08-14 | Discharge: 2022-08-14 | Disposition: A | Discharge status: Home / Self Care | Payer: Medicare Other | Source: Ambulatory Visit | Attending: Orthopedic Surgery | Admitting: Orthopedic Surgery

## 2022-08-14 ENCOUNTER — Other Ambulatory Visit: Payer: Self-pay

## 2022-08-14 VITALS — BP 141/86 | HR 66 | Resp 16 | Ht 69.5 in | Wt 203.0 lb

## 2022-08-14 DIAGNOSIS — Z79899 Other long term (current) drug therapy: Secondary | ICD-10-CM | POA: Diagnosis not present

## 2022-08-14 DIAGNOSIS — T502X5A Adverse effect of carbonic-anhydrase inhibitors, benzothiadiazides and other diuretics, initial encounter: Secondary | ICD-10-CM | POA: Diagnosis not present

## 2022-08-14 DIAGNOSIS — E119 Type 2 diabetes mellitus without complications: Secondary | ICD-10-CM | POA: Insufficient documentation

## 2022-08-14 DIAGNOSIS — Z880 Allergy status to penicillin: Secondary | ICD-10-CM | POA: Diagnosis not present

## 2022-08-14 DIAGNOSIS — Z01818 Encounter for other preprocedural examination: Secondary | ICD-10-CM | POA: Insufficient documentation

## 2022-08-14 DIAGNOSIS — M1712 Unilateral primary osteoarthritis, left knee: Secondary | ICD-10-CM

## 2022-08-14 DIAGNOSIS — E876 Hypokalemia: Secondary | ICD-10-CM | POA: Diagnosis not present

## 2022-08-14 DIAGNOSIS — I10 Essential (primary) hypertension: Secondary | ICD-10-CM

## 2022-08-14 DIAGNOSIS — Z01812 Encounter for preprocedural laboratory examination: Secondary | ICD-10-CM

## 2022-08-14 HISTORY — DX: Cerebral infarction, unspecified: I63.9

## 2022-08-14 LAB — COMPREHENSIVE METABOLIC PANEL
ALT: 17 U/L (ref 0–44)
AST: 28 U/L (ref 15–41)
Albumin: 4.2 g/dL (ref 3.5–5.0)
Alkaline Phosphatase: 130 U/L — ABNORMAL HIGH (ref 38–126)
Anion gap: 8 (ref 5–15)
BUN: 16 mg/dL (ref 8–23)
CO2: 27 mmol/L (ref 22–32)
Calcium: 8.6 mg/dL — ABNORMAL LOW (ref 8.9–10.3)
Chloride: 104 mmol/L (ref 98–111)
Creatinine, Ser: 0.74 mg/dL (ref 0.61–1.24)
GFR, Estimated: >60 mL/min (ref >60)
Glucose, Bld: 84 mg/dL (ref 70–99)
Potassium: 3.1 mmol/L — ABNORMAL LOW (ref 3.5–5.1)
Sodium: 139 mmol/L (ref 135–145)
Total Bilirubin: 1.6 mg/dL — ABNORMAL HIGH (ref 0.3–1.2)
Total Protein: 7.1 g/dL (ref 6.5–8.1)

## 2022-08-14 LAB — URINALYSIS, ROUTINE W REFLEX MICROSCOPIC
Bilirubin Urine: NEGATIVE
Glucose, UA: NEGATIVE mg/dL
Hgb urine dipstick: NEGATIVE
Ketones, ur: NEGATIVE mg/dL
Leukocytes,Ua: NEGATIVE
Nitrite: NEGATIVE
Protein, ur: NEGATIVE mg/dL
Specific Gravity, Urine: 1.005 (ref 1.005–1.030)
pH: 6 (ref 5.0–8.0)

## 2022-08-14 LAB — CBC
HCT: 39.1 % (ref 39.0–52.0)
Hemoglobin: 13.8 g/dL (ref 13.0–17.0)
MCH: 30.4 pg (ref 26.0–34.0)
MCHC: 35.3 g/dL (ref 30.0–36.0)
MCV: 86.1 fL (ref 80.0–100.0)
Platelets: 188 10*3/uL (ref 150–400)
RBC: 4.54 MIL/uL (ref 4.22–5.81)
RDW: 12.8 % (ref 11.5–15.5)
WBC: 6.4 10*3/uL (ref 4.0–10.5)
nRBC: 0 % (ref 0.0–0.2)

## 2022-08-14 LAB — TYPE AND SCREEN
ABO/RH(D): A POS
Antibody Screen: NEGATIVE

## 2022-08-14 LAB — SEDIMENTATION RATE: Sed Rate: 4 mm/hr (ref 0–20)

## 2022-08-14 LAB — SURGICAL PCR SCREEN
MRSA, PCR: NEGATIVE
Staphylococcus aureus: NEGATIVE

## 2022-08-14 LAB — HEMOGLOBIN A1C
Hgb A1c MFr Bld: 5.2 % (ref 4.8–5.6)
Mean Plasma Glucose: 102.54 mg/dL

## 2022-08-14 LAB — C-REACTIVE PROTEIN: CRP: 0.5 mg/dL (ref <1.0)

## 2022-08-14 NOTE — Progress Notes (Signed)
  [facility] Perioperative Services: Pre-Admission/Anesthesia Testing  Abnormal Lab Notification   Date: 08/14/22  Name: James Compton MRN:   [id number]  Re: Abnormal labs noted during PAT appointment   Notified:  Provider Name Provider Role Notification Mode  Skip Estimable, MD Orthopedics (surgeon) Routed and/or faxed via [place] and Notes:  ABNORMAL LAB VALUE(S): Lab Results  Component Value Date   K 3.1 (L) 08/14/2022   James Compton is scheduled for an elective COMPUTER ASSISTED LEFT TOTAL KNEE ARTHROPLASTY on 08/26/2022. In review of his medication reconciliation, it is noted that the patient is taking prescribed diuretic medications (HCTZ 12.5 mg daily). Please note, in efforts to promote a safe and effective anesthetic course, per current guidelines/standards set by the [facility] anesthesia team, the minimal acceptable K+ level for the patient to proceed with general anesthesia is 3.0 mmol/L. With that being said, if the patient drops any lower, his elective procedure will need to be postponed until K+ is better optimized. Abnormal result is being forwarded to primary attending surgeon for review and consideration of optimization. Order placed to have K+ rechecked on the day of his procedure to ensure correction of the noted derangement.    Honor Loh, MSN, APRN, FNP-C, CEN [facility]  Peri-operative Services Nurse Practitioner Phone: [phone] Fax: [phone] 08/14/22 3:56 PM

## 2022-08-14 NOTE — Patient Instructions (Addendum)
Your procedure is scheduled on: 08/26/22 - Monday Report to the Registration Desk on the 1st floor of the [facility]. To find out your arrival time, please call [phone] between 1PM - 3PM on: 08/23/22 - Friday If your arrival time is 6:00 am, do not arrive prior to that time as the [facility] entrance doors do not open until 6:00 am.  REMEMBER: Instructions that are not followed completely may result in serious medical risk, up to and including death; or upon the discretion of your surgeon and anesthesiologist your surgery may need to be rescheduled.  Do not eat food after midnight the night before surgery.  No gum chewing, lozengers or hard candies.  You may however, drink CLEAR liquids up to 2 hours before you are scheduled to arrive for your surgery. Do not drink anything within 2 hours of your scheduled arrival time.  Clear liquids include: - water  - apple juice without pulp - gatorade (not RED colors) - black coffee or tea (Do NOT add milk or creamers to the coffee or tea) Do NOT drink anything that is not on this list.  In addition, your doctor has ordered for you to drink the provided  Ensure Pre-Surgery Clear Carbohydrate Drink  Drinking this carbohydrate drink up to two hours before surgery helps to reduce insulin resistance and improve patient outcomes. Please complete drinking 2 hours prior to scheduled arrival time.  TAKE THESE MEDICATIONS THE MORNING OF SURGERY WITH A SIP OF WATER:  -omeprazole (PRILOSEC)  - (take one the night before and one on the morning of surgery - helps to prevent nausea after surgery.)   Follow recommendations from Cardiologist, Pulmonologist or PCP regarding stopping Aspirin, Coumadin, Plavix, Eliquis, Pradaxa, or Pletal. Follow up with your appointment with Cassell Smiles PA on 08/15/22.  One week prior to surgery beginning 08/19/22 : meloxicam (MOBIC) 15 MG tablet Stop Anti-inflammatories (NSAIDS) such as Advil, Aleve, Ibuprofen, Motrin,  Naproxen, Naprosyn and Aspirin based products such as Excedrin, Goodys Powder, BC Powder.  Stop ANY OVER THE COUNTER supplements beginning 08/19/22 until after surgery.Multiple Vitamin (MULTIVITAMIN), Coenzyme Q10 .  You may however, continue to take Tylenol if needed for pain up until the day of surgery.  No Alcohol for 24 hours before or after surgery.  No Smoking including e-cigarettes for 24 hours prior to surgery.  No chewable tobacco products for at least 6 hours prior to surgery.  No nicotine patches on the day of surgery.  Do not use any "recreational" drugs for at least a week prior to your surgery.  Please be advised that the combination of cocaine and anesthesia may have negative outcomes, up to and including death. If you test positive for cocaine, your surgery will be cancelled.  On the morning of surgery brush your teeth with toothpaste and water, you may rinse your mouth with mouthwash if you wish. Do not swallow any toothpaste or mouthwash.  Use CHG Soap or wipes as directed on instruction sheet.  Do not wear jewelry, make-up, hairpins, clips or nail polish.  Do not wear lotions, powders, or perfumes.   Do not shave body from the neck down 48 hours prior to surgery just in case you cut yourself which could leave a site for infection.  Also, freshly shaved skin may become irritated if using the CHG soap.  Contact lenses, hearing aids and dentures may not be worn into surgery.  Do not bring valuables to the hospital. [facility] is not responsible for any  missing/lost belongings or valuables.   Bring your C-PAP to the hospital with you in case you may have to spend the night.   Notify your doctor if there is any change in your medical condition (cold, fever, infection).  Wear comfortable clothing (specific to your surgery type) to the hospital.  After surgery, you can help prevent lung complications by doing breathing exercises.  Take deep breaths and cough every  1-2 hours. Your doctor may order a device called an Incentive Spirometer to help you take deep breaths. When coughing or sneezing, hold a pillow firmly against your incision with both hands. This is called "splinting." Doing this helps protect your incision. It also decreases belly discomfort.  If you are being admitted to the hospital overnight, leave your suitcase in the car. After surgery it may be brought to your room.  If you are being discharged the day of surgery, you will not be allowed to drive home. You will need a responsible adult (18 years or older) to drive you home and stay with you that night.   If you are taking public transportation, you will need to have a responsible adult (18 years or older) with you. Please confirm with your physician that it is acceptable to use public transportation.   Please call the [facility] Dept. at [phone] if you have any questions about these instructions.  Surgery Visitation Policy:  Patients undergoing a surgery or procedure may have two family members or support persons with them as long as the person is not COVID-19 positive or experiencing its symptoms.   Inpatient Visitation:    Visiting hours are 7 a.m. to 8 p.m. Up to four visitors are allowed at one time in a patient room, including children. The visitors may rotate out with other people during the day. One designated support person (adult) may remain overnight.

## 2022-08-16 LAB — IGE: IgE (Immunoglobulin E), Serum: 64 IU/mL (ref 6–495)

## 2022-08-23 NOTE — Anesthesia Preprocedure Evaluation (Signed)
Anesthesia Evaluation  Patient identified by MRN, date of birth, ID band Patient awake    Reviewed: Allergy & Precautions, NPO status , Patient's Chart, lab work & pertinent test results  History of Anesthesia Complications (+) PONV and history of anesthetic complications (bowels "feel asleep" after knee surgery)  Airway Mallampati: III  TM Distance: >3 FB Neck ROM: Full    Dental  (+) Partial Upper   Pulmonary sleep apnea and Continuous Positive Airway Pressure Ventilation , neg COPD, former smoker,    breath sounds clear to auscultation- rhonchi (-) wheezing      Cardiovascular Exercise Tolerance: Good hypertension, Pt. on medications (-) CAD, (-) Past MI, (-) Cardiac Stents and (-) CABG  Rhythm:Regular Rate:Normal - Systolic murmurs and - Diastolic murmurs Sinus Brady   Neuro/Psych neg Seizures TIAnegative psych ROS   GI/Hepatic Neg liver ROS, GERD  Medicated,  Endo/Other  negative endocrine ROSneg diabetes  Renal/GU negative Renal ROS     Musculoskeletal  (+) Arthritis ,   Abdominal (+) + obese,   Peds  Hematology   Anesthesia Other Findings Past Medical History: No date: Arthritis     Comment:  Osteo - knees No date: Complication of anesthesia     Comment:  bowels "feel asleep" after knee surgery No date: GERD (gastroesophageal reflux disease)     Comment:  occas No date: Hypertension No date: Pneumonia No date: PONV (postoperative nausea and vomiting) No date: Sleep apnea 04/11/2017: Status post arthroscopy     Comment:  Right Knee May 2018 Dr. Marry Guan 11/03/2016: TIA (transient ischemic attack) 01/2011: Vertigo     Comment:  1 time event, pt had drank several cups of coffee that               day. No date: Wears hearing aid     Comment:  bilateral No date: Wears partial dentures     Comment:  upper   Reproductive/Obstetrics                             Lab Results   Component Value Date   WBC 6.4 08/14/2022   HGB 13.8 08/14/2022   HCT 39.1 08/14/2022   MCV 86.1 08/14/2022   PLT 188 08/14/2022    Anesthesia Physical  Anesthesia Plan  ASA: III  Anesthesia Plan: Spinal   Post-op Pain Management:    Induction:   PONV Risk Score and Plan: 2 and Propofol infusion  Airway Management Planned: Natural Airway  Additional Equipment:   Intra-op Plan:   Post-operative Plan:   Informed Consent: I have reviewed the patients History and Physical, chart, labs and discussed the procedure including the risks, benefits and alternatives for the proposed anesthesia with the patient or authorized representative who has indicated his/her understanding and acceptance.     Dental advisory given  Plan Discussed with: CRNA and Anesthesiologist  Anesthesia Plan Comments: (Patient reports no bleeding problems and no anticoagulant use.  Plan for spinal with backup GA  Patient consented for risks of anesthesia including but not limited to:  - adverse reactions to medications - damage to eyes, teeth, lips or other oral mucosa - nerve damage due to positioning  - risk of bleeding, infection and or nerve damage from spinal that could lead to paralysis - risk of headache or failed spinal - damage to teeth, lips or other oral mucosa - sore throat or hoarseness - damage to heart, brain, nerves, lungs, other parts of  body or loss of life  Patient voiced understanding.)       Anesthesia Quick Evaluation

## 2022-08-25 ENCOUNTER — Encounter: Payer: Self-pay | Admitting: Orthopedic Surgery

## 2022-08-25 MED ORDER — LACTATED RINGERS IV SOLN: INTRAVENOUS | Status: DC

## 2022-08-25 MED ORDER — ORAL CARE MOUTH RINSE: 15.0000 mL | Freq: Once | OROMUCOSAL | Status: AC

## 2022-08-25 MED ORDER — CHLORHEXIDINE GLUCONATE 0.12 % MT SOLN: 15.0000 mL | Freq: Once | OROMUCOSAL | Status: AC

## 2022-08-25 MED ORDER — CELECOXIB 200 MG PO CAPS
400.0000 mg | ORAL_CAPSULE | Freq: Once | ORAL | Status: AC
  Administered 2022-08-26: 400 mg via ORAL

## 2022-08-25 MED ORDER — GABAPENTIN 300 MG PO CAPS: 300.0000 mg | ORAL_CAPSULE | Freq: Once | ORAL | Status: AC

## 2022-08-25 MED ORDER — CEFAZOLIN SODIUM-DEXTROSE 2-4 GM/100ML-% IV SOLN
2.0000 g | INTRAVENOUS | Status: AC
  Administered 2022-08-26: 2 g via INTRAVENOUS

## 2022-08-25 MED ORDER — TRANEXAMIC ACID-NACL 1000-0.7 MG/100ML-% IV SOLN
1000.0000 mg | INTRAVENOUS | Status: AC
  Administered 2022-08-26: 1000 mg via INTRAVENOUS

## 2022-08-25 MED ORDER — DEXAMETHASONE SODIUM PHOSPHATE 10 MG/ML IJ SOLN: 8.0000 mg | Freq: Once | INTRAMUSCULAR | Status: AC

## 2022-08-25 MED ORDER — CHLORHEXIDINE GLUCONATE 4 % EX LIQD: 60.0000 mL | Freq: Once | CUTANEOUS | Status: DC

## 2022-08-25 NOTE — H&P (Signed)
ORTHOPAEDIC HISTORY & PHYSICAL Gwenlyn Fudge, [place] - 08/15/2022 8:45 AM EDT Formatting of this note is different from the original. [facility] Chief Complaint:   Chief Complaint  Patient presents with  Knee Pain  H & P LEFT KNEE   History of Present Illness:   James Compton is a 71 y.o. male that presents to clinic today for his preoperative history and evaluation. Patient presents unaccompanied. The patient is scheduled to undergo a left total knee arthroplasty on 08/26/22 by Dr. Marry Guan. His pain began around 1 year ago. The pain is located primarily along the medial aspect of the knee. He describes his pain as worse with weightbearing. He reports associated swelling with some giving way of the knee. He denies associated numbness or tingling, denies locking.   The patient's symptoms have progressed to the point that they decrease his quality of life. The patient has previously undergone conservative treatment including NSAIDS and injections to the knee without adequate control of his symptoms.  Patient denies history of lumbar surgery, denies history of DVT, denies significant cardiac history. Does take 325 ASA for TIA sustained 2017.  Past Medical, Surgical, Family, Social History, Allergies, Medications:   Past Medical History:  Past Medical History:  Diagnosis Date  Arthritis  Chicken pox  Diabetes mellitus type 2, uncomplicated (CMS-HCC)  GERD (gastroesophageal reflux disease)  Hyperlipidemia  Hypertension  Sleep apnea  Stroke (CMS-HCC)  TIA (transient ischemic attack)   Past Surgical History:  Past Surgical History:  Procedure Laterality Date  Right knee arthroscopy, partial medial meniscetomy, and chondroplasty 04/02/2017  Dr Marry Guan  Right total knee arthroplasty using computer-assisted navigation 11/30/2018  Dr Marry Guan  JOINT REPLACEMENT 01 09 2020  [facility] ARTHROSCOPY Right   Current Medications:   Current Outpatient Medications  Medication Sig Dispense Refill  aspirin 325 MG tablet Take 1 tablet (325 mg total) by mouth once daily  atorvastatin (LIPITOR) 80 MG tablet 1 tablet (80 mg total) once daily  cetirizine (ZYRTEC) 10 MG tablet Take 1 tablet (10 mg total) by mouth once daily  co-enzyme Q-10, ubiquinone, 200 mg capsule Take 400 mg by mouth once daily  gentamicin (GARAMYCIN) 0.1 % ointment Apply topically 3 (three) times daily  Lactobac 40-Bifido 3-S.thermop 100 billion cell Cap Take 1 capsule by mouth once daily.   losartan-hydrochlorothiazide (HYZAAR) 50-12.5 mg tablet Take 1 tablet by mouth once daily  meloxicam (MOBIC) 15 MG tablet Take 15 mg by mouth once daily as needed  multivitamin capsule Take 1 capsule by mouth once daily  omeprazole (PRILOSEC) 20 MG DR capsule Take 20 mg by mouth once daily  guaifenesin/pseudoephedrne HCl (MUCINEX D ORAL) Take by mouth  omeprazole (PRILOSEC) 40 MG DR capsule 1 capsule (40 mg total) once daily as needed   No current facility-administered medications for this visit.   Allergies:  Allergies  Allergen Reactions  Dipyridamole Nausea and Headache  Penicillins Nausea   Social History:  Social History   Socioeconomic History  Marital status: Married  Spouse name: Doroteo Bradford  Number of children: 3  Years of education: 7  Occupational History  Occupation: Theme park manager  Tobacco Use  Smoking status: Former  Packs/day: 2.00  Years: 5.00  Pack years: 10.00  Types: Cigarettes  Quit date: 11/25/1970  Years since quitting: 51.7  Smokeless tobacco: Never  Vaping Use  Vaping Use: Never used  Substance and Sexual Activity  Alcohol use: No  Drug use: No  Sexual activity: Yes  Partners: Female   Family History:  Family History  Problem Relation Age of Onset  Stroke Mother  Alzheimer's disease Mother  Heart disease Father  Prostate cancer Father  No Known Problems Sister  No Known Problems Brother   Review of Systems:   A 10+ ROS  was performed, reviewed, and the pertinent orthopaedic findings are documented in the HPI.   Physical Examination:   BP (!) 146/72 (BP Location: Left upper arm, Patient Position: Sitting, BP Cuff Size: Adult)  Ht 177.8 cm ('5\' 10"'$ )  Wt 92.6 kg (204 lb 3.2 oz)  BMI 29.30 kg/m   Patient is a well-developed, well-nourished male in no acute distress. Patient has normal mood and affect. Patient is alert and oriented to person, place, and time.   HEENT: Atraumatic, normocephalic. Pupils equal and reactive to light. Extraocular motion intact. Noninjected sclera.  Cardiovascular: Regular rate and rhythm, with no murmurs, rubs, or gallops. Distal pulses palpable. No carotid bruits.  Respiratory: Lungs clear to auscultation bilaterally.   Left Knee: Soft tissue swelling: mild Effusion: none Erythema: none Crepitance: mild Tenderness: medial Alignment: relative varus Mediolateral laxity: medial pseudolaxity Posterior sag: negative Patellar tracking: Good tracking without evidence of subluxation or tilt Atrophy: No significant atrophy.  Quadriceps tone was fair to good. Range of motion: 0/5/122 degrees  Able to actively plantarflex and dorsiflex the left ankle. Able to flex and extend the toes.  Sensation intact over the saphenous, lateral sural cutaneous, superficial fibular, and deep fibular nerve distributions.  Tests Performed/Reviewed:  X-rays  Previous x-rays of the left knee were reviewed and show severe loss of medial compartment joint space with osteophyte formation.  Impression:   ICD-10-CM  1. Primary osteoarthritis of left knee M17.12   Plan:   The patient has end-stage degenerative changes of the left knee. It was explained to the patient that the condition is progressive in nature. Having failed conservative treatment, the patient has elected to proceed with a total joint arthroplasty. The patient will undergo a total joint arthroplasty with Dr. Marry Guan. The risks of  surgery, including blood clot and infection, were discussed with the patient. Measures to reduce these risks, including the use of anticoagulation, perioperative antibiotics, and early ambulation were discussed. The importance of postoperative physical therapy was discussed with the patient. The patient elects to proceed with surgery. The patient is instructed to stop all blood thinners prior to surgery. The patient is instructed to call the hospital the day before surgery to learn of the proper arrival time.   Contact our office with any questions or concerns. Follow up as indicated, or sooner should any new problems arise, if conditions worsen, or if they are otherwise concerned.   Gwenlyn Fudge, [facility] [place], [place] [address] Phone: [phone]  This note was generated in part with voice recognition software and I apologize for any typographical errors that were not detected and corrected.  Electronically signed by Gwenlyn Fudge, PA at 08/15/2022 12:40 PM EDT

## 2022-08-26 ENCOUNTER — Observation Stay: Payer: Medicare Other

## 2022-08-26 ENCOUNTER — Ambulatory Visit: Payer: Medicare Other | Admitting: Urgent Care

## 2022-08-26 ENCOUNTER — Observation Stay
Admission: RE | Admit: 2022-08-26 | Discharge: 2022-08-27 | Disposition: A | Discharge status: Home / Self Care | Payer: Medicare Other | Source: Ambulatory Visit | Attending: Orthopedic Surgery | Admitting: Orthopedic Surgery

## 2022-08-26 ENCOUNTER — Other Ambulatory Visit: Payer: Self-pay

## 2022-08-26 ENCOUNTER — Encounter: Payer: Self-pay | Admitting: Orthopedic Surgery

## 2022-08-26 ENCOUNTER — Encounter
Admission: RE | Disposition: A | Discharge status: Home / Self Care | Payer: Self-pay | Source: Ambulatory Visit | Attending: Orthopedic Surgery

## 2022-08-26 ENCOUNTER — Ambulatory Visit: Payer: Medicare Other | Admitting: Anesthesiology

## 2022-08-26 DIAGNOSIS — Z87891 Personal history of nicotine dependence: Secondary | ICD-10-CM | POA: Insufficient documentation

## 2022-08-26 DIAGNOSIS — Z01812 Encounter for preprocedural laboratory examination: Secondary | ICD-10-CM

## 2022-08-26 DIAGNOSIS — Z23 Encounter for immunization: Secondary | ICD-10-CM | POA: Insufficient documentation

## 2022-08-26 DIAGNOSIS — I10 Essential (primary) hypertension: Secondary | ICD-10-CM | POA: Diagnosis not present

## 2022-08-26 DIAGNOSIS — E119 Type 2 diabetes mellitus without complications: Secondary | ICD-10-CM | POA: Insufficient documentation

## 2022-08-26 DIAGNOSIS — Z96651 Presence of right artificial knee joint: Secondary | ICD-10-CM | POA: Diagnosis not present

## 2022-08-26 DIAGNOSIS — M1712 Unilateral primary osteoarthritis, left knee: Secondary | ICD-10-CM | POA: Diagnosis present

## 2022-08-26 DIAGNOSIS — Z79899 Other long term (current) drug therapy: Secondary | ICD-10-CM | POA: Insufficient documentation

## 2022-08-26 DIAGNOSIS — Z8673 Personal history of transient ischemic attack (TIA), and cerebral infarction without residual deficits: Secondary | ICD-10-CM | POA: Insufficient documentation

## 2022-08-26 DIAGNOSIS — Z880 Allergy status to penicillin: Secondary | ICD-10-CM

## 2022-08-26 DIAGNOSIS — Z96659 Presence of unspecified artificial knee joint: Secondary | ICD-10-CM

## 2022-08-26 DIAGNOSIS — Z7982 Long term (current) use of aspirin: Secondary | ICD-10-CM | POA: Insufficient documentation

## 2022-08-26 HISTORY — PX: KNEE ARTHROPLASTY: SHX992

## 2022-08-26 LAB — POCT I-STAT, CHEM 8
BUN: 14 mg/dL (ref 8–23)
Calcium, Ion: 1.18 mmol/L (ref 1.15–1.40)
Chloride: 103 mmol/L (ref 98–111)
Creatinine, Ser: 0.8 mg/dL (ref 0.61–1.24)
Glucose, Bld: 100 mg/dL — ABNORMAL HIGH (ref 70–99)
HCT: 42 % (ref 39.0–52.0)
Hemoglobin: 14.3 g/dL (ref 13.0–17.0)
Potassium: 3.4 mmol/L — ABNORMAL LOW (ref 3.5–5.1)
Sodium: 142 mmol/L (ref 135–145)
TCO2: 25 mmol/L (ref 22–32)

## 2022-08-26 SURGERY — ARTHROPLASTY, KNEE, TOTAL, USING IMAGELESS COMPUTER-ASSISTED NAVIGATION
Anesthesia: Spinal | Site: Knee | Laterality: Left

## 2022-08-26 MED ORDER — BUPIVACAINE HCL (PF) 0.5 % IJ SOLN
INTRAMUSCULAR | Status: DC | PRN
  Administered 2022-08-26: 2.5 mL

## 2022-08-26 MED ORDER — ONDANSETRON HCL 4 MG/2ML IJ SOLN
INTRAMUSCULAR | Status: DC | PRN
  Administered 2022-08-26: 4 mg via INTRAVENOUS

## 2022-08-26 MED ORDER — ACETAMINOPHEN 10 MG/ML IV SOLN
INTRAVENOUS | Status: DC | PRN
  Administered 2022-08-26: 1000 mg via INTRAVENOUS

## 2022-08-26 MED ORDER — SODIUM CHLORIDE 0.9 % IV SOLN
INTRAVENOUS | Status: DC | PRN
  Administered 2022-08-26: 60 mL

## 2022-08-26 MED ORDER — SODIUM CHLORIDE 0.9 % IV SOLN: INTRAVENOUS | Status: DC

## 2022-08-26 MED ORDER — OXYCODONE HCL 5 MG PO TABS
5.0000 mg | ORAL_TABLET | ORAL | Status: DC | PRN
  Administered 2022-08-26: 5 mg via ORAL
  Filled 2022-08-26: qty 1

## 2022-08-26 MED ORDER — ALUM & MAG HYDROXIDE-SIMETH 200-200-20 MG/5ML PO SUSP: 30.0000 mL | ORAL | Status: DC | PRN

## 2022-08-26 MED ORDER — SURGIPHOR WOUND IRRIGATION SYSTEM - OPTIME
TOPICAL | Status: DC | PRN
  Administered 2022-08-26: 1 via TOPICAL

## 2022-08-26 MED ORDER — PROPOFOL 1000 MG/100ML IV EMUL
INTRAVENOUS | Status: AC
  Filled 2022-08-26: qty 100

## 2022-08-26 MED ORDER — BUPIVACAINE HCL (PF) 0.5 % IJ SOLN
INTRAMUSCULAR | Status: AC
  Filled 2022-08-26: qty 20

## 2022-08-26 MED ORDER — PANTOPRAZOLE SODIUM 40 MG PO TBEC
40.0000 mg | DELAYED_RELEASE_TABLET | Freq: Two times a day (BID) | ORAL | Status: DC
  Administered 2022-08-26 – 2022-08-27 (×3): 40 mg via ORAL
  Filled 2022-08-26 (×3): qty 1

## 2022-08-26 MED ORDER — PHENYLEPHRINE HCL-NACL 20-0.9 MG/250ML-% IV SOLN
INTRAVENOUS | Status: AC
  Filled 2022-08-26: qty 250

## 2022-08-26 MED ORDER — FENTANYL CITRATE (PF) 100 MCG/2ML IJ SOLN: 25.0000 ug | INTRAMUSCULAR | Status: DC | PRN

## 2022-08-26 MED ORDER — PROPOFOL 500 MG/50ML IV EMUL
INTRAVENOUS | Status: DC | PRN
  Administered 2022-08-26: 100 ug/kg/min via INTRAVENOUS

## 2022-08-26 MED ORDER — MIDAZOLAM HCL 5 MG/5ML IJ SOLN
INTRAMUSCULAR | Status: DC | PRN
  Administered 2022-08-26: 2 mg via INTRAVENOUS

## 2022-08-26 MED ORDER — BUPIVACAINE LIPOSOME 1.3 % IJ SUSP
INTRAMUSCULAR | Status: AC
  Filled 2022-08-26: qty 20

## 2022-08-26 MED ORDER — CELECOXIB 200 MG PO CAPS
200.0000 mg | ORAL_CAPSULE | Freq: Two times a day (BID) | ORAL | Status: DC
  Administered 2022-08-27: 200 mg via ORAL
  Filled 2022-08-26: qty 1

## 2022-08-26 MED ORDER — CEFAZOLIN SODIUM-DEXTROSE 2-4 GM/100ML-% IV SOLN
INTRAVENOUS | Status: AC
  Filled 2022-08-26: qty 100

## 2022-08-26 MED ORDER — FENTANYL CITRATE (PF) 100 MCG/2ML IJ SOLN
INTRAMUSCULAR | Status: AC
  Filled 2022-08-26: qty 2

## 2022-08-26 MED ORDER — OXYCODONE HCL 5 MG PO TABS
10.0000 mg | ORAL_TABLET | ORAL | Status: DC | PRN
  Administered 2022-08-26 – 2022-08-27 (×2): 10 mg via ORAL
  Filled 2022-08-26 (×2): qty 2

## 2022-08-26 MED ORDER — OXYCODONE HCL 5 MG/5ML PO SOLN: 5.0000 mg | Freq: Once | ORAL | Status: DC | PRN

## 2022-08-26 MED ORDER — FENTANYL CITRATE (PF) 100 MCG/2ML IJ SOLN
INTRAMUSCULAR | Status: DC | PRN
  Administered 2022-08-26: 25 ug via INTRAVENOUS

## 2022-08-26 MED ORDER — OXYCODONE HCL 5 MG PO TABS: 5.0000 mg | ORAL_TABLET | Freq: Once | ORAL | Status: DC | PRN

## 2022-08-26 MED ORDER — SODIUM CHLORIDE 0.9 % IR SOLN
Status: DC | PRN
  Administered 2022-08-26: 3000 mL

## 2022-08-26 MED ORDER — GABAPENTIN 300 MG PO CAPS
ORAL_CAPSULE | ORAL | Status: AC
  Administered 2022-08-26: 300 mg via ORAL
  Filled 2022-08-26: qty 1

## 2022-08-26 MED ORDER — TRANEXAMIC ACID-NACL 1000-0.7 MG/100ML-% IV SOLN
INTRAVENOUS | Status: AC
  Filled 2022-08-26: qty 100

## 2022-08-26 MED ORDER — CELECOXIB 200 MG PO CAPS
ORAL_CAPSULE | ORAL | Status: AC
  Filled 2022-08-26: qty 2

## 2022-08-26 MED ORDER — ATORVASTATIN CALCIUM 20 MG PO TABS
80.0000 mg | ORAL_TABLET | Freq: Every evening | ORAL | Status: DC
  Administered 2022-08-26 – 2022-08-27 (×2): 80 mg via ORAL
  Filled 2022-08-26 (×2): qty 4

## 2022-08-26 MED ORDER — TRANEXAMIC ACID-NACL 1000-0.7 MG/100ML-% IV SOLN: 1000.0000 mg | Freq: Once | INTRAVENOUS | Status: AC

## 2022-08-26 MED ORDER — METOCLOPRAMIDE HCL 5 MG PO TABS
10.0000 mg | ORAL_TABLET | Freq: Three times a day (TID) | ORAL | Status: DC
  Administered 2022-08-26 – 2022-08-27 (×5): 10 mg via ORAL
  Filled 2022-08-26 (×5): qty 2

## 2022-08-26 MED ORDER — DEXAMETHASONE SODIUM PHOSPHATE 10 MG/ML IJ SOLN
INTRAMUSCULAR | Status: AC
  Administered 2022-08-26: 8 mg via INTRAVENOUS
  Filled 2022-08-26: qty 1

## 2022-08-26 MED ORDER — HYDROMORPHONE HCL 1 MG/ML IJ SOLN: 0.5000 mg | INTRAMUSCULAR | Status: DC | PRN

## 2022-08-26 MED ORDER — ACETAMINOPHEN 10 MG/ML IV SOLN
INTRAVENOUS | Status: AC
  Filled 2022-08-26: qty 100

## 2022-08-26 MED ORDER — INFLUENZA VAC A&B SA ADJ QUAD 0.5 ML IM PRSY
0.5000 mL | PREFILLED_SYRINGE | INTRAMUSCULAR | Status: AC
  Administered 2022-08-27: 0.5 mL via INTRAMUSCULAR
  Filled 2022-08-26 (×2): qty 0.5

## 2022-08-26 MED ORDER — ONDANSETRON HCL 4 MG PO TABS: 4.0000 mg | ORAL_TABLET | Freq: Four times a day (QID) | ORAL | Status: DC | PRN

## 2022-08-26 MED ORDER — BUPIVACAINE HCL (PF) 0.25 % IJ SOLN
INTRAMUSCULAR | Status: AC
  Filled 2022-08-26: qty 60

## 2022-08-26 MED ORDER — PROPOFOL 10 MG/ML IV BOLUS
INTRAVENOUS | Status: DC | PRN
  Administered 2022-08-26: 60 mg via INTRAVENOUS

## 2022-08-26 MED ORDER — CEFAZOLIN SODIUM-DEXTROSE 2-4 GM/100ML-% IV SOLN
2.0000 g | Freq: Four times a day (QID) | INTRAVENOUS | Status: AC
  Administered 2022-08-26 (×2): 2 g via INTRAVENOUS
  Filled 2022-08-26 (×2): qty 100

## 2022-08-26 MED ORDER — MAGNESIUM HYDROXIDE 400 MG/5ML PO SUSP
30.0000 mL | Freq: Every day | ORAL | Status: DC
  Administered 2022-08-26 – 2022-08-27 (×2): 30 mL via ORAL
  Filled 2022-08-26 (×2): qty 30

## 2022-08-26 MED ORDER — DIPHENHYDRAMINE HCL 12.5 MG/5ML PO ELIX: 12.5000 mg | ORAL_SOLUTION | ORAL | Status: DC | PRN

## 2022-08-26 MED ORDER — LORATADINE 10 MG PO TABS
10.0000 mg | ORAL_TABLET | Freq: Every day | ORAL | Status: DC
  Administered 2022-08-26: 10 mg via ORAL
  Filled 2022-08-26 (×2): qty 1

## 2022-08-26 MED ORDER — CHLORHEXIDINE GLUCONATE 0.12 % MT SOLN
OROMUCOSAL | Status: AC
  Administered 2022-08-26: 15 mL via OROMUCOSAL
  Filled 2022-08-26: qty 15

## 2022-08-26 MED ORDER — ENOXAPARIN SODIUM 30 MG/0.3ML IJ SOSY
30.0000 mg | PREFILLED_SYRINGE | Freq: Two times a day (BID) | INTRAMUSCULAR | Status: DC
  Administered 2022-08-27 (×2): 30 mg via SUBCUTANEOUS
  Filled 2022-08-26 (×2): qty 0.3

## 2022-08-26 MED ORDER — LOSARTAN POTASSIUM 50 MG PO TABS
50.0000 mg | ORAL_TABLET | Freq: Every day | ORAL | Status: DC
  Administered 2022-08-26 – 2022-08-27 (×2): 50 mg via ORAL
  Filled 2022-08-26 (×2): qty 1

## 2022-08-26 MED ORDER — KETOCONAZOLE 2 % EX CREA: 1.0000 | TOPICAL_CREAM | Freq: Every day | CUTANEOUS | Status: DC | PRN

## 2022-08-26 MED ORDER — ACETAMINOPHEN 325 MG PO TABS: 325.0000 mg | ORAL_TABLET | Freq: Four times a day (QID) | ORAL | Status: DC | PRN

## 2022-08-26 MED ORDER — PHENYLEPHRINE HCL-NACL 20-0.9 MG/250ML-% IV SOLN
INTRAVENOUS | Status: DC | PRN
  Administered 2022-08-26: 30 ug/min via INTRAVENOUS

## 2022-08-26 MED ORDER — MIDAZOLAM HCL 2 MG/2ML IJ SOLN
INTRAMUSCULAR | Status: AC
  Filled 2022-08-26: qty 2

## 2022-08-26 MED ORDER — 0.9 % SODIUM CHLORIDE (POUR BTL) OPTIME
TOPICAL | Status: DC | PRN
  Administered 2022-08-26: 500 mL

## 2022-08-26 MED ORDER — ACETAMINOPHEN 10 MG/ML IV SOLN
1000.0000 mg | Freq: Four times a day (QID) | INTRAVENOUS | Status: AC
  Administered 2022-08-26 – 2022-08-27 (×4): 1000 mg via INTRAVENOUS
  Filled 2022-08-26 (×4): qty 100

## 2022-08-26 MED ORDER — ONDANSETRON HCL 4 MG/2ML IJ SOLN
4.0000 mg | Freq: Four times a day (QID) | INTRAMUSCULAR | Status: DC | PRN
  Administered 2022-08-26: 4 mg via INTRAVENOUS
  Filled 2022-08-26 (×2): qty 2

## 2022-08-26 MED ORDER — RISAQUAD PO CAPS
2.0000 | ORAL_CAPSULE | Freq: Every day | ORAL | Status: DC
  Administered 2022-08-26 – 2022-08-27 (×2): 2 via ORAL
  Filled 2022-08-26 (×2): qty 2

## 2022-08-26 MED ORDER — ONDANSETRON HCL 4 MG/2ML IJ SOLN
INTRAMUSCULAR | Status: AC
  Filled 2022-08-26: qty 2

## 2022-08-26 MED ORDER — BISACODYL 10 MG RE SUPP: 10.0000 mg | Freq: Every day | RECTAL | Status: DC | PRN

## 2022-08-26 MED ORDER — MENTHOL 3 MG MT LOZG: 1.0000 | LOZENGE | OROMUCOSAL | Status: DC | PRN

## 2022-08-26 MED ORDER — PHENYLEPHRINE HCL (PRESSORS) 10 MG/ML IV SOLN: INTRAVENOUS | Status: DC | PRN

## 2022-08-26 MED ORDER — SENNOSIDES-DOCUSATE SODIUM 8.6-50 MG PO TABS
1.0000 | ORAL_TABLET | Freq: Two times a day (BID) | ORAL | Status: DC
  Administered 2022-08-26 – 2022-08-27 (×3): 1 via ORAL
  Filled 2022-08-26 (×3): qty 1

## 2022-08-26 MED ORDER — LOSARTAN POTASSIUM-HCTZ 50-12.5 MG PO TABS: 1.0000 | ORAL_TABLET | Freq: Every day | ORAL | Status: DC

## 2022-08-26 MED ORDER — SODIUM CHLORIDE FLUSH 0.9 % IV SOLN
INTRAVENOUS | Status: AC
  Filled 2022-08-26: qty 10

## 2022-08-26 MED ORDER — HYDROCHLOROTHIAZIDE 12.5 MG PO TABS
12.5000 mg | ORAL_TABLET | Freq: Every day | ORAL | Status: DC
  Administered 2022-08-26 – 2022-08-27 (×2): 12.5 mg via ORAL
  Filled 2022-08-26 (×2): qty 1

## 2022-08-26 MED ORDER — TRAMADOL HCL 50 MG PO TABS
50.0000 mg | ORAL_TABLET | ORAL | Status: DC | PRN
  Administered 2022-08-26: 100 mg via ORAL
  Filled 2022-08-26: qty 2

## 2022-08-26 MED ORDER — FLUTICASONE PROPIONATE 50 MCG/ACT NA SUSP: 2.0000 | Freq: Every day | NASAL | Status: DC | PRN

## 2022-08-26 MED ORDER — FERROUS SULFATE 325 (65 FE) MG PO TABS
325.0000 mg | ORAL_TABLET | Freq: Two times a day (BID) | ORAL | Status: DC
  Administered 2022-08-26 – 2022-08-27 (×3): 325 mg via ORAL
  Filled 2022-08-26 (×3): qty 1

## 2022-08-26 MED ORDER — BUPIVACAINE HCL (PF) 0.25 % IJ SOLN
INTRAMUSCULAR | Status: DC | PRN
  Administered 2022-08-26: 60 mL

## 2022-08-26 MED ORDER — PHENOL 1.4 % MT LIQD: 1.0000 | OROMUCOSAL | Status: DC | PRN

## 2022-08-26 MED ORDER — ADULT MULTIVITAMIN W/MINERALS CH
1.0000 | ORAL_TABLET | Freq: Every day | ORAL | Status: DC
  Administered 2022-08-26 – 2022-08-27 (×2): 1 via ORAL
  Filled 2022-08-26 (×2): qty 1

## 2022-08-26 MED ORDER — TRANEXAMIC ACID-NACL 1000-0.7 MG/100ML-% IV SOLN
INTRAVENOUS | Status: AC
  Administered 2022-08-26: 1000 mg via INTRAVENOUS
  Filled 2022-08-26: qty 100

## 2022-08-26 MED ORDER — FLEET ENEMA 7-19 GM/118ML RE ENEM: 1.0000 | ENEMA | Freq: Once | RECTAL | Status: DC | PRN

## 2022-08-26 SURGICAL SUPPLY — 76 items
ATTUNE MED DOME PAT 41 KNEE (Knees) IMPLANT
ATTUNE PS FEM LT SZ 7 CEM KNEE (Femur) IMPLANT
ATTUNE PSRP INSR SZ7 5 KNEE (Insert) IMPLANT
BASE TIBIA ATTUNE KNEE SYS SZ6 (Knees) IMPLANT
BATTERY INSTRU NAVIGATION (MISCELLANEOUS) ×4 IMPLANT
BLADE SAW 70X12.5 (BLADE) ×1 IMPLANT
BLADE SAW 90X13X1.19 OSCILLAT (BLADE) ×1 IMPLANT
BLADE SAW 90X25X1.19 OSCILLAT (BLADE) ×1 IMPLANT
BONE CEMENT GENTAMICIN (Cement) ×2 IMPLANT
CEMENT BONE GENTAMICIN 40 (Cement) IMPLANT
CEMENT HV SMART SET (Cement) IMPLANT
COOLER POLAR GLACIER W/PUMP (MISCELLANEOUS) ×1 IMPLANT
CUFF TOURN SGL QUICK 24 (TOURNIQUET CUFF)
CUFF TOURN SGL QUICK 34 (TOURNIQUET CUFF)
CUFF TRNQT CYL 24X4X16.5-23 (TOURNIQUET CUFF) IMPLANT
CUFF TRNQT CYL 34X4.125X (TOURNIQUET CUFF) IMPLANT
DRAPE 3/4 80X56 (DRAPES) ×1 IMPLANT
DRAPE INCISE IOBAN 66X45 STRL (DRAPES) IMPLANT
DRSG DERMACEA 8X12 NADH (GAUZE/BANDAGES/DRESSINGS) IMPLANT
DRSG DERMACEA NONADH 3X8 (GAUZE/BANDAGES/DRESSINGS) ×1 IMPLANT
DRSG MEPILEX SACRM 8.7X9.8 (GAUZE/BANDAGES/DRESSINGS) ×1 IMPLANT
DRSG OPSITE POSTOP 4X14 (GAUZE/BANDAGES/DRESSINGS) ×1 IMPLANT
DRSG TEGADERM 4X4.75 (GAUZE/BANDAGES/DRESSINGS) ×1 IMPLANT
DURAPREP 26ML APPLICATOR (WOUND CARE) ×2 IMPLANT
ELECT CAUTERY BLADE 6.4 (BLADE) ×1 IMPLANT
ELECT REM PT RETURN 9FT ADLT (ELECTROSURGICAL) ×1
ELECTRODE REM PT RTRN 9FT ADLT (ELECTROSURGICAL) ×1 IMPLANT
EX-PIN ORTHOLOCK NAV 4X150 (PIN) ×2 IMPLANT
GLOVE BIO SURGEON STRL SZ7 (GLOVE) ×2 IMPLANT
GLOVE BIOGEL M STRL SZ7.5 (GLOVE) ×2 IMPLANT
GLOVE BIOGEL PI IND STRL 8 (GLOVE) ×1 IMPLANT
GLOVE PI ORTHO PRO STRL 7.5 (GLOVE) ×2 IMPLANT
GLOVE SURG UNDER POLY LF SZ7.5 (GLOVE) ×1 IMPLANT
GOWN STRL REUS W/ TWL LRG LVL3 (GOWN DISPOSABLE) ×2 IMPLANT
GOWN STRL REUS W/ TWL XL LVL3 (GOWN DISPOSABLE) ×1 IMPLANT
GOWN STRL REUS W/TWL LRG LVL3 (GOWN DISPOSABLE) ×2
GOWN STRL REUS W/TWL XL LVL3 (GOWN DISPOSABLE) ×1
HEMOVAC 400CC 10FR (MISCELLANEOUS) ×1 IMPLANT
HOLDER FOLEY CATH W/STRAP (MISCELLANEOUS) ×1 IMPLANT
HOLSTER ELECTROSUGICAL PENCIL (MISCELLANEOUS) ×1 IMPLANT
HOOD PEEL AWAY FLYTE STAYCOOL (MISCELLANEOUS) ×2 IMPLANT
IV NS IRRIG 3000ML ARTHROMATIC (IV SOLUTION) ×1 IMPLANT
KIT TURNOVER KIT A (KITS) ×1 IMPLANT
KNIFE SCULPS 14X20 (INSTRUMENTS) ×1 IMPLANT
MANIFOLD NEPTUNE II (INSTRUMENTS) ×2 IMPLANT
NDL SPNL 20GX3.5 QUINCKE YW (NEEDLE) ×2 IMPLANT
NEEDLE SPNL 20GX3.5 QUINCKE YW (NEEDLE) ×2 IMPLANT
NS IRRIG 500ML POUR BTL (IV SOLUTION) ×1 IMPLANT
PACK TOTAL KNEE (MISCELLANEOUS) ×1 IMPLANT
PAD ABD DERMACEA PRESS 5X9 (GAUZE/BANDAGES/DRESSINGS) ×2 IMPLANT
PAD WRAPON POLAR KNEE (MISCELLANEOUS) ×1 IMPLANT
PIN DRILL FIX HALF THREAD (BIT) ×2 IMPLANT
PIN DRILL QUICK PACK ×2 IMPLANT
PIN FIXATION 1/8DIA X 3INL (PIN) ×1 IMPLANT
PULSAVAC PLUS IRRIG FAN TIP (DISPOSABLE) ×1
SOL PREP PVP 2OZ (MISCELLANEOUS) ×1
SOLUTION IRRIG SURGIPHOR (IV SOLUTION) ×1 IMPLANT
SOLUTION PREP PVP 2OZ (MISCELLANEOUS) ×1 IMPLANT
SPONGE DRAIN TRACH 4X4 STRL 2S (GAUZE/BANDAGES/DRESSINGS) ×1 IMPLANT
STAPLER SKIN PROX 35W (STAPLE) ×1 IMPLANT
STOCKINETTE IMPERV 14X48 (MISCELLANEOUS) IMPLANT
STRAP TIBIA SHORT (MISCELLANEOUS) ×1 IMPLANT
SUCTION FRAZIER HANDLE 10FR (MISCELLANEOUS) ×1
SUCTION TUBE FRAZIER 10FR DISP (MISCELLANEOUS) ×1 IMPLANT
SUT VIC AB 0 CT1 36 (SUTURE) ×2 IMPLANT
SUT VIC AB 1 CT1 36 (SUTURE) ×2 IMPLANT
SUT VIC AB 2-0 CT2 27 (SUTURE) ×1 IMPLANT
SYR 30ML LL (SYRINGE) ×2 IMPLANT
TIBIA ATTUNE KNEE SYS BASE SZ6 (Knees) ×1 IMPLANT
TIP FAN IRRIG PULSAVAC PLUS (DISPOSABLE) ×1 IMPLANT
TOWEL OR 17X26 4PK STRL BLUE (TOWEL DISPOSABLE) IMPLANT
TOWER CARTRIDGE SMART MIX (DISPOSABLE) ×1 IMPLANT
TRAP FLUID SMOKE EVACUATOR (MISCELLANEOUS) ×1 IMPLANT
TRAY FOLEY MTR SLVR 16FR STAT (SET/KITS/TRAYS/PACK) ×1 IMPLANT
WATER STERILE IRR 500ML POUR (IV SOLUTION) ×1 IMPLANT
WRAPON POLAR PAD KNEE (MISCELLANEOUS) ×1

## 2022-08-26 NOTE — Evaluation (Signed)
Physical Therapy Evaluation Patient Details Name: James Compton MRN: [id number] DOB: 17-Dec-1950 Today's Date: 08/26/2022  History of Present Illness  Pt is a 71 yo M diagnosed with degenerative arthrosis of the left knee and is s/p elective L TKA.  PMH includes R TKA, HTN, and TIA.   Clinical Impression  Pt was pleasant and motivated to participate during the session and put forth good effort throughout. Pt required no physical assistance during the session but did require extra time and effort and cuing for sequencing to complete tasks safely.  Pt was able to amb around 8 feet before nausea resulting in the need to end the session with nursing notified.  Pt is expected to make very good progress while in acute care and will benefit from OPPT, as set up with surgeon, upon discharge to safely address deficits listed in patient problem list for decreased caregiver assistance and eventual return to PLOF.         Recommendations for follow up therapy are one component of a multi-disciplinary discharge planning process, led by the attending physician.  Recommendations may be updated based on patient status, additional functional criteria and insurance authorization.  Follow Up Recommendations Follow physician's recommendations for discharge plan and follow up therapies      Assistance Recommended at Discharge Intermittent Supervision/Assistance  Patient can return home with the following  A little help with walking and/or transfers;A little help with bathing/dressing/bathroom;Assistance with cooking/housework;Assist for transportation;Help with stairs or ramp for entrance    Equipment Recommendations None recommended by PT  Recommendations for Other Services       Functional Status Assessment Patient has had a recent decline in their functional status and demonstrates the ability to make significant improvements in function in a reasonable and predictable amount of time.     Precautions /  Restrictions Precautions Precautions: Fall Restrictions Weight Bearing Restrictions: Yes LLE Weight Bearing: Weight bearing as tolerated Other Position/Activity Restrictions: Pt able to perform Ind LLE SLR without extensor lag, no KI required      Mobility  Bed Mobility Overal bed mobility: Needs Assistance Bed Mobility: Supine to Sit     Supine to sit: Supervision     General bed mobility comments: extra time and effort as well as use of the bed rail    Transfers Overall transfer level: Needs assistance Equipment used: Rolling walker (2 wheels) Transfers: Sit to/from Stand Sit to Stand: Min guard           General transfer comment: Min verbal and visual cues for general sequencing    Ambulation/Gait Ambulation/Gait assistance: Min guard Gait Distance (Feet): 8 Feet Assistive device: Rollator (4 wheels) Gait Pattern/deviations: Step-to pattern, Decreased stance time - left Gait velocity: decreased     General Gait Details: Pt steady with steps at EOB and from bed to chair with distance limited by onset of nausea; no buckling noted  Stairs            Wheelchair Mobility    Modified Rankin (Stroke Patients Only)       Balance Overall balance assessment: Needs assistance Sitting-balance support: Feet unsupported, No upper extremity supported Sitting balance-Leahy Scale: Normal     Standing balance support: Bilateral upper extremity supported, During functional activity Standing balance-Leahy Scale: Good                               Pertinent Vitals/Pain Pain Assessment Pain Assessment: 0-10 Pain Score:  3  Pain Location: L knee Pain Descriptors / Indicators: Aching, Sore Pain Intervention(s): Repositioned, Premedicated before session, Monitored during session, Ice applied    Home Living Family/patient expects to be discharged to:: Private residence Living Arrangements: Spouse/significant other Available Help at Discharge:  Family;Available 24 hours/day Type of Home: House Home Access: Stairs to enter Entrance Stairs-Rails: Left Entrance Stairs-Number of Steps: 4   Home Layout: One level Home Equipment: Conservation officer, nature (2 wheels);Cane - single point;BSC/3in1      Prior Function Prior Level of Function : Independent/Modified Independent             Mobility Comments: Ind amb community distances without an AD, golfing up until a week prior to surgery, no fall history, rare SPC use when needed for pain control ADLs Comments: Ind with ADLs     Hand Dominance   Dominant Hand: Right    Extremity/Trunk Assessment   Upper Extremity Assessment Upper Extremity Assessment: Overall WFL for tasks assessed    Lower Extremity Assessment Lower Extremity Assessment: LLE deficits/detail LLE Deficits / Details: BLE ankle strength, AROM, and sensation to light touch grossly intact; LLE hip flexion strength >/= 3/5 LLE: Unable to fully assess due to pain LLE Sensation: WNL       Communication   Communication: No difficulties  Cognition Arousal/Alertness: Awake/alert Behavior During Therapy: WFL for tasks assessed/performed Overall Cognitive Status: Within Functional Limits for tasks assessed                                          General Comments      Exercises Total Joint Exercises Ankle Circles/Pumps: AROM, Strengthening, Both, 10 reps Quad Sets: AROM, Strengthening, Left, 5 reps, 10 reps Straight Leg Raises: AROM, Strengthening, Left, 5 reps Long Arc Quad: AROM, Strengthening, Left, 5 reps, 10 reps Knee Flexion: AROM, Strengthening, Left, 5 reps, 10 reps Goniometric ROM: L knee AROM: 2-83 deg Marching in Standing: AROM, Strengthening, Both, 5 reps, Standing Other Exercises Other Exercises: HEP education per handout Other Exercises: Positioning education to promote L knee ext PROM   Assessment/Plan    PT Assessment Patient needs continued PT services  PT Problem List  Decreased strength;Decreased range of motion;Decreased activity tolerance;Decreased balance;Decreased mobility;Decreased knowledge of use of DME;Pain       PT Treatment Interventions DME instruction;Gait training;Stair training;Functional mobility training;Therapeutic activities;Patient/family education;Therapeutic exercise;Balance training    PT Goals (Current goals can be found in the Care Plan section)  Acute Rehab PT Goals Patient Stated Goal: To play golf and garden PT Goal Formulation: With patient Time For Goal Achievement: 09/08/22 Potential to Achieve Goals: Good    Frequency BID     Co-evaluation               AM-PAC PT "6 Clicks" Mobility  Outcome Measure Help needed turning from your back to your side while in a flat bed without using bedrails?: A Little Help needed moving from lying on your back to sitting on the side of a flat bed without using bedrails?: A Little Help needed moving to and from a bed to a chair (including a wheelchair)?: A Little Help needed standing up from a chair using your arms (e.g., wheelchair or bedside chair)?: A Little Help needed to walk in hospital room?: A Little Help needed climbing 3-5 steps with a railing? : A Little 6 Click Score: 18    End  of Session Equipment Utilized During Treatment: Gait belt Activity Tolerance: Other (comment) (limited by nausea) Patient left: in chair;with call bell/phone within reach;with chair alarm set;with SCD's reapplied;Other (comment);with family/visitor present (Polar care donned to L knee) Nurse Communication: Mobility status;Weight bearing status;Other (comment) (Pt with nausea, requested meds) PT Visit Diagnosis: Other abnormalities of gait and mobility (R26.89);Muscle weakness (generalized) (M62.81);Pain Pain - Right/Left: Left Pain - part of body: Knee    Time: 1536-1616 PT Time Calculation (min) (ACUTE ONLY): 40 min   Charges:   PT Evaluation $PT Eval Moderate Complexity: 1 Mod PT  Treatments $Therapeutic Exercise: 8-22 mins $Therapeutic Activity: 8-22 mins       D. Royetta Asal PT, DPT 08/26/22, 4:37 PM

## 2022-08-26 NOTE — TOC Progression Note (Signed)
Transition of Care [facility]) - Progression Note    Patient Details  Name: James Compton MRN: [id number] Date of Birth: 12/10/1950  Transition of Care [facility]) CM/SW [place], RN Phone Number: 08/26/2022, 2:36 PM  Clinical Narrative:    The patient is open with [facility] for [facility] PT prior to surgery by surgeons office         Expected Discharge Plan and Services                                                 Social Determinants of Health (SDOH) Interventions    Readmission Risk Interventions     No data to display

## 2022-08-26 NOTE — Plan of Care (Signed)
  Problem: Education: Goal: Knowledge of the prescribed therapeutic regimen will improve Outcome: Progressing Goal: Individualized Educational Video(s) Outcome: Progressing   Problem: Activity: Goal: Ability to avoid complications of mobility impairment will improve Outcome: Progressing Goal: Range of joint motion will improve Outcome: Progressing   Problem: Clinical Measurements: Goal: Postoperative complications will be avoided or minimized Outcome: Progressing   Problem: Pain Management: Goal: Pain level will decrease with appropriate interventions Outcome: Progressing   Problem: Skin Integrity: Goal: Will show signs of wound healing Outcome: Progressing   Problem: Education: Goal: Knowledge of General Education information will improve Description: Including pain rating scale, medication(s)/side effects and non-pharmacologic comfort measures Outcome: Progressing   Problem: Health Behavior/Discharge Planning: Goal: Ability to manage health-related needs will improve Outcome: Progressing   Problem: Clinical Measurements: Goal: Ability to maintain clinical measurements within normal limits will improve Outcome: Progressing Goal: Will remain free from infection Outcome: Progressing Goal: Diagnostic test results will improve Outcome: Progressing Goal: Respiratory complications will improve Outcome: Progressing Goal: Cardiovascular complication will be avoided Outcome: Progressing   Problem: Activity: Goal: Risk for activity intolerance will decrease Outcome: Progressing   Problem: Nutrition: Goal: Adequate nutrition will be maintained Outcome: Progressing   Problem: Elimination: Goal: Will not experience complications related to bowel motility Outcome: Progressing Goal: Will not experience complications related to urinary retention Outcome: Progressing   Problem: Pain Managment: Goal: General experience of comfort will improve Outcome: Progressing    Problem: Safety: Goal: Ability to remain free from injury will improve Outcome: Progressing   Problem: Skin Integrity: Goal: Risk for impaired skin integrity will decrease Outcome: Progressing   Problem: Coping: Goal: Level of anxiety will decrease Outcome: Completed/Met

## 2022-08-26 NOTE — Progress Notes (Signed)
Spoke with the patient and discussed plans and needs Reviewed the MOON He has a rolling walker at home and access to a 3 in 1, he stated that he does not need any DME He has an outpatient PT appointment on Thursday His wife will provide transportation

## 2022-08-26 NOTE — Anesthesia Procedure Notes (Signed)
Spinal  Patient location during procedure: OR Start time: 08/26/2022 7:17 AM End time: 08/26/2022 7:20 AM Reason for block: surgical anesthesia Staffing Performed: resident/CRNA  Anesthesiologist: Dimas Millin, MD Resident/CRNA: Hedda Slade, CRNA Performed by: Hedda Slade, CRNA Authorized by: Dimas Millin, MD   Preanesthetic Checklist Completed: patient identified, IV checked, site marked, risks and benefits discussed, surgical consent, monitors and equipment checked, pre-op evaluation and timeout performed Spinal Block Patient position: sitting Prep: ChloraPrep Patient monitoring: heart rate, continuous pulse ox, blood pressure and cardiac monitor Approach: midline Location: L3-4 Injection technique: single-shot Needle Needle type: Whitacre and Introducer  Needle gauge: 24 G Needle length: 9 cm Assessment Events: CSF return Additional Notes Sterile aseptic technique used throughout the procedure.  Negative paresthesia. Negative blood return. Positive free-flowing CSF. Expiration date of kit checked and confirmed. Patient tolerated procedure well, without complications.

## 2022-08-26 NOTE — Care Management Obs Status (Signed)
[facility] NOTIFICATION   Patient Details  Name: James Compton MRN: [id number] Date of Birth: Jun 30, 1951   Medicare Observation Status Notification Given:  Yes    Conception Oms, RN 08/26/2022, 2:39 PM

## 2022-08-26 NOTE — Interval H&P Note (Signed)
History and Physical Interval Note:  08/26/2022 6:20 AM  James Compton  has presented today for surgery, with the diagnosis of PRIMARY OSTEOARTHRITIS OF LEFT KNEE.  The various methods of treatment have been discussed with the patient and family. After consideration of risks, benefits and other options for treatment, the patient has consented to  Procedure(s): COMPUTER ASSISTED TOTAL KNEE ARTHROPLASTY (Left) as a surgical intervention.  The patient's history has been reviewed, patient examined, no change in status, stable for surgery.  I have reviewed the patient's chart and labs.  Questions were answered to the patient's satisfaction.     [place]

## 2022-08-26 NOTE — Transfer of Care (Signed)
Immediate Anesthesia Transfer of Care Note  Patient: James Compton  Procedure(s) Performed: COMPUTER ASSISTED TOTAL KNEE ARTHROPLASTY (Left: Knee)  Patient Location: PACU  Anesthesia Type:Spinal  Level of Consciousness: awake, alert  and oriented  Airway & Oxygen Therapy: Patient Spontanous Breathing  Post-op Assessment: Report given to RN and Post -op Vital signs reviewed and stable  Post vital signs: Reviewed and stable  Last Vitals:  Vitals Value Taken Time  BP 112/80 08/26/22 1023  Temp 36.2 C 08/26/22 1023  Pulse 66 08/26/22 1028  Resp 14 08/26/22 1028  SpO2 99 % 08/26/22 1028  Vitals shown include unvalidated device data.  Last Pain:  Vitals:   08/26/22 1023  TempSrc:   PainSc: Asleep         Complications: No notable events documented.

## 2022-08-26 NOTE — Op Note (Signed)
OPERATIVE NOTE  DATE OF SURGERY:  08/26/2022  PATIENT NAME:  James Compton   DOB: February 23, 1951  MRN: [id number]  PRE-OPERATIVE DIAGNOSIS: Degenerative arthrosis of the left knee, primary  POST-OPERATIVE DIAGNOSIS:  Same  PROCEDURE:  Left total knee arthroplasty using computer-assisted navigation  SURGEON:  Marciano Sequin. M.D.  ASSISTANT: Cassell Smiles, PA-C (present and scrubbed throughout the case, critical for assistance with exposure, retraction, instrumentation, and closure)  ANESTHESIA: spinal  ESTIMATED BLOOD LOSS: 50 mL  FLUIDS REPLACED: 800 mL of crystalloid  TOURNIQUET TIME: 73 minutes  DRAINS: 2 medium Hemovac drains  SOFT TISSUE RELEASES: Anterior cruciate ligament, posterior cruciate ligament, deep medial collateral ligament, patellofemoral ligament  IMPLANTS UTILIZED: DePuy Attune size 7 posterior stabilized femoral component (cemented), size 6 rotating platform tibial component (cemented), 41 mm medialized dome patella (cemented), and a 5 mm stabilized rotating platform polyethylene insert.  INDICATIONS FOR SURGERY: James Compton is a 71 y.o. year old male with a long history of progressive knee pain. X-rays demonstrated severe degenerative changes in tricompartmental fashion. The patient had not seen any significant improvement despite conservative nonsurgical intervention. After discussion of the risks and benefits of surgical intervention, the patient expressed understanding of the risks benefits and agree with plans for total knee arthroplasty.   The risks, benefits, and alternatives were discussed at length including but not limited to the risks of infection, bleeding, nerve injury, stiffness, blood clots, the need for revision surgery, cardiopulmonary complications, among others, and they were willing to proceed.  PROCEDURE IN DETAIL: The patient was brought into the operating room and, after adequate spinal anesthesia was achieved, a tourniquet was placed on the  patient's upper thigh. The patient's knee and leg were cleaned and prepped with alcohol and DuraPrep and draped in the usual sterile fashion. A "timeout" was performed as per usual protocol. The lower extremity was exsanguinated using an Esmarch, and the tourniquet was inflated to 300 mmHg. An anterior longitudinal incision was made followed by a standard mid vastus approach. The deep fibers of the medial collateral ligament were elevated in a subperiosteal fashion off of the medial flare of the tibia so as to maintain a continuous soft tissue sleeve. The patella was subluxed laterally and the patellofemoral ligament was incised. Inspection of the knee demonstrated severe degenerative changes with full-thickness loss of articular cartilage. Osteophytes were debrided using a rongeur. Anterior and posterior cruciate ligaments were excised. Two 4.0 mm Schanz pins were inserted in the femur and into the tibia for attachment of the array of trackers used for computer-assisted navigation. Hip center was identified using a circumduction technique. Distal landmarks were mapped using the computer. The distal femur and proximal tibia were mapped using the computer. The distal femoral cutting guide was positioned using computer-assisted navigation so as to achieve a 5 distal valgus cut. The femur was sized and it was felt that a size 7 femoral component was appropriate. A size 7 femoral cutting guide was positioned and the anterior cut was performed and verified using the computer. This was followed by completion of the posterior and chamfer cuts. Femoral cutting guide for the central box was then positioned in the center box cut was performed.  Attention was then directed to the proximal tibia. Medial and lateral menisci were excised. The extramedullary tibial cutting guide was positioned using computer-assisted navigation so as to achieve a 0 varus-valgus alignment and 3 posterior slope. The cut was performed and  verified using the computer. The proximal tibia was  sized and it was felt that a size 6 tibial tray was appropriate. Tibial and femoral trials were inserted followed by insertion of a 5 mm polyethylene insert. This allowed for excellent mediolateral soft tissue balancing both in flexion and in full extension. Finally, the patella was cut and prepared so as to accommodate a 41 mm medialized dome patella. A patella trial was placed and the knee was placed through a range of motion with excellent patellar tracking appreciated. The femoral trial was removed after debridement of posterior osteophytes. The central post-hole for the tibial component was reamed followed by insertion of a keel punch. Tibial trials were then removed. Cut surfaces of bone were irrigated with copious amounts of normal saline using pulsatile lavage and then suctioned dry. Polymethylmethacrylate cement with gentamicin was prepared in the usual fashion using a vacuum mixer. Cement was applied to the cut surface of the proximal tibia as well as along the undersurface of a size 6 rotating platform tibial component. Tibial component was positioned and impacted into place. Excess cement was removed using [employer] fast streamer. Cement was then applied to the cut surfaces of the femur as well as along the posterior flanges of the size 7 femoral component. The femoral component was positioned and impacted into place. Excess cement was removed using [employer] fast streamer. A 5 mm polyethylene trial was inserted and the knee was brought into full extension with steady axial compression applied. Finally, cement was applied to the backside of a 41 mm medialized dome patella and the patellar component was positioned and patellar clamp applied. Excess cement was removed using [employer] fast streamer. After adequate curing of the cement, the tourniquet was deflated after a total tourniquet time of 73 minutes. Hemostasis was achieved using electrocautery. The knee was irrigated with  copious amounts of normal saline using pulsatile lavage followed by 450 ml of Surgiphor and then suctioned dry. 20 mL of 1.3% Exparel and 60 mL of 0.25% Marcaine in 40 mL of normal saline was injected along the posterior capsule, medial and lateral gutters, and along the arthrotomy site. A 5 mm stabilized rotating platform polyethylene insert was inserted and the knee was placed through a range of motion with excellent mediolateral soft tissue balancing appreciated and excellent patellar tracking noted. 2 medium drains were placed in the wound bed and brought out through separate stab incisions. The medial parapatellar portion of the incision was reapproximated using interrupted sutures of #1 Vicryl. Subcutaneous tissue was approximated in layers using first #0 Vicryl followed #2-0 Vicryl. The skin was approximated with skin staples. A sterile dressing was applied.  The patient tolerated the procedure well and was transported to the recovery room in stable condition.    Adriana Lina P. Holley Bouche., M.D.

## 2022-08-27 ENCOUNTER — Other Ambulatory Visit: Payer: Self-pay | Admitting: Family Medicine

## 2022-08-27 DIAGNOSIS — M1712 Unilateral primary osteoarthritis, left knee: Secondary | ICD-10-CM | POA: Diagnosis not present

## 2022-08-27 DIAGNOSIS — I10 Essential (primary) hypertension: Secondary | ICD-10-CM

## 2022-08-27 MED ORDER — CELECOXIB 200 MG PO CAPS: 200.0000 mg | ORAL_CAPSULE | Freq: Two times a day (BID) | ORAL | 0 refills | Status: DC

## 2022-08-27 MED ORDER — OXYCODONE HCL 5 MG PO TABS: 5.0000 mg | ORAL_TABLET | ORAL | 0 refills | Status: DC | PRN

## 2022-08-27 MED ORDER — TRAMADOL HCL 50 MG PO TABS: 50.0000 mg | ORAL_TABLET | ORAL | 0 refills | Status: DC | PRN

## 2022-08-27 MED ORDER — ENOXAPARIN SODIUM 40 MG/0.4ML IJ SOSY: 40.0000 mg | PREFILLED_SYRINGE | INTRAMUSCULAR | 0 refills | Status: DC

## 2022-08-27 MED ORDER — ONDANSETRON HCL 4 MG PO TABS: 4.0000 mg | ORAL_TABLET | Freq: Four times a day (QID) | ORAL | 0 refills | Status: DC | PRN

## 2022-08-27 NOTE — Evaluation (Signed)
Occupational Therapy Evaluation Patient Details Name: James Compton MRN: [id number] DOB: 12-10-1950 Today's Date: 08/27/2022   History of Present Illness Pt is a 71 yo M diagnosed with degenerative arthrosis of the left knee and is s/p elective L TKA.  PMH includes R TKA, HTN, and TIA.   Clinical Impression   James Compton was seen for OT evaluation this date. Prior to hospital admission, pt was MOD I for mobility and ADLs. Pt lives with spouse. Pt presents to acute OT demonstrating impaired ADL performance and functional mobility 2/2 decreased activity tolerance. Pt currently requires SUPERVISION + RW for ADL t/f - cues for hand placement. MOD I don/doff B socks seated EOB. Good static standing balance no UE support. Reviewed polar care and falls prevention. All education complete, will sign off. Upon hospital discharge, recommend no OT follow up.      Recommendations for follow up therapy are one component of a multi-disciplinary discharge planning process, led by the attending physician.  Recommendations may be updated based on patient status, additional functional criteria and insurance authorization.   Follow Up Recommendations  No OT follow up    Assistance Recommended at Discharge PRN  Patient can return home with the following A little help with walking and/or transfers;Help with stairs or ramp for entrance    Functional Status Assessment  Patient has had a recent decline in their functional status and demonstrates the ability to make significant improvements in function in a reasonable and predictable amount of time.  Equipment Recommendations  None recommended by OT    Recommendations for Other Services       Precautions / Restrictions Precautions Precautions: Fall Restrictions Weight Bearing Restrictions: Yes LLE Weight Bearing: Weight bearing as tolerated      Mobility Bed Mobility Overal bed mobility: Modified Independent                  Transfers Overall  transfer level: Needs assistance Equipment used: Rolling walker (2 wheels) Transfers: Sit to/from Stand Sit to Stand: Supervision                  Balance Overall balance assessment: Needs assistance Sitting-balance support: Feet unsupported, No upper extremity supported Sitting balance-Leahy Scale: Normal     Standing balance support: No upper extremity supported, During functional activity Standing balance-Leahy Scale: Good                             ADL either performed or assessed with clinical judgement   ADL Overall ADL's : Needs assistance/impaired                                       General ADL Comments: SUPERVISION + RW for ADL t/f - cues for hand placement. MOD I don/doff B socks seated EOB.      Pertinent Vitals/Pain Pain Assessment Pain Assessment: 0-10 Pain Score: 1  Pain Location: L knee Pain Descriptors / Indicators: Aching, Sore Pain Intervention(s): Limited activity within patient's tolerance     Hand Dominance Right   Extremity/Trunk Assessment Upper Extremity Assessment Upper Extremity Assessment: Overall WFL for tasks assessed   Lower Extremity Assessment Lower Extremity Assessment: Overall WFL for tasks assessed       Communication Communication Communication: No difficulties   Cognition Arousal/Alertness: Awake/alert Behavior During Therapy: WFL for tasks assessed/performed Overall Cognitive Status: Within Functional  Limits for tasks assessed                                                  Home Living Family/patient expects to be discharged to:: Private residence Living Arrangements: Spouse/significant other Available Help at Discharge: Family;Available 24 hours/day Type of Home: House Home Access: Stairs to enter [employer] of Steps: 4 Entrance Stairs-Rails: Left Home Layout: One level     Bathroom Shower/Tub: Teacher, early years/pre: Standard      Home Equipment: Conservation officer, nature (2 wheels);Cane - single point;BSC/3in1          Prior Functioning/Environment Prior Level of Function : Independent/Modified Independent             Mobility Comments: Ind amb community distances without an AD, golfing up until a week prior to surgery, no fall history, rare SPC use when needed for pain control          OT Problem List: Decreased strength;Decreased range of motion;Decreased activity tolerance;Impaired balance (sitting and/or standing);Decreased safety awareness         OT Goals(Current goals can be found in the care plan section) Acute Rehab OT Goals Patient Stated Goal: to go home OT Goal Formulation: With patient Time For Goal Achievement: 09/10/22 Potential to Achieve Goals: Good   AM-PAC OT "6 Clicks" Daily Activity     Outcome Measure Help from another person eating meals?: None Help from another person taking care of personal grooming?: None Help from another person toileting, which includes using toliet, bedpan, or urinal?: None Help from another person bathing (including washing, rinsing, drying)?: A Little Help from another person to put on and taking off regular upper body clothing?: None Help from another person to put on and taking off regular lower body clothing?: None 6 Click Score: 23   End of Session    Activity Tolerance: Patient tolerated treatment well Patient left: in chair;with call bell/phone within reach  OT Visit Diagnosis: Other abnormalities of gait and mobility (R26.89)                Time: 4332-9518 OT Time Calculation (min): 11 min Charges:  OT General Charges $OT Visit: 1 Visit OT Evaluation $OT Eval Low Complexity: 1 Low  James Compton, M.S. OTR/L  08/27/22, 9:38 AM  ascom [phone]

## 2022-08-27 NOTE — Anesthesia Postprocedure Evaluation (Signed)
Anesthesia Post Note  Patient: James Compton  Procedure(s) Performed: COMPUTER ASSISTED TOTAL KNEE ARTHROPLASTY (Left: Knee)  Patient location during evaluation: Nursing Unit Anesthesia Type: Spinal Level of consciousness: oriented and awake and alert Pain management: pain level controlled Vital Signs Assessment: post-procedure vital signs reviewed and stable Respiratory status: spontaneous breathing and respiratory function stable Cardiovascular status: blood pressure returned to baseline and stable Postop Assessment: no headache, no backache, no apparent nausea or vomiting and patient able to bend at knees Anesthetic complications: no   No notable events documented.   Last Vitals:  Vitals:   08/27/22 0409 08/27/22 0804  BP: 116/65 139/80  Pulse: 70 71  Resp: 17 17  Temp: 36.7 C 36.5 C  SpO2: 96% 100%    Last Pain:  Vitals:   08/27/22 0621  TempSrc:   PainSc: 7                  James Compton

## 2022-08-27 NOTE — Progress Notes (Signed)
Physical Therapy Treatment Patient Details Name: James Compton MRN: [id number] DOB: 06-Feb-1951 Today's Date: 08/27/2022   History of Present Illness Pt is a 71 yo M diagnosed with degenerative arthrosis of the left knee and is s/p elective L TKA.  PMH includes R TKA, HTN, and TIA.    PT Comments    Pt was pleasant and motivated to participate during the session and put forth good effort throughout. Pt required no physical assistance with any functional task and presented with good control and stability throughout.  Pt reported having had some nausea earlier in the day but reported no adverse symptoms during the session other than very mild L knee pain.  Pt's SpO2 and HR WNL or room air throughout.  Pt will benefit from OPPT, as already scheduled by surgeon, upon discharge to safely address deficits listed in patient problem list for decreased caregiver assistance and eventual return to PLOF.     Recommendations for follow up therapy are one component of a multi-disciplinary discharge planning process, led by the attending physician.  Recommendations may be updated based on patient status, additional functional criteria and insurance authorization.  Follow Up Recommendations  Follow physician's recommendations for discharge plan and follow up therapies     Assistance Recommended at Discharge Intermittent Supervision/Assistance  Patient can return home with the following A little help with walking and/or transfers;A little help with bathing/dressing/bathroom;Assistance with cooking/housework;Assist for transportation;Help with stairs or ramp for entrance   Equipment Recommendations  None recommended by PT    Recommendations for Other Services       Precautions / Restrictions Precautions Precautions: Fall Restrictions Weight Bearing Restrictions: Yes LLE Weight Bearing: Weight bearing as tolerated Other Position/Activity Restrictions: Pt able to perform Ind LLE SLR without extensor lag,  no KI required     Mobility  Bed Mobility               General bed mobility comments: NT, pt in recliner    Transfers Overall transfer level: Needs assistance Equipment used: Rolling walker (2 wheels) Transfers: Sit to/from Stand Sit to Stand: Supervision           General transfer comment: Min verbal and visual cues for general sequencing; very good control and stability from various height surfaces    Ambulation/Gait Ambulation/Gait assistance: Supervision Gait Distance (Feet): 200 Feet Assistive device: Rolling walker (2 wheels) Gait Pattern/deviations: Step-through pattern, Decreased step length - right, Decreased step length - left Gait velocity: decreased     General Gait Details: Only mildly decreased gait speed with equal stance time L vs R with step-through pattern; no instability or buckling noted   Stairs Stairs: Yes Stairs assistance: Min guard Stair Management: One rail Left, Step to pattern, Sideways Number of Stairs: 4 General stair comments: min verbal and visual cues for sequencing with excellent carryover and very good stability and control   Wheelchair Mobility    Modified Rankin (Stroke Patients Only)       Balance Overall balance assessment: Needs assistance Sitting-balance support: Feet unsupported, No upper extremity supported Sitting balance-Leahy Scale: Normal     Standing balance support: Bilateral upper extremity supported, During functional activity Standing balance-Leahy Scale: Good                              Cognition Arousal/Alertness: Awake/alert Behavior During Therapy: WFL for tasks assessed/performed Overall Cognitive Status: Within Functional Limits for tasks assessed  Exercises Total Joint Exercises Quad Sets: AROM, Strengthening, Left, 5 reps, 10 reps Long Arc Quad: AROM, Strengthening, Left, 5 reps, 10 reps Knee Flexion: AROM,  Strengthening, Left, 5 reps, 10 reps Goniometric ROM: L knee AROM: 4-87 deg Other Exercises Other Exercises: Car transfer sequencing education provided    General Comments        Pertinent Vitals/Pain Pain Assessment Pain Assessment: 0-10 Pain Score: 1  Pain Location: L knee Pain Descriptors / Indicators: Sore Pain Intervention(s): Repositioned, Premedicated before session, Ice applied, Monitored during session    Home Living Family/patient expects to be discharged to:: Private residence Living Arrangements: Spouse/significant other Available Help at Discharge: Family;Available 24 hours/day Type of Home: House Home Access: Stairs to enter Entrance Stairs-Rails: Left Entrance Stairs-Number of Steps: 4   Home Layout: One level Home Equipment: Conservation officer, nature (2 wheels);Cane - single point;BSC/3in1      Prior Function            PT Goals (current goals can now be found in the care plan section) Progress towards PT goals: Progressing toward goals    Frequency    BID      PT Plan Current plan remains appropriate    Co-evaluation              AM-PAC PT "6 Clicks" Mobility   Outcome Measure  Help needed turning from your back to your side while in a flat bed without using bedrails?: A Little Help needed moving from lying on your back to sitting on the side of a flat bed without using bedrails?: A Little Help needed moving to and from a bed to a chair (including a wheelchair)?: A Little Help needed standing up from a chair using your arms (e.g., wheelchair or bedside chair)?: A Little Help needed to walk in hospital room?: A Little Help needed climbing 3-5 steps with a railing? : A Little 6 Click Score: 18    End of Session Equipment Utilized During Treatment: Gait belt Activity Tolerance: Patient tolerated treatment well Patient left: in chair;with call bell/phone within reach;with chair alarm set;with SCD's reapplied;Other (comment) (polar care donned to  L knee) Nurse Communication: Mobility status;Weight bearing status;Other (comment) PT Visit Diagnosis: Other abnormalities of gait and mobility (R26.89);Muscle weakness (generalized) (M62.81);Pain Pain - Right/Left: Left Pain - part of body: Knee     Time: 0933-1000 PT Time Calculation (min) (ACUTE ONLY): 27 min  Charges:  $Gait Training: 23-37 mins                     D. Scott Semone Orlov PT, DPT 08/27/22, 12:24 PM

## 2022-08-27 NOTE — Progress Notes (Signed)
Physical Therapy Treatment Patient Details Name: James Compton MRN: [id number] DOB: November 15, 1951 Today's Date: 08/27/2022   History of Present Illness Pt is a 71 yo M diagnosed with degenerative arthrosis of the left knee and is s/p elective L TKA.  PMH includes R TKA, HTN, and TIA.    PT Comments    Cleared for continued participation with session by physician.  Patient continues to endorse very minimal pain at rest and with exertion.  Completes all mobility with sup/mod indep, and demonstrates progressive increase in gait distance this session.  Good comfort/confidence with mobility tasks and good stance time/loading to L LE, but does maintain decreased cadence/gait speed for overall safety purposes (10' walk time, 10 seconds) Minimal change in drain output noted with gait efforts; repetitive therex/ROM deferred this PM.    Recommendations for follow up therapy are one component of a multi-disciplinary discharge planning process, led by the attending physician.  Recommendations may be updated based on patient status, additional functional criteria and insurance authorization.  Follow Up Recommendations  Follow physician's recommendations for discharge plan and follow up therapies     Assistance Recommended at Discharge Set up Supervision/Assistance  Patient can return home with the following A little help with walking and/or transfers;A little help with bathing/dressing/bathroom;Assistance with cooking/housework;Assist for transportation;Help with stairs or ramp for entrance   Equipment Recommendations  None recommended by PT    Recommendations for Other Services       Precautions / Restrictions Precautions Precautions: Fall Restrictions Weight Bearing Restrictions: Yes LLE Weight Bearing: Weight bearing as tolerated     Mobility  Bed Mobility Overal bed mobility: Independent                  Transfers Overall transfer level: Modified independent Equipment used:  Rolling walker (2 wheels) Transfers: Sit to/from Stand Sit to Stand: Modified independent (Device/Increase time)           General transfer comment: good hand placement, good active use/WBing L LE with movement transition    Ambulation/Gait Ambulation/Gait assistance: Supervision, Modified independent (Device/Increase time) Gait Distance (Feet): 400 Feet Assistive device: Rolling walker (2 wheels)   Gait velocity: 10' walk time, 10 seconds Gait velocity interpretation: <1.31 ft/sec, indicative of household ambulator   General Gait Details: reciprocal stepping pattern with good step height/length, good stance time/loading L LE; steady cadence with no buckling, LOB.  Mild decrease in functional L knee flexion throughout gait cycle.  No safety concerns appreciated.   Stairs             Wheelchair Mobility    Modified Rankin (Stroke Patients Only)       Balance Overall balance assessment: Needs assistance Sitting-balance support: No upper extremity supported, Feet supported Sitting balance-Leahy Scale: Good     Standing balance support: Bilateral upper extremity supported Standing balance-Leahy Scale: Good                              Cognition Arousal/Alertness: Awake/alert Behavior During Therapy: WFL for tasks assessed/performed Overall Cognitive Status: Within Functional Limits for tasks assessed                                          Exercises Other Exercises Other Exercises: Toilet transfer, ambulatory with RW, sup/mod indep; patient expressing desire to 'sit' for extended time, expecting BM.  Reinforced need to use call bell once complete; patient voiced understanding.  CNA informed/aware of patient location and status; to assist once complete.    General Comments        Pertinent Vitals/Pain Pain Assessment Pain Assessment: 0-10 Pain Score: 1  Pain Location: L knee Pain Descriptors / Indicators: Sore Pain  Intervention(s): Limited activity within patient's tolerance, Monitored during session, Repositioned    Home Living                          Prior Function            PT Goals (current goals can now be found in the care plan section) Acute Rehab PT Goals Patient Stated Goal: To play golf and garden PT Goal Formulation: With patient Time For Goal Achievement: 09/08/22 Potential to Achieve Goals: Good Progress towards PT goals: Progressing toward goals    Frequency           PT Plan Current plan remains appropriate    Co-evaluation              AM-PAC PT "6 Clicks" Mobility   Outcome Measure  Help needed turning from your back to your side while in a flat bed without using bedrails?: None Help needed moving from lying on your back to sitting on the side of a flat bed without using bedrails?: None Help needed moving to and from a bed to a chair (including a wheelchair)?: None Help needed standing up from a chair using your arms (e.g., wheelchair or bedside chair)?: None Help needed to walk in hospital room?: None Help needed climbing 3-5 steps with a railing? : A Little 6 Click Score: 23    End of Session Equipment Utilized During Treatment: Gait belt Activity Tolerance: Patient tolerated treatment well Patient left:  (in bathroom; CNA informed/aware of status and location.  Patient to pull call bell once complete; family at bedside.)   PT Visit Diagnosis: Other abnormalities of gait and mobility (R26.89);Muscle weakness (generalized) (M62.81);Pain Pain - Right/Left: Left Pain - part of body: Knee     Time: 8938-1017 PT Time Calculation (min) (ACUTE ONLY): 9 min  Charges:  $Gait Training: 8-22 mins                     Demitri Kucinski H. Owens Shark, PT, DPT, NCS 08/27/22, 4:52 PM [phone]

## 2022-08-27 NOTE — Progress Notes (Signed)
D/C order received, Hemavac taken out per order and IV also removed.  AVS to be printed and explained to patient.  Will send home extra honeycomb dressings.

## 2022-08-27 NOTE — Progress Notes (Addendum)
  Subjective: 1 Day Post-Op Procedure(s) (LRB): COMPUTER ASSISTED TOTAL KNEE ARTHROPLASTY (Left) Patient reports pain as well-controlled.   Patient is well, but has had some minor complaints of nausea withut vomiting Plan is to go Home after hospital stay. Negative for chest pain and shortness of breath Fever: no Patient has not had a bowel movement.  Objective: Vital signs in last 24 hours: Temp:  [97 F (36.1 C)-98 F (36.7 C)] 97.7 F (36.5 C) (10/03 0804) Pulse Rate:  [64-78] 71 (10/03 0804) Resp:  [11-20] 17 (10/03 0804) BP: (112-139)/(63-86) 139/80 (10/03 0804) SpO2:  [92 %-100 %] 100 % (10/03 0804)  Intake/Output from previous day:  Intake/Output Summary (Last 24 hours) at 08/27/2022 1017 Last data filed at 08/27/2022 1012 Gross per 24 hour  Intake 190 ml  Output 1370 ml  Net -1180 ml    Intake/Output this shift: Total I/O In: -  Out: 150 [Drains:150]  Labs: Recent Labs    08/26/22 0623  HGB 14.3   Recent Labs    08/26/22 0623  HCT 42.0   Recent Labs    08/26/22 0623  NA 142  K 3.4*  CL 103  BUN 14  CREATININE 0.80  GLUCOSE 100*   No results for input(s): "LABPT", "INR" in the last 72 hours.   EXAM General - Patient is Alert, Appropriate, and Oriented Extremity - Neurovascular intact Dorsiflexion/Plantar flexion intact Compartment soft Dressing/Incision -Postoperative dressing remains in place., Polar Care in place and working. , Hemovac in place.  Motor Function - intact, moving foot and toes well on exam. Able to perform independent SLR.  Cardiovascular- Regular rate and rhythm, no murmurs/rubs/gallops Respiratory- Lungs clear to auscultation bilaterally Gastrointestinal- soft, nontender, and active bowel sounds   Assessment/Plan: 1 Day Post-Op Procedure(s) (LRB): COMPUTER ASSISTED TOTAL KNEE ARTHROPLASTY (Left) Principal Problem:   Total knee replacement status  Estimated body mass index is 29.55 kg/m as calculated from the  following:   Height as of this encounter: 5' 9.5" (1.765 m).   Weight as of this encounter: 92.1 kg. Advance diet Up with therapy  Possible d/c later today pending completion of therapy goals.      DVT Prophylaxis - Lovenox, Ted hose, and SCDs Weight-Bearing as tolerated to left leg  Cassell Smiles, PA-C [facility] Orthopaedic Surgery 08/27/2022, 10:17 AM

## 2022-08-27 NOTE — Discharge Summary (Signed)
Physician Discharge Summary  Patient ID: James Compton MRN: [id number] DOB/AGE: 73-Jun-1952 73 y.o.  Admit date: 08/26/2022 Discharge date: 08/27/2022  Admission Diagnoses:  Total knee replacement status [Z96.659]  Surgeries:Procedure(s): Left total knee arthroplasty using computer-assisted navigation   SURGEON:  Marciano Sequin. M.D.   ASSISTANT: Cassell Smiles, PA-C (present and scrubbed throughout the case, critical for assistance with exposure, retraction, instrumentation, and closure)   ANESTHESIA: spinal   ESTIMATED BLOOD LOSS: 50 mL   FLUIDS REPLACED: 800 mL of crystalloid   TOURNIQUET TIME: 73 minutes   DRAINS: 2 medium Hemovac drains   SOFT TISSUE RELEASES: Anterior cruciate ligament, posterior cruciate ligament, deep medial collateral ligament, patellofemoral ligament   IMPLANTS UTILIZED: DePuy Attune size 7 posterior stabilized femoral component (cemented), size 6 rotating platform tibial component (cemented), 41 mm medialized dome patella (cemented), and a 5 mm stabilized rotating platform polyethylene insert.  Discharge Diagnoses: Patient Active Problem List   Diagnosis Date Noted   Primary osteoarthritis of left knee 07/07/2022   Chronic pain of left knee 04/19/2022   Total knee replacement status 11/30/2018   Plantar fasciitis, right 07/02/2018   OSA (obstructive sleep apnea) 12/09/2017   Essential hypertension 11/04/2016   Hyperlipidemia 11/04/2016   Obesity 11/04/2016   History of TIA (transient ischemic attack) 11/03/2016   Aortic ectasia, abdominal ([place]) 10/21/2016    Past Medical History:  Diagnosis Date   Actinic keratosis    Arthritis    Osteo - knees   Complication of anesthesia    bowels "feel asleep" after knee surgery   GERD (gastroesophageal reflux disease)    occas   Hypertension    Pneumonia    PONV (postoperative nausea and vomiting)    Sleep apnea    Status post arthroscopy 04/11/2017   Right Knee May 2018 Dr. Marry Guan   Stroke  [facility])    TIA 2017   TIA (transient ischemic attack) 11/03/2016   Vertigo 01/2011   patient reports he ate lots of candy that day   Wears hearing aid    bilateral   Wears partial dentures    upper     Transfusion:    Consultants (if any):   Discharged Condition: Improved  Hospital Course: James Compton is an 71 y.o. male who was admitted 08/26/2022 with a diagnosis of left knee osteoarthritis and went to the operating room on 08/26/2022 and underwent left total knee arthoplasty. The patient received perioperative antibiotics for prophylaxis (see below). The patient tolerated the procedure well and was transported to PACU in stable condition. After meeting PACU criteria, the patient was subsequently transferred to the Orthopaedics/Rehabilitation unit.   The patient received DVT prophylaxis in the form of early mobilization, Lovenox, TED hose, and SCDs . A sacral pad had been placed and heels were elevated off of the bed with rolled towels in order to protect skin integrity. Foley catheter was discontinued on postoperative day #0. Wound drains were discontinued on postoperative day #1. The surgical incision was healing well without signs of infection.  Physical therapy was initiated postoperatively for transfers, gait training, and strengthening. Occupational therapy was initiated for activities of daily living and evaluation for assisted devices. Rehabilitation goals were reviewed in detail with the patient. The patient made steady progress with physical therapy and physical therapy recommended discharge to Home.   The patient achieved the preliminary goals of this hospitalization and was felt to be medically and orthopaedically appropriate for discharge.  He was given perioperative antibiotics:  Anti-infectives (From  admission, onward)    Start     Dose/Rate Route Frequency Ordered Stop   08/26/22 1330  ceFAZolin (ANCEF) IVPB 2g/100 mL premix        2 g 200 mL/hr over 30 Minutes  Intravenous Every 6 hours 08/26/22 1208 08/26/22 2009   08/26/22 0630  ceFAZolin (ANCEF) 2-4 GM/100ML-% IVPB       Note to Pharmacy: Norton Blizzard A: cabinet override      08/26/22 0630 08/26/22 0742   08/26/22 0600  ceFAZolin (ANCEF) IVPB 2g/100 mL premix        2 g 200 mL/hr over 30 Minutes Intravenous On call to O.R. 08/25/22 2146 08/26/22 0737     .  Recent vital signs:  Vitals:   08/27/22 0804 08/27/22 1624  BP: 139/80 130/74  Pulse: 71 64  Resp: 17 17  Temp: 97.7 F (36.5 C) 98.1 F (36.7 C)  SpO2: 100% 98%    Recent laboratory studies:  Recent Labs    08/26/22 0623  HGB 14.3  HCT 42.0  K 3.4*  CL 103  BUN 14  CREATININE 0.80  GLUCOSE 100*    Diagnostic Studies: DG Knee Left Port  Result Date: 08/26/2022 CLINICAL DATA:  Postop EXAM: PORTABLE LEFT KNEE - 1-2 VIEW COMPARISON:  None Available. FINDINGS: Components of left knee arthroplasty project in expected location. Defects from orthopedic hardware noted in the distal femoral and proximal tibial shafts. Anterior skin staples and surgical drain. IMPRESSION: Left knee arthroplasty without apparent complication. Electronically Signed   By: Lucrezia Europe M.D.   On: 08/26/2022 13:56    Discharge Medications:   Allergies as of 08/27/2022       Reactions   Dipyridamole    Headaches, nausea, upset stomach   Penicillins Nausea Only   IgE = 111 (WNL) on 07/17/2021 Has patient had a PCN reaction causing immediate rash, facial/tongue/throat swelling, SOB or lightheadedness with hypotension: no Has patient had a PCN reaction causing severe rash involving mucus membranes or skin necrosis: no Has patient had a PCN reaction that required hospitalization: no Has patient had a PCN reaction occurring within the last 10 years: no If all of the above answers are "NO", then may proceed with Cephalosporin use.        Medication List     STOP taking these medications    aspirin EC 325 MG tablet   meloxicam 15 MG  tablet Commonly known as: MOBIC       TAKE these medications    acetaminophen 500 MG tablet Commonly known as: TYLENOL Take 1,000 mg by mouth every 8 (eight) hours as needed (pain).   atorvastatin 80 MG tablet Commonly known as: LIPITOR TAKE 1 TABLET BY MOUTH EVERY DAY IN THE EVENING   celecoxib 200 MG capsule Commonly known as: CELEBREX Take 1 capsule (200 mg total) by mouth 2 (two) times daily.   cetirizine 10 MG tablet Commonly known as: ZYRTEC   Coenzyme Q10 200 MG capsule Take 1 capsule by mouth 1 day or 1 dose.   enoxaparin 40 MG/0.4ML injection Commonly known as: LOVENOX Inject 0.4 mLs (40 mg total) into the skin daily for 14 days.   fluticasone 50 MCG/ACT nasal spray Commonly known as: FLONASE Place 2 sprays into both nostrils daily as needed for allergies or rhinitis.   gentamicin ointment 0.1 % Commonly known as: GARAMYCIN Apply 1 application topically 2 (two) times daily. Apply to affected area 3 times a day   ibuprofen 200 MG tablet Commonly  known as: ADVIL Take 400 mg by mouth every 6 (six) hours as needed.   ketoconazole 2 % cream Commonly known as: NIZORAL Apply 1 application topically daily as needed for irritation.   losartan-hydrochlorothiazide 50-12.5 MG tablet Commonly known as: HYZAAR Take 1 tablet by mouth daily.   multivitamin capsule Take 1 capsule by mouth daily. PM   omeprazole 20 MG capsule Commonly known as: PRILOSEC Take 1 capsule (20 mg total) by mouth daily.   ondansetron 4 MG tablet Commonly known as: ZOFRAN Take 1 tablet (4 mg total) by mouth every 6 (six) hours as needed for nausea.   oxyCODONE 5 MG immediate release tablet Commonly known as: Oxy IR/ROXICODONE Take 1 tablet (5 mg total) by mouth every 4 (four) hours as needed for severe pain.   PROBIOTIC DAILY PO Take 2 capsules by mouth daily. PM   traMADol 50 MG tablet Commonly known as: ULTRAM Take 1 tablet (50 mg total) by mouth every 4 (four) hours as needed  for moderate pain.               Durable Medical Equipment  (From admission, onward)           Start     Ordered   08/26/22 1209  DME Walker rolling  Once       Question:  Patient needs a walker to treat with the following condition  Answer:  Total knee replacement status   08/26/22 1208   08/26/22 1209  DME Bedside commode  Once       Question:  Patient needs a bedside commode to treat with the following condition  Answer:  Total knee replacement status   08/26/22 1208            Disposition: Home with home health PT     Follow-up Information     Fausto Skillern, PA-C Follow up on 09/10/2022.   Specialty: Orthopedic Surgery Why: at 9:45am Contact information: [place] [address] [phone]         Dereck Leep, MD Follow up on 10/08/2022.   Specialty: Orthopedic Surgery Why: at 9:30am Contact information: [place] [address] [place], PA-C 08/27/2022, 6:06 PM

## 2022-08-28 NOTE — Telephone Encounter (Signed)
dc'd 12/28/21 "pt preference" Dr. Caryn Section  Requested Prescriptions  Refused Prescriptions Disp Refills  . hydrochlorothiazide (HYDRODIURIL) 12.5 MG tablet [Pharmacy Med Name: HYDROCHLOROTHIAZIDE 12.5 MG TB] 90 tablet     Sig: TAKE 1 TABLET BY MOUTH EVERY DAY     Cardiovascular: Diuretics - Thiazide Failed - 08/27/2022  6:21 PM      Failed - K in normal range and within 180 days    Potassium  Date Value Ref Range Status  08/26/2022 3.4 (L) 3.5 - 5.1 mmol/L Final  01/30/2012 3.8 3.5 - 5.1 mmol/L Final         Passed - Cr in normal range and within 180 days    Creatinine  Date Value Ref Range Status  01/30/2012 0.87 0.60 - 1.30 mg/dL Final   Creatinine, Ser  Date Value Ref Range Status  08/26/2022 0.80 0.61 - 1.24 mg/dL Final         Passed - Na in normal range and within 180 days    Sodium  Date Value Ref Range Status  08/26/2022 142 135 - 145 mmol/L Final  04/23/2022 140 134 - 144 mmol/L Final  01/30/2012 145 136 - 145 mmol/L Final         Passed - Last BP in normal range    BP Readings from Last 1 Encounters:  08/27/22 130/74         Passed - Valid encounter within last 6 months    Recent Outpatient Visits          4 months ago Aortic ectasia, abdominal [facility])   [facility] [name], MD   8 months ago Essential hypertension   [facility] [name], MD   9 months ago Essential hypertension   [facility] [name], MD   1 year ago Essential hypertension   [facility] [name], MD   1 year ago Medicare annual wellness visit, subsequent   [facility] [name], MD      Future Appointments            In 2 weeks Ralene Bathe, MD [place]

## 2022-09-12 ENCOUNTER — Ambulatory Visit (INDEPENDENT_AMBULATORY_CARE_PROVIDER_SITE_OTHER): Payer: Medicare Other | Admitting: Dermatology

## 2022-09-12 DIAGNOSIS — L578 Other skin changes due to chronic exposure to nonionizing radiation: Secondary | ICD-10-CM | POA: Diagnosis not present

## 2022-09-12 DIAGNOSIS — L814 Other melanin hyperpigmentation: Secondary | ICD-10-CM | POA: Diagnosis not present

## 2022-09-12 DIAGNOSIS — L821 Other seborrheic keratosis: Secondary | ICD-10-CM

## 2022-09-12 DIAGNOSIS — Z1283 Encounter for screening for malignant neoplasm of skin: Secondary | ICD-10-CM | POA: Diagnosis not present

## 2022-09-12 DIAGNOSIS — D229 Melanocytic nevi, unspecified: Secondary | ICD-10-CM

## 2022-09-12 DIAGNOSIS — L570 Actinic keratosis: Secondary | ICD-10-CM | POA: Diagnosis not present

## 2022-09-12 NOTE — Progress Notes (Signed)
Follow-Up Visit   Subjective  James Compton is a 71 y.o. male who presents for the following: Annual Exam. Hx of Aks  The patient presents for Upper Body Skin Exam (UBSE) for skin cancer screening and mole check.  The patient has spots, moles and lesions to be evaluated, some may be new or changing and the patient has concerns that these could be cancer.  Patient recently had knee replacement surgery and he has on compression stocking he decline to take off today.   The following portions of the chart were reviewed this encounter and updated as appropriate:   Tobacco  Allergies  Meds  Problems  Med Hx  Surg Hx  Fam Hx     Review of Systems:  No other skin or systemic complaints except as noted in HPI or Assessment and Plan.  Objective  Well appearing patient in no apparent distress; mood and affect are within normal limits.  All skin waist up examined.  left face x 2 (2) Erythematous thin papules/macules with gritty scale.    Assessment & Plan  AK (actinic keratosis) (2) left face x 2  Actinic keratoses are precancerous spots that appear secondary to cumulative UV radiation exposure/sun exposure over time. They are chronic with expected duration over 1 year. A portion of actinic keratoses will progress to squamous cell carcinoma of the skin. It is not possible to reliably predict which spots will progress to skin cancer and so treatment is recommended to prevent development of skin cancer.  Recommend daily broad spectrum sunscreen SPF 30+ to sun-exposed areas, reapply every 2 hours as needed.  Recommend staying in the shade or wearing long sleeves, sun glasses (UVA+UVB protection) and wide brim hats (4-inch brim around the entire circumference of the hat). Call for new or changing lesions.   Destruction of lesion - left face x 2 Complexity: simple   Destruction method: cryotherapy   Informed consent: discussed and consent obtained   Timeout:  patient name, date of birth,  surgical site, and procedure verified Lesion destroyed using liquid nitrogen: Yes   Region frozen until ice ball extended beyond lesion: Yes   Outcome: patient tolerated procedure well with no complications   Post-procedure details: wound care instructions given    Lentigines - Scattered tan macules - Due to sun exposure - Benign-appearing, observe - Recommend daily broad spectrum sunscreen SPF 30+ to sun-exposed areas, reapply every 2 hours as needed. - Call for any changes  Seborrheic Keratoses - Stuck-on, waxy, tan-brown papules and/or plaques  - Benign-appearing - Discussed benign etiology and prognosis. - Observe - Call for any changes  Melanocytic Nevi - Tan-brown and/or pink-flesh-colored symmetric macules and papules - Benign appearing on exam today - Observation - Call clinic for new or changing moles - Recommend daily use of broad spectrum spf 30+ sunscreen to sun-exposed areas.   Hemangiomas - Red papules - Discussed benign nature - Observe - Call for any changes  Actinic Damage - Chronic condition, secondary to cumulative UV/sun exposure - diffuse scaly erythematous macules with underlying dyspigmentation - Recommend daily broad spectrum sunscreen SPF 30+ to sun-exposed areas, reapply every 2 hours as needed.  - Staying in the shade or wearing long sleeves, sun glasses (UVA+UVB protection) and wide brim hats (4-inch brim around the entire circumference of the hat) are also recommended for sun protection.  - Call for new or changing lesions.  Skin cancer screening performed today.   Return in about 1 year (around 09/13/2023) for TBSE,  hx of AKs .  IMarye Round, CMA, am acting as scribe for Sarina Ser, MD .  Documentation: I have reviewed the above documentation for accuracy and completeness, and I agree with the above.  Sarina Ser, MD

## 2022-09-12 NOTE — Patient Instructions (Addendum)
Cryotherapy Aftercare  Wash gently with soap and water everyday.   Apply Vaseline and Band-Aid daily until healed.     Due to recent changes in healthcare laws, you may see results of your pathology and/or laboratory studies on MyChart before the doctors have had a chance to review them. We understand that in some cases there may be results that are confusing or concerning to you. Please understand that not all results are received at the same time and often the doctors may need to interpret multiple results in order to provide you with the best plan of care or course of treatment. Therefore, we ask that you please give us 2 business days to thoroughly review all your results before contacting the office for clarification. Should we see a critical lab result, you will be contacted sooner.   If You Need Anything After Your Visit  If you have any questions or concerns for your doctor, please call our main line at [phone] and press option 4 to reach your doctor's medical assistant. If no one answers, please leave a voicemail as directed and we will return your call as soon as possible. Messages left after 4 pm will be answered the following business day.   You may also send us a message via MyChart. We typically respond to MyChart messages within 1-2 business days.  For prescription refills, please ask your pharmacy to contact our office. Our fax number is [phone].  If you have an urgent issue when the clinic is closed that cannot wait until the next business day, you can page your doctor at the number below.    Please note that while we do our best to be available for urgent issues outside of office hours, we are not available 24/7.   If you have an urgent issue and are unable to reach us, you may choose to seek medical care at your doctor's office, retail clinic, urgent care center, or emergency room.  If you have a medical emergency, please immediately call 911 or go to the  emergency department.  Pager Numbers  - Dr. Kowalski: [phone]  - Dr. Moye: [phone]  - Dr. Stewart: [phone]  In the event of inclement weather, please call our main line at [phone] for an update on the status of any delays or closures.  Dermatology Medication Tips: Please keep the boxes that topical medications come in in order to help keep track of the instructions about where and how to use these. Pharmacies typically print the medication instructions only on the boxes and not directly on the medication tubes.   If your medication is too expensive, please contact our office at [phone] option 4 or send us a message through MyChart.   We are unable to tell what your co-pay for medications will be in advance as this is different depending on your insurance coverage. However, we may be able to find a substitute medication at lower cost or fill out paperwork to get insurance to cover a needed medication.   If a prior authorization is required to get your medication covered by your insurance company, please allow us 1-2 business days to complete this process.  Drug prices often vary depending on where the prescription is filled and some pharmacies may offer cheaper prices.  The website www.goodrx.com contains coupons for medications through different pharmacies. The prices here do not account for what the cost may be with help from insurance (it may be cheaper with your insurance), but the website can   give you the price if you did not use any insurance.  - You can print the associated coupon and take it with your prescription to the pharmacy.  - You may also stop by our office during regular business hours and pick up a GoodRx coupon card.  - If you need your prescription sent electronically to a different pharmacy, notify our office through [place] MyChart or by phone at [phone] option 4.     Si Usted Necesita Algo Despus de Su Visita  Tambin puede  enviarnos un mensaje a travs de MyChart. Por lo general respondemos a los mensajes de MyChart en el transcurso de 1 a 2 das hbiles.  Para renovar recetas, por favor pida a su farmacia que se ponga en contacto con nuestra oficina. Nuestro nmero de fax es el [phone].  Si tiene un asunto urgente cuando la clnica est cerrada y que no puede esperar hasta el siguiente da hbil, puede llamar/localizar a su doctor(a) al nmero que aparece a continuacin.   Por favor, tenga en cuenta que aunque hacemos todo lo posible para estar disponibles para asuntos urgentes fuera del horario de oficina, no estamos disponibles las 24 horas del da, los 7 das de la semana.   Si tiene un problema urgente y no puede comunicarse con nosotros, puede optar por buscar atencin mdica  en el consultorio de su doctor(a), en una clnica privada, en un centro de atencin urgente o en una sala de emergencias.  Si tiene una emergencia mdica, por favor llame inmediatamente al 911 o vaya a la sala de emergencias.  Nmeros de bper  - Dr. Kowalski: [phone]  - Dra. Moye: [phone]  - Dra. Stewart: [phone]  En caso de inclemencias del tiempo, por favor llame a nuestra lnea principal al [phone] para una actualizacin sobre el estado de cualquier retraso o cierre.  Consejos para la medicacin en dermatologa: Por favor, guarde las cajas en las que vienen los medicamentos de uso tpico para ayudarle a seguir las instrucciones sobre dnde y cmo usarlos. Las farmacias generalmente imprimen las instrucciones del medicamento slo en las cajas y no directamente en los tubos del medicamento.   Si su medicamento es muy caro, por favor, pngase en contacto con nuestra oficina llamando al [phone] y presione la opcin 4 o envenos un mensaje a travs de MyChart.   No podemos decirle cul ser su copago por los medicamentos por adelantado ya que esto es diferente dependiendo de la cobertura de su seguro.  Sin embargo, es posible que podamos encontrar un medicamento sustituto a menor costo o llenar un formulario para que el seguro cubra el medicamento que se considera necesario.   Si se requiere una autorizacin previa para que su compaa de seguros cubra su medicamento, por favor permtanos de 1 a 2 das hbiles para completar este proceso.  Los precios de los medicamentos varan con frecuencia dependiendo del lugar de dnde se surte la receta y alguna farmacias pueden ofrecer precios ms baratos.  El sitio web www.goodrx.com tiene cupones para medicamentos de diferentes farmacias. Los precios aqu no tienen en cuenta lo que podra costar con la ayuda del seguro (puede ser ms barato con su seguro), pero el sitio web puede darle el precio si no utiliz ningn seguro.  - Puede imprimir el cupn correspondiente y llevarlo con su receta a la farmacia.  - Tambin puede pasar por nuestra oficina durante el horario de atencin regular y recoger una tarjeta de cupones de GoodRx.  -   Si necesita que su receta se enve electrnicamente a una farmacia diferente, informe a nuestra oficina a travs de MyChart de [place] o por telfono llamando al [phone] y presione la opcin 4.  

## 2022-09-22 ENCOUNTER — Encounter: Payer: Self-pay | Admitting: Dermatology

## 2022-10-23 NOTE — Progress Notes (Signed)
I,April Miller,acting as a scribe for Lelon Huh, MD.,have documented all relevant documentation on the behalf of Lelon Huh, MD,as directed by  Lelon Huh, MD while in the presence of Lelon Huh, MD.   Established patient visit   Patient: James Compton   DOB: 12-11-1950   71 y.o. Male  MRN: [id number] Visit Date: 10/25/2022  Today's healthcare provider: Lelon Huh, MD   Chief Complaint  Patient presents with   Follow-up   Hypertension   Subjective    HPI  Hypertension, follow-up  BP Readings from Last 3 Encounters:  10/25/22 (!) 151/69  08/27/22 130/74  08/14/22 (!) 141/86   Wt Readings from Last 3 Encounters:  10/25/22 202 lb (91.6 kg)  08/26/22 203 lb 0.7 oz (92.1 kg)  08/14/22 203 lb 0.7 oz (92.1 kg)     He was last seen for hypertension 6 months ago.  Management since that visit includes; Well controlled. Continue current medications.  .  Outside blood pressures are 115/70-125/75.  Pertinent labs Lab Results  Component Value Date   CHOL 95 (L) 04/23/2022   HDL 35 (L) 04/23/2022   LDLCALC 26 04/23/2022   TRIG 216 (H) 04/23/2022   CHOLHDL 2.7 04/23/2022   Lab Results  Component Value Date   NA 142 08/26/2022   K 3.4 (L) 08/26/2022   CREATININE 0.80 08/26/2022   GFRNONAA >60 08/14/2022   GLUCOSE 100 (H) 08/26/2022   TSH 1.840 04/02/2021     The ASCVD Risk score (Arnett DK, et al., 2019) failed to calculate for the following reasons:   The valid total cholesterol range is 130 to 320 mg/dL  He is noted to have had mildly decreased potassium level at preop labs in October.  --------------------------------------------------------------------------------------------------- He also reports that he has been having more joint pains especially in shoulders and arms the last few months and is concerned it may be related to statin.   Medications: Outpatient Medications Prior to Visit  Medication Sig   acetaminophen (TYLENOL) 500 MG tablet  Take 1,000 mg by mouth every 8 (eight) hours as needed (pain).   atorvastatin (LIPITOR) 80 MG tablet TAKE 1 TABLET BY MOUTH EVERY DAY IN THE EVENING   cetirizine (ZYRTEC) 10 MG tablet    Coenzyme Q10 200 MG capsule Take 1 capsule by mouth 1 day or 1 dose.   gentamicin ointment (GARAMYCIN) 0.1 % Apply 1 application topically 2 (two) times daily. Apply to affected area 3 times a day   ibuprofen (ADVIL) 200 MG tablet Take 400 mg by mouth every 6 (six) hours as needed.   losartan-hydrochlorothiazide (HYZAAR) 50-12.5 MG tablet Take 1 tablet by mouth daily.   Multiple Vitamin (MULTIVITAMIN) capsule Take 1 capsule by mouth daily. PM   omeprazole (PRILOSEC) 20 MG capsule Take 1 capsule (20 mg total) by mouth daily.   Probiotic Product (PROBIOTIC DAILY PO) Take 2 capsules by mouth daily. PM   [DISCONTINUED] celecoxib (CELEBREX) 200 MG capsule Take 1 capsule (200 mg total) by mouth 2 (two) times daily.   [DISCONTINUED] enoxaparin (LOVENOX) 40 MG/0.4ML injection Inject 0.4 mLs (40 mg total) into the skin daily for 14 days.   [DISCONTINUED] fluticasone (FLONASE) 50 MCG/ACT nasal spray Place 2 sprays into both nostrils daily as needed for allergies or rhinitis.  (Patient not taking: Reported on 10/25/2022)   [DISCONTINUED] ketoconazole (NIZORAL) 2 % cream Apply 1 application topically daily as needed for irritation. (Patient not taking: Reported on 10/25/2022)   [DISCONTINUED] ondansetron (ZOFRAN) 4 MG  tablet Take 1 tablet (4 mg total) by mouth every 6 (six) hours as needed for nausea.   [DISCONTINUED] oxyCODONE (OXY IR/ROXICODONE) 5 MG immediate release tablet Take 1 tablet (5 mg total) by mouth every 4 (four) hours as needed for severe pain.   [DISCONTINUED] traMADol (ULTRAM) 50 MG tablet Take 1 tablet (50 mg total) by mouth every 4 (four) hours as needed for moderate pain.   No facility-administered medications prior to visit.    Review of Systems  Constitutional:  Negative for appetite change, chills and  fever.  Respiratory:  Negative for chest tightness, shortness of breath and wheezing.   Cardiovascular:  Negative for chest pain and palpitations.  Gastrointestinal:  Negative for abdominal pain, nausea and vomiting.       Objective    BP 136/70   Pulse (!) 59   Resp 16   Wt 202 lb (91.6 kg)   SpO2 100%   BMI 29.40 kg/m    Physical Exam  General appearance: Well developed, well nourished male, cooperative and in no acute distress Head: Normocephalic, without obvious abnormality, atraumatic Respiratory: Respirations even and unlabored, normal respiratory rate Extremities: All extremities are intact.  Skin: Skin color, texture, turgor normal. No rashes seen  Psych: Appropriate mood and affect. Neurologic: Mental status: Alert, oriented to person, place, and time, thought content appropriate.   Assessment & Plan     1. Essential hypertension Relatively well controlled. Continue current medications.    2. Hypokalemia  - Renal function panel - Magnesium  3. Arthralgia, unspecified joint He is concerned this may be due to statin. Advised may put statin on hold for 3-4 weeks to see if this improves, if so consider change to a different high intensity statin      The entirety of the information documented in the History of Present Illness, Review of Systems and Physical Exam were personally obtained by me. Portions of this information were initially documented by the CMA and reviewed by me for thoroughness and accuracy.     Lelon Huh, MD  [facility] [phone] (phone) [phone] (fax)  [place]

## 2022-10-25 ENCOUNTER — Encounter: Payer: Self-pay | Admitting: Family Medicine

## 2022-10-25 ENCOUNTER — Ambulatory Visit (INDEPENDENT_AMBULATORY_CARE_PROVIDER_SITE_OTHER): Payer: Medicare Other | Admitting: Family Medicine

## 2022-10-25 VITALS — BP 136/70 | HR 59 | Resp 16 | Wt 202.0 lb

## 2022-10-25 DIAGNOSIS — I10 Essential (primary) hypertension: Secondary | ICD-10-CM

## 2022-10-25 DIAGNOSIS — E876 Hypokalemia: Secondary | ICD-10-CM | POA: Diagnosis not present

## 2022-10-25 DIAGNOSIS — M2550 Pain in unspecified joint: Secondary | ICD-10-CM

## 2022-10-26 LAB — RENAL FUNCTION PANEL
Albumin: 4.5 g/dL (ref 3.8–4.8)
BUN/Creatinine Ratio: 20 (ref 10–24)
BUN: 17 mg/dL (ref 8–27)
CO2: 24 mmol/L (ref 20–29)
Calcium: 9.2 mg/dL (ref 8.6–10.2)
Chloride: 103 mmol/L (ref 96–106)
Creatinine, Ser: 0.85 mg/dL (ref 0.76–1.27)
Glucose: 92 mg/dL (ref 70–99)
Phosphorus: 3.2 mg/dL (ref 2.8–4.1)
Potassium: 4 mmol/L (ref 3.5–5.2)
Sodium: 143 mmol/L (ref 134–144)
eGFR: 92 mL/min/{1.73_m2} (ref >59)

## 2022-10-26 LAB — MAGNESIUM: Magnesium: 2 mg/dL (ref 1.6–2.3)

## 2022-11-20 ENCOUNTER — Telehealth: Payer: Self-pay

## 2022-11-20 DIAGNOSIS — E785 Hyperlipidemia, unspecified: Secondary | ICD-10-CM

## 2022-11-20 DIAGNOSIS — Z8673 Personal history of transient ischemic attack (TIA), and cerebral infarction without residual deficits: Secondary | ICD-10-CM

## 2022-11-20 MED ORDER — ROSUVASTATIN CALCIUM 20 MG PO TABS: 20.0000 mg | ORAL_TABLET | Freq: Every day | ORAL | 0 refills | Status: DC

## 2022-11-20 NOTE — Addendum Note (Signed)
Addended by: Lelon Huh E on: 11/20/2022 11:51 AM   Modules accepted: Orders

## 2022-11-20 NOTE — Telephone Encounter (Signed)
Patient was advised and appt scheduled.

## 2022-11-20 NOTE — Telephone Encounter (Signed)
Copied from [place] [phone]. Topic: General - Other >> Nov 20, 2022 10:43 AM Cyndi Bender wrote: Reason for CRM: Pt reports that he has been off the statin for a month as requested and he can tell a difference. Pt stated he has more energy and less joint pain and muscle tenderness.

## 2022-11-20 NOTE — Telephone Encounter (Signed)
Ok, recommend he change to rosuvastatin and follow up in 1 month. Have sent prescription [facility]. Stay off off atorvastatin.

## 2022-12-23 ENCOUNTER — Telehealth: Payer: Self-pay | Admitting: Family Medicine

## 2022-12-23 DIAGNOSIS — I10 Essential (primary) hypertension: Secondary | ICD-10-CM

## 2022-12-23 NOTE — Telephone Encounter (Signed)
Requested medication (s) are due for refill today: Amount not specified  Requested medication (s) are on the active medication list: yes    Last refill: 03/30/22  Amount not specified  Future visit scheduled yes 12/25/22  Notes to clinic:Historical provider, please review. Thank you.  Requested Prescriptions  Pending Prescriptions Disp Refills   losartan-hydrochlorothiazide (HYZAAR) 50-12.5 MG tablet [Pharmacy Med Name: LOSARTAN-HCTZ 50-12.5 MG TAB] 90 tablet 3    Sig: TAKE 1 TABLET BY MOUTH EVERY DAY     Cardiovascular: ARB + Diuretic Combos Passed - 12/23/2022  1:37 AM      Passed - K in normal range and within 180 days    Potassium  Date Value Ref Range Status  10/25/2022 4.0 3.5 - 5.2 mmol/L Final  01/30/2012 3.8 3.5 - 5.1 mmol/L Final         Passed - Na in normal range and within 180 days    Sodium  Date Value Ref Range Status  10/25/2022 143 134 - 144 mmol/L Final  01/30/2012 145 136 - 145 mmol/L Final         Passed - Cr in normal range and within 180 days    Creatinine  Date Value Ref Range Status  01/30/2012 0.87 0.60 - 1.30 mg/dL Final   Creatinine, Ser  Date Value Ref Range Status  10/25/2022 0.85 0.76 - 1.27 mg/dL Final         Passed - eGFR is 10 or above and within 180 days    EGFR (African American)  Date Value Ref Range Status  01/30/2012 >60 >72m/min Final   GFR calc Af Amer  Date Value Ref Range Status  01/14/2020 101 >59 mL/min/1.73 Final   EGFR (Non-African Amer.)  Date Value Ref Range Status  01/30/2012 >60 >677mmin Final    Comment:    eGFR values <6070min/1.73 m2 may be an indication of chronic kidney disease (CKD). Calculated eGFR, using the MRDR Study equation, is useful in  patients with stable renal function. The eGFR calculation will not be reliable in acutely ill patients when serum creatinine is changing rapidly. It is not useful in patients on dialysis. The eGFR calculation may not be applicable to patients at the low and  high extremes of body sizes, pregnant women, and vegetarians.    GFR, Estimated  Date Value Ref Range Status  08/14/2022 >60 >60 mL/min Final    Comment:    (NOTE) Calculated using the CKD-EPI Creatinine Equation (2021)    eGFR  Date Value Ref Range Status  10/25/2022 92 >59 mL/min/1.73 Final         Passed - Patient is not pregnant      Passed - Last BP in normal range    BP Readings from Last 1 Encounters:  10/25/22 136/70         Passed - Valid encounter within last 6 months    Recent Outpatient Visits           1 month ago Essential hypertension   [facility]   8 months ago [facility]   12 months ago Essential hypertension   [place] [facility]   1 year ago Essential hypertension   [facility]   1 year ago Essential hypertension   [facility], [facility]  Future Appointments             In 2 days Fisher, Kirstie Peri, MD [facility] [place]   In 2 weeks Jonathon Bellows, [facility] [place]   In 8 months Ralene Bathe, MD [place]

## 2022-12-25 ENCOUNTER — Ambulatory Visit (INDEPENDENT_AMBULATORY_CARE_PROVIDER_SITE_OTHER): Payer: Medicare Other | Admitting: Family Medicine

## 2022-12-25 ENCOUNTER — Encounter: Payer: Self-pay | Admitting: Family Medicine

## 2022-12-25 VITALS — BP 124/72 | HR 64 | Temp 97.8°F | Resp 16 | Wt 208.6 lb

## 2022-12-25 DIAGNOSIS — E785 Hyperlipidemia, unspecified: Secondary | ICD-10-CM | POA: Diagnosis not present

## 2022-12-25 DIAGNOSIS — Z8673 Personal history of transient ischemic attack (TIA), and cerebral infarction without residual deficits: Secondary | ICD-10-CM

## 2022-12-25 NOTE — Progress Notes (Signed)
I,Sulibeya S Dimas,acting as a scribe for Lelon Huh, MD.,have documented all relevant documentation on the behalf of Lelon Huh, MD,as directed by  Lelon Huh, MD while in the presence of Lelon Huh, MD.     Established patient visit   Patient: James Compton   DOB: [birth year]   72 y.o. Male  MRN: [id number] Visit Date: 12/25/2022  Today's healthcare provider: Lelon Huh, MD   Chief Complaint  Patient presents with   Hyperlipidemia   Subjective      Follow up for hyperlipidemia  The patient was last seen for this 1 months ago. Changes made at last visit include change atorvastatin to rosuvastatin due to aches and pains in his hands.   He reports excellent compliance with treatment. He feels that condition is Improved. Aches in his hands and arms have resolved since changing to rosuvastatin. He is not having side effects.  No longer having tenderness in muscles -----------------------------------------------------------------------------------------   Medications: Outpatient Medications Prior to Visit  Medication Sig   acetaminophen (TYLENOL) 500 MG tablet Take 1,000 mg by mouth every 8 (eight) hours as needed (pain).   cetirizine (ZYRTEC) 10 MG tablet    Coenzyme Q10 200 MG capsule Take 1 capsule by mouth 1 day or 1 dose.   gentamicin ointment (GARAMYCIN) 0.1 % Apply 1 application topically 2 (two) times daily. Apply to affected area 3 times a day   ibuprofen (ADVIL) 200 MG tablet Take 400 mg by mouth every 6 (six) hours as needed.   losartan-hydrochlorothiazide (HYZAAR) 50-12.5 MG tablet Take 1 tablet by mouth daily.   Multiple Vitamin (MULTIVITAMIN) capsule Take 1 capsule by mouth daily. PM   omeprazole (PRILOSEC) 20 MG capsule Take 1 capsule (20 mg total) by mouth daily.   Probiotic Product (PROBIOTIC DAILY PO) Take 2 capsules by mouth daily. PM   rosuvastatin (CRESTOR) 20 MG tablet Take 1 tablet (20 mg total) by mouth daily.   No facility-administered  medications prior to visit.    Review of Systems  Constitutional:  Negative for chills, fatigue and fever.  Eyes:  Negative for visual disturbance.  Respiratory:  Negative for chest tightness and shortness of breath.   Cardiovascular:  Negative for chest pain and leg swelling.  Gastrointestinal:  Negative for abdominal pain, nausea and vomiting.  Musculoskeletal:  Negative for myalgias.  Neurological:  Negative for dizziness, light-headedness and headaches.       Objective    BP 124/72 (BP Location: Right Arm, Patient Position: Sitting, Cuff Size: Large)   Pulse 64   Temp 97.8 F (36.6 C) (Temporal)   Resp 16   Wt 208 lb 9.6 oz (94.6 kg)   BMI 30.36 kg/m  BP Readings from Last 3 Encounters:  12/25/22 124/72  10/25/22 136/70  08/27/22 130/74      Assessment & Plan     1. History of TIA (transient ischemic attack) Asymptomatic. Compliant with medication.  Continue aggressive risk factor modification.  Needs to be on high intensity statin.   2. Hyperlipidemia, unspecified hyperlipidemia type He is tolerating rosuvastatin well with no adverse effects.   Arthralgias resolved since stopping atorvastatin.  - Comprehensive metabolic panel - Lipid panel      The entirety of the information documented in the History of Present Illness, Review of Systems and Physical Exam were personally obtained by me. Portions of this information were initially documented by the CMA and reviewed by me for thoroughness and accuracy.     Lelon Huh, MD  [facility]  [place] [phone] (phone) [phone] (fax)  [place]

## 2022-12-26 LAB — LIPID PANEL
Chol/HDL Ratio: 2.9 ratio (ref 0.0–5.0)
Cholesterol, Total: 110 mg/dL (ref 100–199)
HDL: 38 mg/dL — ABNORMAL LOW (ref >39)
LDL Chol Calc (NIH): 46 mg/dL (ref 0–99)
Triglycerides: 154 mg/dL — ABNORMAL HIGH (ref 0–149)
VLDL Cholesterol Cal: 26 mg/dL (ref 5–40)

## 2022-12-26 LAB — COMPREHENSIVE METABOLIC PANEL
ALT: 13 IU/L (ref 0–44)
AST: 26 IU/L (ref 0–40)
Albumin/Globulin Ratio: 2.1 (ref 1.2–2.2)
Albumin: 4.5 g/dL (ref 3.8–4.8)
Alkaline Phosphatase: 158 IU/L — ABNORMAL HIGH (ref 44–121)
BUN/Creatinine Ratio: 15 (ref 10–24)
BUN: 14 mg/dL (ref 8–27)
Bilirubin Total: 0.9 mg/dL (ref 0.0–1.2)
CO2: 24 mmol/L (ref 20–29)
Calcium: 9 mg/dL (ref 8.6–10.2)
Chloride: 103 mmol/L (ref 96–106)
Creatinine, Ser: 0.94 mg/dL (ref 0.76–1.27)
Globulin, Total: 2.1 g/dL (ref 1.5–4.5)
Glucose: 102 mg/dL — ABNORMAL HIGH (ref 70–99)
Potassium: 4.1 mmol/L (ref 3.5–5.2)
Sodium: 141 mmol/L (ref 134–144)
Total Protein: 6.6 g/dL (ref 6.0–8.5)
eGFR: 86 mL/min/{1.73_m2} (ref >59)

## 2022-12-30 MED ORDER — LOSARTAN POTASSIUM-HCTZ 50-12.5 MG PO TABS: 1.0000 | ORAL_TABLET | Freq: Every day | ORAL | 4 refills | Status: DC

## 2022-12-30 NOTE — Telephone Encounter (Signed)
Patient is calling back to check on his refill for his losartan-hydrochlorothiazide (HYZAAR) 50-12.5 MG tablet .  Per patient his pharmacy advised him to call his PCP to check on his refill.  Please advise  [id number] [phone] Fax: [phone]

## 2022-12-30 NOTE — Addendum Note (Signed)
Addended by: [name] on: 12/30/2022 05:52 PM   Modules accepted: Orders

## 2022-12-30 NOTE — Telephone Encounter (Signed)
Please advise 

## 2023-01-06 ENCOUNTER — Telehealth: Payer: Self-pay | Admitting: Family Medicine

## 2023-01-06 NOTE — Telephone Encounter (Signed)
Pt states that he is needing a new CPAP machine, his current machine is making a noise. Per pt [facility] he received  his current CPAP machine and states that the machine is helping him. [facility] is needing his PCP to send over paper work stating it is ok for pt to get a new machine.   [place] [phone] Fax Number: [phone]    Please advise

## 2023-01-07 ENCOUNTER — Other Ambulatory Visit: Payer: Self-pay

## 2023-01-07 DIAGNOSIS — G4733 Obstructive sleep apnea (adult) (pediatric): Secondary | ICD-10-CM

## 2023-01-07 NOTE — Telephone Encounter (Signed)
CPAP and supplies ordered

## 2023-01-09 ENCOUNTER — Encounter: Payer: Self-pay | Admitting: Gastroenterology

## 2023-01-09 ENCOUNTER — Ambulatory Visit (INDEPENDENT_AMBULATORY_CARE_PROVIDER_SITE_OTHER): Payer: Medicare Other | Admitting: Gastroenterology

## 2023-01-09 VITALS — BP 122/69 | HR 70 | Temp 98.1°F | Ht 69.0 in | Wt 210.0 lb

## 2023-01-09 DIAGNOSIS — R748 Abnormal levels of other serum enzymes: Secondary | ICD-10-CM | POA: Diagnosis not present

## 2023-01-09 NOTE — Progress Notes (Signed)
James Bellows MD, MRCP(U.K) [address]  [place]  [place], [place] [address]  Main: [phone]  Fax: [phone]   Primary Care Physician: [name], MD  Primary Gastroenterologist:  Dr. Jonathon Compton   Chief Complaint  Patient presents with   elevated alkaline phosphatase level    HPI: James Compton is a 72 y.o. male  Summary of history : Initially seen and referred for nausea on 07/17/2021.  05/15/2021 right upper quadrant ultrasound showed no abnormalities  May 2022 CMP demonstrated elevated alkaline phosphatase 161 total bilirubin of 1.4 normal AST and ALT hemoglobin normal TSH normal.Last colonoscopy in May 2016 by Dr. Allen Norris.   He states that he has occasional nausea.  Does not follow clear pattern.  Can start all of a sudden.  And resolve all of a sudden.  Denies any abdominal pain.  No new medications.  Denies any heartburn.  He is on Prilosec but do not take it regularly.  He has had abnormal alkaline phosphatase he recollects for more than 4 to 5 years.  No clear etiology was given.   07/17/2021: PTH, calcium was normal.  GGT 48 normal.  Not immune to hepatitis B and ceruloplasmin level was low requiring 24-hour urinary copper and ophthalmology exam to rule out KF rings.  Immune to hepatitis A.  ANA positive smooth muscle antibody negative ceruloplasmin 12 iron studies normal immunoglobulins normal celiac serology negative.  Antimitochondrial antibody negative He states that he has had a recent ophthalmic exam and no abnormalities were noted.  09/11/2021: 24-hour copper urine excretion normal  01/14/2022: Serum copper 68 (low normal), ceruloplasmin 13.7, GGT normal.     Interval history 01/31/2022 2250 seen 2024     He was seen by [facility] liver transplant team in March 2023 appears an MRI of the liver was ordered I cannot see any results of the same   12/25/2022 alkaline phosphatase 158 otherwise his labs are normal. He says that after his last visit he was seen at  [facility] had an MRI of the liver and was told everything was fine.  He was told he had fatty liver.  His alkaline phosphatase has been stable at 158 for over a year. Current Outpatient Medications  Medication Sig Dispense Refill   acetaminophen (TYLENOL) 500 MG tablet Take 1,000 mg by mouth every 8 (eight) hours as needed (pain).     cetirizine (ZYRTEC) 10 MG tablet      Coenzyme Q10 200 MG capsule Take 1 capsule by mouth 1 day or 1 dose.     gentamicin ointment (GARAMYCIN) 0.1 % Apply 1 application topically 2 (two) times daily. Apply to affected area 3 times a day     ibuprofen (ADVIL) 200 MG tablet Take 400 mg by mouth every 6 (six) hours as needed.     losartan-hydrochlorothiazide (HYZAAR) 50-12.5 MG tablet Take 1 tablet by mouth daily. 90 tablet 4   Multiple Vitamin (MULTIVITAMIN) capsule Take 1 capsule by mouth daily. PM     omeprazole (PRILOSEC) 20 MG capsule Take 1 capsule (20 mg total) by mouth daily. 90 capsule 3   Probiotic Product (PROBIOTIC DAILY PO) Take 2 capsules by mouth daily. PM     rosuvastatin (CRESTOR) 20 MG tablet Take 1 tablet (20 mg total) by mouth daily. 90 tablet 0   No current facility-administered medications for this visit.    Allergies as of 01/09/2023 - Review Complete 01/09/2023  Allergen Reaction Noted   Dipyridamole  11/03/2018  Penicillins Nausea Only 04/06/2015    ROS:  General: Negative for anorexia, weight loss, fever, chills, fatigue, weakness. ENT: Negative for hoarseness, difficulty swallowing , nasal congestion. CV: Negative for chest pain, angina, palpitations, dyspnea on exertion, peripheral edema.  Respiratory: Negative for dyspnea at rest, dyspnea on exertion, cough, sputum, wheezing.  GI: See history of present illness. GU:  Negative for dysuria, hematuria, urinary incontinence, urinary frequency, nocturnal urination.  Endo: Negative for unusual weight change.    Physical Examination:   BP (!) 153/71   Pulse 72   Temp 98.1 F (36.7 C)  (Oral)   Ht 5' 9"$  (1.753 m)   Wt 210 lb (95.3 kg)   BMI 31.01 kg/m   General: Well-nourished, well-developed in no acute distress.  Eyes: No icterus. Conjunctivae pink. Neuro: Alert and oriented x 3.  Grossly intact. Skin: Warm and dry, no jaundice.   Psych: Alert and cooperative, normal mood and affect.   Imaging Studies: No results found.  Assessment and Plan:   James Compton is a 72 y.o. y/o male here to follow-up for episodic nausea and elevated alkaline phosphatase.  The nausea has significantly improved after commencing him on Prilosec 40 mg once a day for acid reflux.  Urinary copper level checked was normal.  He does not have any neuropsychiatric issues concerning for Wilson's disease .  I rechecked his ceruloplasmin levels and it is still very low.  His GGT is normal.  I explained to him that his clinical picture is not fitting that of Wilson's disease.  Was subsequently seen at [facility] and he states that he was told that his liver had only fatty liver and nothing else I cannot see the MRI result which he says he has had and was told was normal.  We will try and obtain it from his doctor at [facility] and since he has fatty liver we will see him back in 1 years time to see if he would be a candidate for any of the new drugs which may be coming out.  Dr James Bellows  MD,MRCP [facility]) Follow up in 1 year

## 2023-01-15 ENCOUNTER — Telehealth: Payer: Self-pay

## 2023-01-15 NOTE — Telephone Encounter (Signed)
Done

## 2023-01-15 NOTE — Telephone Encounter (Signed)
Copied from [place] [id number]. Topic: General - Other >> Jan 15, 2023  9:37 AM Everette C wrote: Reason for CRM: The patient has called to follow up on their previous request for a CPAP machine to be ordered  The patient has been in contact with [employer] and told that no new machine has been ordered at the time for them   Please contact [employer] further when possible  [employer] : [phone] Fax : [phone]

## 2023-02-04 ENCOUNTER — Telehealth: Payer: Self-pay | Admitting: Family Medicine

## 2023-02-04 NOTE — Telephone Encounter (Signed)
Pt is currently on rosuvastatin (CRESTOR) 20 MG tablet [id number] and he says he is beginning to have the same issues as he did with the previous cholesterol medication. Pt wants to know if Dr. Caryn Section could prescribe him something else or what the next steps are. Please follow up with pt.

## 2023-02-05 NOTE — Telephone Encounter (Signed)
It's often tolerated better by taking 2-3 days a week instead of every day. Put on hold for 1 week, then restart by taking just on Mondays, Wednesdays and Fridays.

## 2023-02-06 NOTE — Telephone Encounter (Signed)
Patient advised and states he has actually already started taking it 3 times weekly and is doing better.

## 2023-02-15 ENCOUNTER — Other Ambulatory Visit: Payer: Self-pay | Admitting: Family Medicine

## 2023-02-15 DIAGNOSIS — Z8673 Personal history of transient ischemic attack (TIA), and cerebral infarction without residual deficits: Secondary | ICD-10-CM

## 2023-02-15 DIAGNOSIS — E785 Hyperlipidemia, unspecified: Secondary | ICD-10-CM

## 2023-02-17 NOTE — Telephone Encounter (Signed)
Requested Prescriptions  Pending Prescriptions Disp Refills   rosuvastatin (CRESTOR) 20 MG tablet [Pharmacy Med Name: ROSUVASTATIN CALCIUM 20 MG TAB] 90 tablet 0    Sig: TAKE 1 TABLET BY MOUTH EVERY DAY     Cardiovascular:  Antilipid - Statins 2 Failed - 02/15/2023  8:35 AM      Failed - Lipid Panel in normal range within the last 12 months    Cholesterol, Total  Date Value Ref Range Status  12/25/2022 110 100 - 199 mg/dL Final   LDL Chol Calc (NIH)  Date Value Ref Range Status  12/25/2022 46 0 - 99 mg/dL Final   HDL  Date Value Ref Range Status  12/25/2022 38 (L) >39 mg/dL Final   Triglycerides  Date Value Ref Range Status  12/25/2022 154 (H) 0 - 149 mg/dL Final         Passed - Cr in normal range and within 360 days    Creatinine  Date Value Ref Range Status  01/30/2012 0.87 0.60 - 1.30 mg/dL Final   Creatinine, Ser  Date Value Ref Range Status  12/25/2022 0.94 0.76 - 1.27 mg/dL Final         Passed - Patient is not pregnant      Passed - Valid encounter within last 12 months    Recent Outpatient Visits           1 month ago History of TIA (transient ischemic attack)   [place], Donald E, MD   3 months ago Essential hypertension   [place], Donald E, MD   10 months ago Aortic ectasia, abdominal [facility])   [place], Donald E, MD   1 year ago Essential hypertension   [place], Donald E, MD   1 year ago Essential hypertension   [place], Donald E, MD       Future Appointments             In 7 months Ralene Bathe, MD [place]

## 2023-05-07 ENCOUNTER — Ambulatory Visit (INDEPENDENT_AMBULATORY_CARE_PROVIDER_SITE_OTHER): Payer: Medicare Other

## 2023-05-07 VITALS — Ht 70.0 in | Wt 207.0 lb

## 2023-05-07 DIAGNOSIS — Z0000 Encounter for general adult medical examination without abnormal findings: Secondary | ICD-10-CM

## 2023-05-07 NOTE — Patient Instructions (Signed)
[name] , Thank you for taking time to come for your Medicare Wellness Visit. I appreciate your ongoing commitment to your health goals. Please review the following plan we discussed and let me know if I can assist you in the future.   These are the goals we discussed:  Goals      DIET - REDUCE CALORIE INTAKE     Recommend to cut back on heavy carbohydrates to help aid in weight loss.         This is a list of the screening recommended for you and due dates:  Health Maintenance  Topic Date Due   Yearly kidney health urinalysis for diabetes  Never done   Zoster (Shingles) Vaccine (2 of 2) 05/28/2021   COVID-19 Vaccine (4 - 2023-24 season) 07/26/2022   Flu Shot  06/26/2023   Yearly kidney function blood test for diabetes  12/26/2023   Medicare Annual Wellness Visit  05/06/2024   Colon Cancer Screening  04/13/2025   DTaP/Tdap/Td vaccine (6 - Td or Tdap) 04/03/2031   Pneumonia Vaccine  Completed   Hepatitis C Screening  Completed   HPV Vaccine  Aged Out    Advanced directives: yes  Conditions/risks identified: none  Next appointment: Follow up in one year for your annual wellness visit. 05/11/2024 @ 8:15am telephone  Preventive Care 65 Years and Older, Male  Preventive care refers to lifestyle choices and visits with your health care provider that can promote health and wellness. What does preventive care include? A yearly physical exam. This is also called an annual well check. Dental exams once or twice a year. Routine eye exams. Ask your health care provider how often you should have your eyes checked. Personal lifestyle choices, including: Daily care of your teeth and gums. Regular physical activity. Eating a healthy diet. Avoiding tobacco and drug use. Limiting alcohol use. Practicing safe sex. Taking low doses of aspirin every day. Taking vitamin and mineral supplements as recommended by your health care provider. What happens during an annual well check? The  services and screenings done by your health care provider during your annual well check will depend on your age, overall health, lifestyle risk factors, and family history of disease. Counseling  Your health care provider may ask you questions about your: Alcohol use. Tobacco use. Drug use. Emotional well-being. Home and relationship well-being. Sexual activity. Eating habits. History of falls. Memory and ability to understand (cognition). Work and work Astronomer. Screening  You may have the following tests or measurements: Height, weight, and BMI. Blood pressure. Lipid and cholesterol levels. These may be checked every 5 years, or more frequently if you are over 19 years old. Skin check. Lung cancer screening. You may have this screening every year starting at age 74 if you have a 30-pack-year history of smoking and currently smoke or have quit within the past 15 years. Fecal occult blood test (FOBT) of the stool. You may have this test every year starting at age 74. Flexible sigmoidoscopy or colonoscopy. You may have a sigmoidoscopy every 5 years or a colonoscopy every 10 years starting at age 74. Prostate cancer screening. Recommendations will vary depending on your family history and other risks. Hepatitis C blood test. Hepatitis B blood test. Sexually transmitted disease (STD) testing. Diabetes screening. This is done by checking your blood sugar (glucose) after you have not eaten for a while (fasting). You may have this done every 1-3 years. Abdominal aortic aneurysm (AAA) screening. You may need this if you  are a current or former smoker. Osteoporosis. You may be screened starting at age 74 if you are at high risk. Talk with your health care provider about your test results, treatment options, and if necessary, the need for more tests. Vaccines  Your health care provider may recommend certain vaccines, such as: Influenza vaccine. This is recommended every year. Tetanus,  diphtheria, and acellular pertussis (Tdap, Td) vaccine. You may need a Td booster every 10 years. Zoster vaccine. You may need this after age 74. Pneumococcal 13-valent conjugate (PCV13) vaccine. One dose is recommended after age 74. Pneumococcal polysaccharide (PPSV23) vaccine. One dose is recommended after age 74. Talk to your health care provider about which screenings and vaccines you need and how often you need them. This information is not intended to replace advice given to you by your health care provider. Make sure you discuss any questions you have with your health care provider. Document Released: 12/08/2015 Document Revised: 07/31/2016 Document Reviewed: 09/12/2015 Elsevier Interactive Patient Education  2017 [place] Prevention in the Home Falls can cause injuries. They can happen to people of all ages. There are many things you can do to make your home safe and to help prevent falls. What can I do on the outside of my home? Regularly fix the edges of walkways and driveways and fix any cracks. Remove anything that might make you trip as you walk through a door, such as a raised step or threshold. Trim any bushes or trees on the path to your home. Use bright outdoor lighting. Clear any walking paths of anything that might make someone trip, such as rocks or tools. Regularly check to see if handrails are loose or broken. Make sure that both sides of any steps have handrails. Any raised decks and porches should have guardrails on the edges. Have any leaves, snow, or ice cleared regularly. Use sand or salt on walking paths during winter. Clean up any spills in your garage right away. This includes oil or grease spills. What can I do in the bathroom? Use night lights. Install grab bars by the toilet and in the tub and shower. Do not use towel bars as grab bars. Use non-skid mats or decals in the tub or shower. If you need to sit down in the shower, use a plastic,  non-slip stool. Keep the floor dry. Clean up any water that spills on the floor as soon as it happens. Remove soap buildup in the tub or shower regularly. Attach bath mats securely with double-sided non-slip rug tape. Do not have throw rugs and other things on the floor that can make you trip. What can I do in the bedroom? Use night lights. Make sure that you have a light by your bed that is easy to reach. Do not use any sheets or blankets that are too big for your bed. They should not hang down onto the floor. Have a firm chair that has side arms. You can use this for support while you get dressed. Do not have throw rugs and other things on the floor that can make you trip. What can I do in the kitchen? Clean up any spills right away. Avoid walking on wet floors. Keep items that you use a lot in easy-to-reach places. If you need to reach something above you, use a strong step stool that has a grab bar. Keep electrical cords out of the way. Do not use floor polish or wax that makes floors slippery. If you  must use wax, use non-skid floor wax. Do not have throw rugs and other things on the floor that can make you trip. What can I do with my stairs? Do not leave any items on the stairs. Make sure that there are handrails on both sides of the stairs and use them. Fix handrails that are broken or loose. Make sure that handrails are as long as the stairways. Check any carpeting to make sure that it is firmly attached to the stairs. Fix any carpet that is loose or worn. Avoid having throw rugs at the top or bottom of the stairs. If you do have throw rugs, attach them to the floor with carpet tape. Make sure that you have a light switch at the top of the stairs and the bottom of the stairs. If you do not have them, ask someone to add them for you. What else can I do to help prevent falls? Wear shoes that: Do not have high heels. Have rubber bottoms. Are comfortable and fit you well. Are closed  at the toe. Do not wear sandals. If you use a stepladder: Make sure that it is fully opened. Do not climb a closed stepladder. Make sure that both sides of the stepladder are locked into place. Ask someone to hold it for you, if possible. Clearly mark and make sure that you can see: Any grab bars or handrails. First and last steps. Where the edge of each step is. Use tools that help you move around (mobility aids) if they are needed. These include: Canes. Walkers. Scooters. Crutches. Turn on the lights when you go into a dark area. Replace any light bulbs as soon as they burn out. Set up your furniture so you have a clear path. Avoid moving your furniture around. If any of your floors are uneven, fix them. If there are any pets around you, be aware of where they are. Review your medicines with your doctor. Some medicines can make you feel dizzy. This can increase your chance of falling. Ask your doctor what other things that you can do to help prevent falls. This information is not intended to replace advice given to you by your health care provider. Make sure you discuss any questions you have with your health care provider. Document Released: 09/07/2009 Document Revised: 04/18/2016 Document Reviewed: 12/16/2014 Elsevier Interactive Patient Education  2017 [employer].

## 2023-05-07 NOTE — Progress Notes (Signed)
I connected with  DEMONE Compton on 05/07/23 by a audio enabled telemedicine application and verified that I am speaking with the correct person using two identifiers.  Patient Location: Home  Provider Location: Office/Clinic  I discussed the limitations of evaluation and management by telemedicine. The patient expressed understanding and agreed to proceed.  Subjective:   James Compton is a 72 y.o. male who presents for Medicare Annual/Subsequent preventive examination.  Review of Systems    Cardiac Risk Factors include: advanced age (>73men, >29 women);dyslipidemia;hypertension;male gender    Objective:    Today's Vitals   05/07/23 0815 05/07/23 0817  Weight: 207 lb (93.9 kg)   Height: 5\' 10"  (1.778 m)   PainSc:  1    Body mass index is 29.7 kg/m.     05/07/2023    8:28 AM 08/26/2022    1:13 PM 08/14/2022    1:27 PM 11/22/2019    8:53 AM 11/30/2018    1:30 PM 11/20/2018    9:58 AM 11/11/2018   11:50 AM  Advanced Directives  Does Patient Have a Medical Advance Directive? Yes Yes Yes Yes Yes Yes   Type of Estate agent of [place];Living will Healthcare Power of [employer] of [place];Living will Living will Healthcare Power of [place];Living will   Does patient want to make changes to medical advance directive?  No - Patient declined   No - Patient declined    Copy of Healthcare Power of Attorney in Chart?    Yes - validated most recent copy scanned in chart (See row information) No - copy requested Yes - validated most recent copy scanned in chart (See row information) Yes - validated most recent copy scanned in chart (See row information)    Current Medications (verified) Outpatient Encounter Medications as of 05/07/2023  Medication Sig   acetaminophen (TYLENOL) 500 MG tablet Take 1,000 mg by mouth every 8 (eight) hours as needed (pain).   cetirizine (ZYRTEC) 10 MG tablet    Coenzyme Q10 200 MG capsule Take 1 capsule by mouth 1 day or 1  dose.   gentamicin ointment (GARAMYCIN) 0.1 % Apply 1 application topically 2 (two) times daily. Apply to affected area 3 times a day   ibuprofen (ADVIL) 200 MG tablet Take 400 mg by mouth every 6 (six) hours as needed.   losartan-hydrochlorothiazide (HYZAAR) 50-12.5 MG tablet Take 1 tablet by mouth daily.   Multiple Vitamin (MULTIVITAMIN) capsule Take 1 capsule by mouth daily. PM   omeprazole (PRILOSEC) 20 MG capsule Take 1 capsule (20 mg total) by mouth daily.   Probiotic Product (PROBIOTIC DAILY PO) Take 2 capsules by mouth daily. PM   rosuvastatin (CRESTOR) 20 MG tablet TAKE 1 TABLET BY MOUTH EVERY DAY   No facility-administered encounter medications on file as of 05/07/2023.    Allergies (verified) Dipyridamole and Penicillins   History: Past Medical History:  Diagnosis Date   Actinic keratosis    Arthritis    Osteo - knees   Complication of anesthesia    bowels "feel asleep" after knee surgery   GERD (gastroesophageal reflux disease)    occas   Hypertension    Pneumonia    PONV (postoperative nausea and vomiting)    Sleep apnea    Status post arthroscopy 04/11/2017   Right Knee May 2018 Dr. Ernest Pine   Stroke [facility])    TIA 2017   TIA (transient ischemic attack) 11/03/2016   Vertigo 01/2011   patient reports he ate lots of  candy that day   Wears hearing aid    bilateral   Wears partial dentures    upper   Past Surgical History:  Procedure Laterality Date   CHOLECYSTECTOMY  11/25/2005   COLONOSCOPY N/A 04/14/2015   Procedure: COLONOSCOPY;  Surgeon: Midge Minium, MD;  Location: [facility];  Service: Gastroenterology;  Laterality: N/A;   EYE SURGERY     cataract both eyes   KNEE ARTHROPLASTY Right 11/30/2018   Procedure: COMPUTER ASSISTED TOTAL KNEE ARTHROPLASTY;  Surgeon: Donato Heinz, MD;  Location: [facility];  Service: Orthopedics;  Laterality: Right;   KNEE ARTHROPLASTY Left 08/26/2022   Procedure: COMPUTER ASSISTED TOTAL KNEE ARTHROPLASTY;  Surgeon:  Donato Heinz, MD;  Location: [facility];  Service: Orthopedics;  Laterality: Left;   KNEE ARTHROSCOPY Right 11/25/2005   KNEE ARTHROSCOPY WITH MEDIAL MENISECTOMY  04/02/2017   Procedure: KNEE ARTHROSCOPY WITH MEDIAL MENISECTOMY CHONDRAPLASTY, SYNOVECTOMY;  Surgeon: Donato Heinz, MD;  Location: [facility];  Service: Orthopedics;;   POLYPECTOMY  04/14/2015   Procedure: POLYPECTOMY INTESTINAL;  Surgeon: Midge Minium, MD;  Location: [facility];  Service: Gastroenterology;;   Family History  Problem Relation Age of Onset   Alzheimer's disease Mother    Diabetes Father        pre diabetic   Healthy Sister    Healthy Brother    Healthy Daughter    Healthy Son    Healthy Daughter    Social History   Socioeconomic History   Marital status: Married    Spouse name: James Compton   Number of children: 3   Years of education: Not on file   Highest education level: Some college, no degree  Occupational History   Not on file  Tobacco Use   Smoking status: Former    Types: Cigarettes    Quit date: 11/25/1970    Years since quitting: 52.4   Smokeless tobacco: Never  Vaping Use   Vaping Use: Never used  Substance and Sexual Activity   Alcohol use: No   Drug use: No   Sexual activity: Not on file  Other Topics Concern   Not on file  Social History Narrative   Not on file   Social Determinants of Health   Financial Resource Strain: Low Risk  (05/07/2023)   Overall Financial Resource Strain (CARDIA)    Difficulty of Paying Living Expenses: Not hard at all  Food Insecurity: No Food Insecurity (05/07/2023)   Hunger Vital Sign    Worried About Running Out of Food in the Last Year: Never true    Ran Out of Food in the Last Year: Never true  Transportation Needs: No Transportation Needs (05/07/2023)   PRAPARE - Administrator, Civil Service (Medical): No    Lack of Transportation (Non-Medical): No  Physical Activity: Sufficiently Active (05/07/2023)   Exercise Vital Sign     Days of Exercise per Week: 5 days    Minutes of Exercise per Session: 60 min  Stress: No Stress Concern Present (05/07/2023)   [employer] of Occupational Health - Occupational Stress Questionnaire    Feeling of Stress : Not at all  Social Connections: Socially Integrated (05/07/2023)   Social Connection and Isolation Panel [NHANES]    Frequency of Communication with Friends and Family: More than three times a week    Frequency of Social Gatherings with Friends and Family: Three times a week    Attends Religious Services: More than 4 times per year    Active  Member of Clubs or Organizations: Yes    Attends Engineer, structural: More than 4 times per year    Marital Status: Married    Tobacco Counseling Counseling given: Not Answered  Clinical Intake:  Pre-visit preparation completed: Yes  Pain : 0-10 Pain Score: 1  Pain Type: Chronic pain Pain Location: Shoulder Pain Orientation: Left Pain Descriptors / Indicators: Aching Pain Onset: More than a month ago Pain Frequency: Intermittent     BMI - recorded: 29.7 Nutritional Status: BMI 25 -29 Overweight Nutritional Risks: None Diabetes: No  How often do you need to have someone help you when you read instructions, pamphlets, or other written materials from your doctor or pharmacy?: 1 - Never  Diabetic?no  Interpreter Needed?: No  Comments: lives with wife Information entered by :: B.Ivanna Kocak,LPN   Activities of Daily Living    05/07/2023    8:29 AM 10/25/2022    8:09 AM  In your present state of health, do you have any difficulty performing the following activities:  Hearing?  1  Vision? 0 0  Difficulty concentrating or making decisions? 0 0  Walking or climbing stairs? 0 0  Dressing or bathing? 0 0  Doing errands, shopping?  0  Preparing Food and eating ? N   Using the Toilet? N   In the past six months, have you accidently leaked urine? N   Do you have problems with loss of bowel control? N    Managing your Medications? N   Managing your Finances? N   Housekeeping or managing your Housekeeping? N     Patient Care Team: Malva Limes, MD as PCP - General (Family Medicine) Lady Gary Darlin Priestly, MD as Consulting Physician (Cardiology) Ernest Pine Illene Labrador, MD as Consulting Physician (Orthopedic Surgery) Linus Salmons, MD as Consulting Physician (Otolaryngology) Naoma Diener, NP as Nurse Practitioner (Neurology)  Indicate any recent Medical Services you may have received from other than [facility] providers in the past year (date may be approximate).     Assessment:   This is a routine wellness examination for Jaleek.  Hearing/Vision screen Hearing Screening - Comments:: Adequate hearing w/hearing aids Vision Screening - Comments:: Adequate vision after cataract;only readers [facility]  Dietary issues and exercise activities discussed: Current Exercise Habits: Home exercise routine, Type of exercise: walking (golf, mowing yards,very active), Time (Minutes): > 60, Frequency (Times/Week): 5, Weekly Exercise (Minutes/Week): 0, Intensity: Mild, Exercise limited by: orthopedic condition(s)   Goals Addressed   None    Depression Screen    05/07/2023    8:25 AM 10/25/2022    8:09 AM 04/19/2022    2:10 PM 04/02/2021    8:42 AM 11/22/2019    8:54 AM 11/22/2019    8:50 AM 11/20/2018    9:59 AM  PHQ 2/9 Scores  PHQ - 2 Score 0 0 0 0 0 0 0  PHQ- 9 Score  0 1 0       Fall Risk    05/07/2023    8:21 AM 10/25/2022    8:09 AM 04/18/2022    2:36 PM 04/02/2021    8:41 AM 11/22/2019    8:53 AM  Fall Risk   Falls in the past year? 0 0 0 1 0  Number falls in past yr: 0 0 0 0 0  Injury with Fall? 0 0 0 0 0  Risk for fall due to : No Fall Risks No Fall Risks     Follow up Education provided;Falls prevention discussed Falls evaluation completed  FALL RISK PREVENTION PERTAINING TO THE HOME:  Any stairs in or around the home? Yes  If so, are there any without handrails? Yes  Home  free of loose throw rugs in walkways, pet beds, electrical cords, etc? Yes  Adequate lighting in your home to reduce risk of falls? Yes   ASSISTIVE DEVICES UTILIZED TO PREVENT FALLS:  Life alert? No  Use of a cane, walker or w/c? No  Grab bars in the bathroom? No  Shower chair or bench in shower? No  Elevated toilet seat or a handicapped toilet? No    Cognitive Function:        05/07/2023    8:36 AM 10/31/2017    9:16 AM  6CIT Screen  What Year? 0 points 0 points  What month? 0 points 0 points  What time? 0 points 0 points  Count back from 20 0 points 0 points  Months in reverse 0 points 2 points  Repeat phrase 0 points 0 points  Total Score 0 points 2 points    Immunizations Immunization History  Administered Date(s) Administered   Fluad Quad(high Dose 65+) 08/04/2019, 11/05/2021, 08/27/2022   Hepatitis A, Adult 02/28/2011   Hepb-cpg 09/10/2021, 10/11/2021   Influenza, High Dose Seasonal PF 08/26/2017, 08/26/2017, 09/24/2018, 09/24/2018   Influenza-Unspecified 09/22/2012, 09/25/2016   PFIZER Comirnaty(Gray Top)Covid-19 Tri-Sucrose Vaccine 04/10/2021   PFIZER(Purple Top)SARS-COV-2 Vaccination 01/21/2020, 02/15/2020   Pneumococcal Conjugate-13 01/19/2014   Pneumococcal Polysaccharide-23 03/24/2015   Td 07/04/2004, 09/25/2010   Td (Adult),unspecified 07/04/2004   Tdap 09/25/2010, 04/02/2021   Typhoid Inactivated 02/28/2011   Zoster Recombinat (Shingrix) 04/02/2021   Zoster, Live 09/17/2011    TDAP status: Up to date  Flu Vaccine status: Up to date  Pneumococcal vaccine status: Up to date  Covid-19 vaccine status: Completed vaccines  Qualifies for Shingles Vaccine? Yes   Zostavax completed Yes   Shingrix Completed?: Yes  Screening Tests Health Maintenance  Topic Date Due   Diabetic kidney evaluation - Urine ACR  Never done   Zoster Vaccines- Shingrix (2 of 2) 05/28/2021   COVID-19 Vaccine (4 - 2023-24 season) 07/26/2022   INFLUENZA VACCINE  06/26/2023    Diabetic kidney evaluation - eGFR measurement  12/26/2023   Medicare Annual Wellness (AWV)  05/06/2024   Colonoscopy  04/13/2025   DTaP/Tdap/Td (6 - Td or Tdap) 04/03/2031   Pneumonia Vaccine 52+ Years old  Completed   Hepatitis C Screening  Completed   HPV VACCINES  Aged Out    Health Maintenance  Health Maintenance Due  Topic Date Due   Diabetic kidney evaluation - Urine ACR  Never done   Zoster Vaccines- Shingrix (2 of 2) 05/28/2021   COVID-19 Vaccine (4 - 2023-24 season) 07/26/2022    Colorectal cancer screening: Type of screening: Colonoscopy. Completed yes. Repeat every 10 years  Lung Cancer Screening: (Low Dose CT Chest recommended if Age 16-80 years, 30 pack-year currently smoking OR have quit w/in 15years.) does not qualify.   Lung Cancer Screening Referral: no  Additional Screening:  Hepatitis C Screening: does not qualify; Completed yes  Vision Screening: Recommended annual ophthalmology exams for early detection of glaucoma and other disorders of the eye. Is the patient up to date with their annual eye exam?  Yes  Who is the provider or what is the name of the office in which the patient attends annual eye exams? [facility] If pt is not established with a provider, would they like to be referred to a provider to establish care? No .  Dental Screening: Recommended annual dental exams for proper oral hygiene  Community Resource Referral / Chronic Care Management: CRR required this visit?  No   CCM required this visit?  No      Plan:     I have personally reviewed and noted the following in the patient's chart:   Medical and social history Use of alcohol, tobacco or illicit drugs  Current medications and supplements including opioid prescriptions. Patient is not currently taking opioid prescriptions. Functional ability and status Nutritional status Physical activity Advanced directives List of other physicians Hospitalizations, surgeries,  and ER visits in previous 12 months Vitals Screenings to include cognitive, depression, and falls Referrals and appointments  In addition, I have reviewed and discussed with patient certain preventive protocols, quality metrics, and best practice recommendations. A written personalized care plan for preventive services as well as general preventive health recommendations were provided to patient.    Sue Lush, LPN   1/61/0960   Nurse Notes: The patient states he is doing well and has no concerns or questions at this time.

## 2023-05-15 ENCOUNTER — Emergency Department: Payer: No Typology Code available for payment source

## 2023-05-15 ENCOUNTER — Other Ambulatory Visit: Payer: Self-pay

## 2023-05-15 ENCOUNTER — Emergency Department
Admission: EM | Admit: 2023-05-15 | Discharge: 2023-05-15 | Disposition: A | Discharge status: Home / Self Care | Payer: No Typology Code available for payment source | Attending: Emergency Medicine | Admitting: Emergency Medicine

## 2023-05-15 DIAGNOSIS — R911 Solitary pulmonary nodule: Secondary | ICD-10-CM | POA: Insufficient documentation

## 2023-05-15 DIAGNOSIS — S50311A Abrasion of right elbow, initial encounter: Secondary | ICD-10-CM | POA: Diagnosis not present

## 2023-05-15 DIAGNOSIS — Z23 Encounter for immunization: Secondary | ICD-10-CM | POA: Insufficient documentation

## 2023-05-15 DIAGNOSIS — Y92410 Unspecified street and highway as the place of occurrence of the external cause: Secondary | ICD-10-CM | POA: Insufficient documentation

## 2023-05-15 DIAGNOSIS — R55 Syncope and collapse: Secondary | ICD-10-CM | POA: Diagnosis not present

## 2023-05-15 DIAGNOSIS — S8002XA Contusion of left knee, initial encounter: Secondary | ICD-10-CM | POA: Insufficient documentation

## 2023-05-15 DIAGNOSIS — S81011A Laceration without foreign body, right knee, initial encounter: Secondary | ICD-10-CM | POA: Diagnosis not present

## 2023-05-15 DIAGNOSIS — S8991XA Unspecified injury of right lower leg, initial encounter: Secondary | ICD-10-CM | POA: Diagnosis present

## 2023-05-15 DIAGNOSIS — R918 Other nonspecific abnormal finding of lung field: Secondary | ICD-10-CM | POA: Diagnosis not present

## 2023-05-15 DIAGNOSIS — I700 Atherosclerosis of aorta: Secondary | ICD-10-CM | POA: Insufficient documentation

## 2023-05-15 DIAGNOSIS — R0789 Other chest pain: Secondary | ICD-10-CM | POA: Diagnosis not present

## 2023-05-15 LAB — CBC WITH DIFFERENTIAL/PLATELET
Abs Immature Granulocytes: 0.07 10*3/uL (ref 0.00–0.07)
Basophils Absolute: 0.1 10*3/uL (ref 0.0–0.1)
Basophils Relative: 0 %
Eosinophils Absolute: 0.1 10*3/uL (ref 0.0–0.5)
Eosinophils Relative: 1 %
HCT: 43 % (ref 39.0–52.0)
Hemoglobin: 14.6 g/dL (ref 13.0–17.0)
Immature Granulocytes: 1 %
Lymphocytes Relative: 8 %
Lymphs Abs: 1.1 10*3/uL (ref 0.7–4.0)
MCH: 28.9 pg (ref 26.0–34.0)
MCHC: 34 g/dL (ref 30.0–36.0)
MCV: 85.1 fL (ref 80.0–100.0)
Monocytes Absolute: 0.8 10*3/uL (ref 0.1–1.0)
Monocytes Relative: 6 %
Neutro Abs: 11.4 10*3/uL — ABNORMAL HIGH (ref 1.7–7.7)
Neutrophils Relative %: 84 %
Platelets: 192 10*3/uL (ref 150–400)
RBC: 5.05 MIL/uL (ref 4.22–5.81)
RDW: 13.8 % (ref 11.5–15.5)
WBC: 13.4 10*3/uL — ABNORMAL HIGH (ref 4.0–10.5)
nRBC: 0 % (ref 0.0–0.2)

## 2023-05-15 LAB — COMPREHENSIVE METABOLIC PANEL
ALT: 20 U/L (ref 0–44)
AST: 35 U/L (ref 15–41)
Albumin: 4.4 g/dL (ref 3.5–5.0)
Alkaline Phosphatase: 151 U/L — ABNORMAL HIGH (ref 38–126)
Anion gap: 10 (ref 5–15)
BUN: 20 mg/dL (ref 8–23)
CO2: 25 mmol/L (ref 22–32)
Calcium: 9 mg/dL (ref 8.9–10.3)
Chloride: 105 mmol/L (ref 98–111)
Creatinine, Ser: 0.84 mg/dL (ref 0.61–1.24)
GFR, Estimated: >60 mL/min (ref >60)
Glucose, Bld: 120 mg/dL — ABNORMAL HIGH (ref 70–99)
Potassium: 3.2 mmol/L — ABNORMAL LOW (ref 3.5–5.1)
Sodium: 140 mmol/L (ref 135–145)
Total Bilirubin: 1.4 mg/dL — ABNORMAL HIGH (ref 0.3–1.2)
Total Protein: 7.6 g/dL (ref 6.5–8.1)

## 2023-05-15 LAB — TROPONIN I (HIGH SENSITIVITY)
Troponin I (High Sensitivity): 5 ng/L (ref <18)
Troponin I (High Sensitivity): 4 ng/L (ref <18)

## 2023-05-15 LAB — LIPASE, BLOOD: Lipase: 41 U/L (ref 11–51)

## 2023-05-15 LAB — PROTIME-INR
INR: 1 (ref 0.8–1.2)
Prothrombin Time: 13.5 seconds (ref 11.4–15.2)

## 2023-05-15 MED ORDER — IBUPROFEN 600 MG PO TABS: 600.0000 mg | ORAL_TABLET | Freq: Three times a day (TID) | ORAL | 0 refills | Status: AC | PRN

## 2023-05-15 MED ORDER — IOHEXOL 300 MG/ML  SOLN
100.0000 mL | Freq: Once | INTRAMUSCULAR | Status: AC | PRN
  Administered 2023-05-15: 100 mL via INTRAVENOUS

## 2023-05-15 MED ORDER — HYDROCODONE-ACETAMINOPHEN 5-325 MG PO TABS
2.0000 | ORAL_TABLET | Freq: Once | ORAL | Status: DC
  Filled 2023-05-15: qty 2

## 2023-05-15 MED ORDER — CEPHALEXIN 500 MG PO CAPS: 500.0000 mg | ORAL_CAPSULE | Freq: Three times a day (TID) | ORAL | 0 refills | Status: AC

## 2023-05-15 MED ORDER — LIDOCAINE-EPINEPHRINE 2 %-1:100000 IJ SOLN
20.0000 mL | Freq: Once | INTRAMUSCULAR | Status: AC
  Administered 2023-05-15: 20 mL via INTRADERMAL
  Filled 2023-05-15: qty 1

## 2023-05-15 MED ORDER — TETANUS-DIPHTH-ACELL PERTUSSIS 5-2.5-18.5 LF-MCG/0.5 IM SUSY
0.5000 mL | PREFILLED_SYRINGE | Freq: Once | INTRAMUSCULAR | Status: AC
  Administered 2023-05-15: 0.5 mL via INTRAMUSCULAR
  Filled 2023-05-15: qty 0.5

## 2023-05-15 MED ORDER — ONDANSETRON HCL 4 MG/2ML IJ SOLN
4.0000 mg | Freq: Once | INTRAMUSCULAR | Status: AC
  Administered 2023-05-15: 4 mg via INTRAVENOUS
  Filled 2023-05-15: qty 2
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
## 2023-05-15 NOTE — ED Notes (Signed)
Bandaging applied as requested by provider. Pt A&O x4, dressing applied to right elbow and right knee. No obvious distress noted.

## 2023-05-15 NOTE — Discharge Instructions (Signed)
For your knee:  Take the antibiotic as prescribed Take Ibuprofen for pain Wear an ACE wrap and minimize bending of the knee until healed Suture removal in 10 days  For your CT findings: Incidental note was made of small nodules on CT of  chest - likely these are benign, but recommend discussing with your doctor as you may benefit from a repeat CT scan in 3-6 months depending on risk factors
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
## 2023-05-27 ENCOUNTER — Encounter: Payer: Self-pay | Admitting: Family Medicine

## 2023-05-27 ENCOUNTER — Ambulatory Visit (INDEPENDENT_AMBULATORY_CARE_PROVIDER_SITE_OTHER): Payer: Medicare Other | Admitting: Family Medicine

## 2023-05-27 VITALS — BP 128/64 | HR 63 | Temp 97.2°F | Resp 12 | Ht 70.0 in | Wt 209.0 lb

## 2023-05-27 DIAGNOSIS — E785 Hyperlipidemia, unspecified: Secondary | ICD-10-CM

## 2023-05-27 MED ORDER — SILVER SULFADIAZINE 1 % EX CREA: 1.0000 | TOPICAL_CREAM | CUTANEOUS | 0 refills | Status: DC
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
## 2023-08-31 ENCOUNTER — Ambulatory Visit
Admission: EM | Admit: 2023-08-31 | Discharge: 2023-08-31 | Disposition: A | Discharge status: Home / Self Care | Payer: Medicare Other | Attending: Emergency Medicine | Admitting: Emergency Medicine

## 2023-08-31 DIAGNOSIS — J069 Acute upper respiratory infection, unspecified: Secondary | ICD-10-CM | POA: Diagnosis not present

## 2023-08-31 DIAGNOSIS — J302 Other seasonal allergic rhinitis: Secondary | ICD-10-CM | POA: Diagnosis not present

## 2023-08-31 MED ORDER — IPRATROPIUM BROMIDE 0.06 % NA SOLN: 2.0000 | Freq: Three times a day (TID) | NASAL | 12 refills | Status: AC

## 2023-08-31 MED ORDER — PREDNISONE 10 MG (21) PO TBPK: ORAL_TABLET | Freq: Every day | ORAL | 0 refills | Status: DC

## 2023-08-31 NOTE — Discharge Instructions (Addendum)
Please stop using the Afrin nasal spray.    Use the ipratropium nasal spray and take the prednisone as directed.    Take plain Mucinex as directed.    Follow-up with your primary care provider.

## 2023-08-31 NOTE — ED Provider Notes (Signed)
James Compton    CSN: [id number] Arrival date & time: 08/31/23  0841      History   Chief Complaint Chief Complaint  Patient presents with   Nasal Congestion    HPI James Compton is a 72 y.o. male.  Patient presents with 2-3 day history of sinus pressure, congestion, sinus pain, runny nose, ear pain, mild cough.  He has been treating his symptoms with Afrin nasal spray and saline nasal spray.  No fever, shortness of breath, or other symptoms.  Negative COVID test at home.  His medical history includes hypertension, hyperlipidemia, TIA, GERD.  The history is provided by the patient and medical records.    Past Medical History:  Diagnosis Date   Actinic keratosis    Arthritis    Osteo - knees   Complication of anesthesia    bowels "feel asleep" after knee surgery   GERD (gastroesophageal reflux disease)    occas   Hypertension    Pneumonia    PONV (postoperative nausea and vomiting)    Sleep apnea    Status post arthroscopy 04/11/2017   Right Knee May 2018 Dr. Ernest Pine   Stroke [facility])    TIA 2017   TIA (transient ischemic attack) 11/03/2016   Vertigo 01/2011   patient reports he ate lots of candy that day   Wears hearing aid    bilateral   Wears partial dentures    upper    Patient Active Problem List   Diagnosis Date Noted   Primary osteoarthritis of left knee 07/07/2022   Chronic pain of left knee 04/19/2022   Total knee replacement status 11/30/2018   Plantar fasciitis, right 07/02/2018   OSA (obstructive sleep apnea) 12/09/2017   Essential hypertension 11/04/2016   Hyperlipidemia 11/04/2016   Obesity 11/04/2016   History of TIA (transient ischemic attack) 11/03/2016   Aortic ectasia, abdominal (HCC) 10/21/2016    Past Surgical History:  Procedure Laterality Date   CHOLECYSTECTOMY  11/25/2005   COLONOSCOPY N/A 04/14/2015   Procedure: COLONOSCOPY;  Surgeon: Midge Minium, MD;  Location: [facility];  Service: Gastroenterology;  Laterality:  N/A;   EYE SURGERY     cataract both eyes   KNEE ARTHROPLASTY Right 11/30/2018   Procedure: COMPUTER ASSISTED TOTAL KNEE ARTHROPLASTY;  Surgeon: Donato Heinz, MD;  Location: [facility];  Service: Orthopedics;  Laterality: Right;   KNEE ARTHROPLASTY Left 08/26/2022   Procedure: COMPUTER ASSISTED TOTAL KNEE ARTHROPLASTY;  Surgeon: Donato Heinz, MD;  Location: [facility];  Service: Orthopedics;  Laterality: Left;   KNEE ARTHROSCOPY Right 11/25/2005   KNEE ARTHROSCOPY WITH MEDIAL MENISECTOMY  04/02/2017   Procedure: KNEE ARTHROSCOPY WITH MEDIAL MENISECTOMY CHONDRAPLASTY, SYNOVECTOMY;  Surgeon: Donato Heinz, MD;  Location: [facility];  Service: Orthopedics;;   POLYPECTOMY  04/14/2015   Procedure: POLYPECTOMY INTESTINAL;  Surgeon: Midge Minium, MD;  Location: [facility];  Service: Gastroenterology;;       Home Medications    Prior to Admission medications   Medication Sig Start Date End Date Taking? Authorizing Provider  ipratropium (ATROVENT) 0.06 % nasal spray Place 2 sprays into both nostrils 3 (three) times daily. 08/31/23  Yes Mickie Bail, NP  predniSONE (STERAPRED UNI-PAK 21 TAB) 10 MG (21) TBPK tablet Take by mouth daily. As directed 08/31/23  Yes Mickie Bail, NP  acetaminophen (TYLENOL) 500 MG tablet Take 1,000 mg by mouth every 8 (eight) hours as needed (pain).    [provider]  cetirizine (ZYRTEC) 10 MG  tablet  02/07/20   [provider]  Coenzyme Q10 200 MG capsule Take 1 capsule by mouth 1 day or 1 dose.    [provider]  gentamicin ointment (GARAMYCIN) 0.1 % Apply 1 application topically 2 (two) times daily. Apply to affected area 3 times a day 10/31/16   [provider]  ibuprofen (ADVIL) 600 MG tablet Take 1 tablet (600 mg total) by mouth every 8 (eight) hours as needed for moderate pain. 05/15/23   Shaune Pollack, MD  losartan-hydrochlorothiazide (HYZAAR) 50-12.5 MG tablet Take 1 tablet by mouth daily. 12/30/22   Malva Limes,  MD  Multiple Vitamin (MULTIVITAMIN) capsule Take 1 capsule by mouth daily. PM    [provider]  omeprazole (PRILOSEC) 20 MG capsule Take 1 capsule (20 mg total) by mouth daily. 01/10/22   Wyline Mood, MD  Probiotic Product (PROBIOTIC DAILY PO) Take 2 capsules by mouth daily. PM    [provider]  rosuvastatin (CRESTOR) 20 MG tablet TAKE 1 TABLET BY MOUTH EVERY DAY 02/17/23   Malva Limes, MD  silver sulfADIAZINE (SILVADENE) 1 % cream Apply 1 Application topically every other day. 05/27/23   Malva Limes, MD    Family History Family History  Problem Relation Age of Onset   Alzheimer's disease Mother    Diabetes Father        pre diabetic   Healthy Sister    Healthy Brother    Healthy Daughter    Healthy Son    Healthy Daughter     Social History Social History   Tobacco Use   Smoking status: Former    Current packs/day: 0.00    Types: Cigarettes    Quit date: 11/25/1970    Years since quitting: 52.8   Smokeless tobacco: Never  Vaping Use   Vaping status: Never Used  Substance Use Topics   Alcohol use: No   Drug use: No     Allergies   Dipyridamole and Penicillins   Review of Systems Review of Systems  Constitutional:  Negative for chills and fever.  HENT:  Positive for congestion, ear pain, postnasal drip, rhinorrhea and sinus pressure. Negative for sore throat.   Respiratory:  Positive for cough. Negative for shortness of breath.   Cardiovascular:  Negative for chest pain and palpitations.     Physical Exam Triage Vital Signs ED Triage Vitals  Encounter Vitals Group     BP 08/31/23 0934 (!) 146/76     Systolic BP Percentile --      Diastolic BP Percentile --      Pulse Rate 08/31/23 0925 92     Resp 08/31/23 0925 18     Temp 08/31/23 0925 98.6 F (37 C)     Temp src --      SpO2 08/31/23 0925 96 %     Weight --      Height --      Head Circumference --      Peak Flow --      Pain Score 08/31/23 0932 6     Pain Loc --       Pain Education --      Exclude from Growth Chart --    No data found.  Updated Vital Signs BP (!) 146/76   Pulse 92   Temp 98.6 F (37 C)   Resp 18   SpO2 96%   Visual Acuity Right Eye Distance:   Left Eye Distance:   Bilateral Distance:  Right Eye Near:   Left Eye Near:    Bilateral Near:     Physical Exam Constitutional:      General: He is not in acute distress. HENT:     Right Ear: Tympanic membrane normal.     Left Ear: Tympanic membrane normal.     Nose: Congestion and rhinorrhea present.     Mouth/Throat:     Mouth: Mucous membranes are moist.     Pharynx: Oropharynx is clear.  Cardiovascular:     Rate and Rhythm: Normal rate and regular rhythm.     Heart sounds: Normal heart sounds.  Pulmonary:     Effort: Pulmonary effort is normal. No respiratory distress.     Breath sounds: Normal breath sounds.  Skin:    General: Skin is warm and dry.  Neurological:     Mental Status: He is alert.  Psychiatric:        Mood and Affect: Mood normal.        Behavior: Behavior normal.      UC Treatments / Results  Labs (all labs ordered are listed, but only abnormal results are displayed) Labs Reviewed - No data to display  EKG   Radiology No results found.  Procedures Procedures (including critical care time)  Medications Ordered in UC Medications - No data to display  Initial Impression / Assessment and Plan / UC Course  I have reviewed the triage vital signs and the nursing notes.  Pertinent labs & imaging results that were available during my care of the patient were reviewed by me and considered in my medical decision making (see chart for details).    Viral URI, seasonal allergies.  Afebrile and vital signs are stable.  Patient has been symptomatic for 2 to 3 days.  He has been using Afrin nasal spray; encouraged him to discontinue using this.  Treating today with ipratropium nasal spray and prednisone taper.  Instructed patient to take plain  Mucinex also.  Education provided on viral respiratory and allergic rhinitis.  Instructed him to follow-up with his PCP.  He agrees to plan of care.  Final Clinical Impressions(s) / UC Diagnoses   Final diagnoses:  Viral URI  Seasonal allergies     Discharge Instructions      Please stop using the Afrin nasal spray.    Use the ipratropium nasal spray and take the prednisone as directed.    Take plain Mucinex as directed.    Follow-up with your primary care provider.     ED Prescriptions     Medication Sig Dispense Auth. Provider   ipratropium (ATROVENT) 0.06 % nasal spray Place 2 sprays into both nostrils 3 (three) times daily. 15 mL Mickie Bail, NP   predniSONE (STERAPRED UNI-PAK 21 TAB) 10 MG (21) TBPK tablet Take by mouth daily. As directed 21 tablet Mickie Bail, NP      PDMP not reviewed this encounter.   Mickie Bail, NP 08/31/23 1000

## 2023-08-31 NOTE — ED Triage Notes (Addendum)
Patient to Urgent Care with complaints of nasal congestion. Reports sinus pain and pressure/ teeth pain. Ear congestion. Denies any known fevers.   Symptoms started Wednesday. Worsened yesterday.  Has been using a saline nasal spray/ afrin. Negative home Covid test.

## 2023-09-17 ENCOUNTER — Ambulatory Visit: Payer: Medicare Other | Admitting: Dermatology

## 2023-09-17 DIAGNOSIS — Z7189 Other specified counseling: Secondary | ICD-10-CM

## 2023-09-17 DIAGNOSIS — L578 Other skin changes due to chronic exposure to nonionizing radiation: Secondary | ICD-10-CM | POA: Diagnosis not present

## 2023-09-17 DIAGNOSIS — Z1283 Encounter for screening for malignant neoplasm of skin: Secondary | ICD-10-CM | POA: Diagnosis not present

## 2023-09-17 DIAGNOSIS — L814 Other melanin hyperpigmentation: Secondary | ICD-10-CM

## 2023-09-17 DIAGNOSIS — L570 Actinic keratosis: Secondary | ICD-10-CM | POA: Diagnosis not present

## 2023-09-17 DIAGNOSIS — Z5111 Encounter for antineoplastic chemotherapy: Secondary | ICD-10-CM

## 2023-09-17 DIAGNOSIS — D1801 Hemangioma of skin and subcutaneous tissue: Secondary | ICD-10-CM

## 2023-09-17 DIAGNOSIS — W908XXA Exposure to other nonionizing radiation, initial encounter: Secondary | ICD-10-CM | POA: Diagnosis not present

## 2023-09-17 DIAGNOSIS — Z79899 Other long term (current) drug therapy: Secondary | ICD-10-CM

## 2023-09-17 DIAGNOSIS — Z872 Personal history of diseases of the skin and subcutaneous tissue: Secondary | ICD-10-CM

## 2023-09-17 DIAGNOSIS — L820 Inflamed seborrheic keratosis: Secondary | ICD-10-CM

## 2023-09-17 DIAGNOSIS — D229 Melanocytic nevi, unspecified: Secondary | ICD-10-CM

## 2023-09-17 DIAGNOSIS — L821 Other seborrheic keratosis: Secondary | ICD-10-CM

## 2023-09-17 NOTE — Patient Instructions (Addendum)
Start cream in 1 month to cheeks and temples   - Start 5-fluorouracil/calcipotriene cream twice a day for 7 days to affected areas including cheeks and temples  . Prescription sent to [facility]. Patient advised they will receive an email to purchase the medication online and have it sent to their home. Patient provided with handout reviewing treatment course and side effects and advised to call or message [place] on MyChart with any concerns.  Reviewed course of treatment and expected reaction.  Patient advised to expect inflammation and crusting and advised that erosions are possible.  Patient advised to be diligent with sun protection during and after treatment. Counseled to keep medication out of reach of children and pets.  Instructions for [facility]ations  One or more of your medications was sent to the [facility] mail order [facility] pharmacy. You will receive an email from them and can purchase the medicine through that link. It will then be mailed to your home at the address you confirmed. If for any reason you do not receive an email from them, please check your spam folder. If you still do not find the email, please let us know. [facility] phone number is [phone].    5-Fluorouracil/Calcipotriene Patient Education   Actinic keratoses are the dry, red scaly spots on the skin caused by sun damage. A portion of these spots can turn into skin cancer with time, and treating them can help prevent development of skin cancer.   Treatment of these spots requires removal of the defective skin cells. There are various ways to remove actinic keratoses, including freezing with liquid nitrogen, treatment with creams, or treatment with a blue light procedure in the office.   5-fluorouracil cream is a topical cream used to treat actinic keratoses. It works by interfering with the growth of abnormal fast-growing skin cells, such as actinic keratoses. These  cells peel off and are replaced by healthy ones.   5-fluorouracil/calcipotriene is a combination of the 5-fluorouracil cream with a vitamin D analog cream called calcipotriene. The calcipotriene alone does not treat actinic keratoses. However, when it is combined with 5-fluorouracil, it helps the 5-fluorouracil treat the actinic keratoses much faster so that the same results can be achieved with a much shorter treatment time.  INSTRUCTIONS FOR 5-FLUOROURACIL/CALCIPOTRIENE CREAM:   5-fluorouracil/calcipotriene cream typically only needs to be used for 4-7 days. A thin layer should be applied twice a day to the treatment areas recommended by your physician.   If your physician prescribed you separate tubes of 5-fluourouracil and calcipotriene, apply a thin layer of 5-fluorouracil followed by a thin layer of calcipotriene.   Avoid contact with your eyes, nostrils, and mouth. Do not use 5-fluorouracil/calcipotriene cream on infected or open wounds.   You will develop redness, irritation and some crusting at areas where you have pre-cancer damage/actinic keratoses. IF YOU DEVELOP PAIN, BLEEDING, OR SIGNIFICANT CRUSTING, STOP THE TREATMENT EARLY - you have already gotten a good response and the actinic keratoses should clear up well.  Wash your hands after applying 5-fluorouracil 5% cream on your skin.   A moisturizer or sunscreen with a minimum SPF 30 should be applied each morning.   Once you have finished the treatment, you can apply a thin layer of Vaseline twice a day to irritated areas to soothe and calm the areas more quickly. If you experience significant discomfort, contact your physician.  For some patients it is necessary to repeat the treatment for best results.  SIDE  EFFECTS: When using 5-fluorouracil/calcipotriene cream, you may have mild irritation, such as redness, dryness, swelling, or a mild burning sensation. This usually resolves within 2 weeks. The more actinic keratoses you  have, the more redness and inflammation you can expect during treatment. Eye irritation has been reported rarely. If this occurs, please let us know.  If you have any trouble using this cream, please call the office. If you have any other questions about this information, please do not hesitate to ask me before you leave the office.    Actinic keratoses are precancerous spots that appear secondary to cumulative UV radiation exposure/sun exposure over time. They are chronic with expected duration over 1 year. A portion of actinic keratoses will progress to squamous cell carcinoma of the skin. It is not possible to reliably predict which spots will progress to skin cancer and so treatment is recommended to prevent development of skin cancer.  Recommend daily broad spectrum sunscreen SPF 30+ to sun-exposed areas, reapply every 2 hours as needed.  Recommend staying in the shade or wearing long sleeves, sun glasses (UVA+UVB protection) and wide brim hats (4-inch brim around the entire circumference of the hat). Call for new or changing lesions.    Cryotherapy Aftercare  Wash gently with soap and water everyday.   Apply Vaseline and Band-Aid daily until healed.   Seborrheic Keratosis  What causes seborrheic keratoses? Seborrheic keratoses are harmless, common skin growths that first appear during adult life.  As time goes by, more growths appear.  Some people may develop a large number of them.  Seborrheic keratoses appear on both covered and uncovered body parts.  They are not caused by sunlight.  The tendency to develop seborrheic keratoses can be inherited.  They vary in color from skin-colored to gray, brown, or even black.  They can be either smooth or have a rough, warty surface.   Seborrheic keratoses are superficial and look as if they were stuck on the skin.  Under the microscope this type of keratosis looks like layers upon layers of skin.  That is why at times the top layer may seem to fall  off, but the rest of the growth remains and re-grows.    Treatment Seborrheic keratoses do not need to be treated, but can easily be removed in the office.  Seborrheic keratoses often cause symptoms when they rub on clothing or jewelry.  Lesions can be in the way of shaving.  If they become inflamed, they can cause itching, soreness, or burning.  Removal of a seborrheic keratosis can be accomplished by freezing, burning, or surgery. If any spot bleeds, scabs, or grows rapidly, please return to have it checked, as these can be an indication of a skin cancer.    Melanoma ABCDEs  Melanoma is the most dangerous type of skin cancer, and is the leading cause of death from skin disease.  You are more likely to develop melanoma if you: Have light-colored skin, light-colored eyes, or red or blond hair Spend a lot of time in the sun Tan regularly, either outdoors or in a tanning bed Have had blistering sunburns, especially during childhood Have a close family member who has had a melanoma Have atypical moles or large birthmarks  Early detection of melanoma is key since treatment is typically straightforward and cure rates are extremely high if we catch it early.   The first sign of melanoma is often a change in a mole or a new dark spot.  The ABCDE system  is a way of remembering the signs of melanoma.  A for asymmetry:  The two halves do not match. B for border:  The edges of the growth are irregular. C for color:  A mixture of colors are present instead of an even brown color. D for diameter:  Melanomas are usually (but not always) greater than 6mm - the size of a pencil eraser. E for evolution:  The spot keeps changing in size, shape, and color.  Please check your skin once per month between visits. You can use a small mirror in front and a large mirror behind you to keep an eye on the back side or your body.   If you see any new or changing lesions before your next follow-up, please call to  schedule a visit.  Please continue daily skin protection including broad spectrum sunscreen SPF 30+ to sun-exposed areas, reapplying every 2 hours as needed when you're outdoors.   Staying in the shade or wearing long sleeves, sun glasses (UVA+UVB protection) and wide brim hats (4-inch brim around the entire circumference of the hat) are also recommended for sun protection.    Due to recent changes in healthcare laws, you may see results of your pathology and/or laboratory studies on MyChart before the doctors have had a chance to review them. We understand that in some cases there may be results that are confusing or concerning to you. Please understand that not all results are received at the same time and often the doctors may need to interpret multiple results in order to provide you with the best plan of care or course of treatment. Therefore, we ask that you please give [place] 2 business days to thoroughly review all your results before contacting the office for clarification. Should we see a critical lab result, you will be contacted sooner.   If You Need Anything After Your Visit  If you have any questions or concerns for your doctor, please call our main line at [phone] and press option 4 to reach your doctor's medical assistant. If no one answers, please leave a voicemail as directed and we will return your call as soon as possible. Messages left after 4 pm will be answered the following business day.   You may also send [place] a message via MyChart. We typically respond to MyChart messages within 1-2 business days.  For prescription refills, please ask your pharmacy to contact our office. Our fax number is [phone].  If you have an urgent issue when the clinic is closed that cannot wait until the next business day, you can page your doctor at the number below.    Please note that while we do our best to be available for urgent issues outside of office hours, we are not available 24/7.    If you have an urgent issue and are unable to reach [place], you may choose to seek medical care at your doctor's office, retail clinic, urgent care center, or emergency room.  If you have a medical emergency, please immediately call 911 or go to the emergency department.  Pager Numbers  - Dr. Gwen Pounds: [phone]  - Dr. Roseanne Reno: [phone]  - Dr. Katrinka Blazing: [phone]   In the event of inclement weather, please call our main line at [phone] for an update on the status of any delays or closures.  Dermatology Medication Tips: Please keep the boxes that topical medications come in in order to help keep track of the instructions about where and how to use these.  Pharmacies typically print the medication instructions only on the boxes and not directly on the medication tubes.   If your medication is too expensive, please contact our office at [phone] option 4 or send [place] a message through MyChart.   We are unable to tell what your co-pay for medications will be in advance as this is different depending on your insurance coverage. However, we may be able to find a substitute medication at lower cost or fill out paperwork to get insurance to cover a needed medication.   If a prior authorization is required to get your medication covered by your insurance company, please allow [place] 1-2 business days to complete this process.  Drug prices often vary depending on where the prescription is filled and some pharmacies may offer cheaper prices.  The website www.goodrx.com contains coupons for medications through different pharmacies. The prices here do not account for what the cost may be with help from insurance (it may be cheaper with your insurance), but the website can give you the price if you did not use any insurance.  - You can print the associated coupon and take it with your prescription to the pharmacy.  - You may also stop by our office during regular business hours and pick up a  GoodRx coupon card.  - If you need your prescription sent electronically to a different pharmacy, notify our office through [facility] or by phone at [phone] option 4.     Si Usted Necesita Algo Despus de Su Visita  Tambin puede enviarnos un mensaje a travs de Clinical cytogeneticist. Por lo general respondemos a los mensajes de MyChart en el transcurso de 1 a 2 das hbiles.  Para renovar recetas, por favor pida a su farmacia que se ponga en contacto con nuestra oficina. Annie Sable de fax es [place] [phone].  Si tiene un asunto urgente cuando la clnica est cerrada y que no puede esperar hasta el siguiente da hbil, puede llamar/localizar a su doctor(a) al nmero que aparece a continuacin.   Por favor, tenga en cuenta que aunque hacemos todo lo posible para estar disponibles para asuntos urgentes fuera del horario de [place], no estamos disponibles las 24 horas del da, los 7 [address]  [address] de la [place].   Si tiene un problema urgente y no puede comunicarse con nosotros, puede optar por buscar atencin mdica  en el consultorio de su doctor(a), en una clnica privada, en un centro de atencin urgente o en una sala de emergencias.  Si tiene Engineer, drilling, por favor llame inmediatamente al 911 o vaya a la sala de emergencias.  Nmeros de bper  - Dr. Gwen Pounds: [phone]  - Dra. Roseanne Reno: [phone]  - Dr. Katrinka Blazing: [phone]   En caso de inclemencias del tiempo, por favor llame a Lacy Duverney principal al [phone] para una actualizacin sobre el [place] de cualquier retraso o cierre.  Consejos para la medicacin en dermatologa: Por favor, guarde las cajas en las que vienen los medicamentos de uso tpico para ayudarle a seguir las instrucciones sobre dnde y cmo usarlos. Las farmacias generalmente imprimen las instrucciones del medicamento slo en las cajas y no directamente en los tubos del [place].   Si su medicamento es muy caro, por favor, pngase en contacto  con Rolm Gala llamando al [phone] y presione la opcin 4 o envenos un mensaje a travs de Clinical cytogeneticist.   No podemos decirle cul ser su copago por los medicamentos por adelantado ya que esto es diferente dependiendo de la  cobertura de su seguro. Sin embargo, es posible que podamos encontrar un medicamento sustituto a Audiological scientist un formulario para que el seguro cubra el medicamento que se considera necesario.   Si se requiere una autorizacin previa para que su compaa de seguros [place] su medicamento, por favor permtanos de 1 a 2 das hbiles para completar [address]  Los precios de los medicamentos varan con frecuencia dependiendo del Environmental consultant de dnde se surte la receta y alguna farmacias pueden ofrecer precios ms baratos.  El sitio web www.goodrx.com tiene cupones para medicamentos de Health and safety inspector. Los precios aqu no tienen en cuenta lo que podra costar con la ayuda del seguro (puede ser ms barato con su seguro), pero el sitio web puede darle el precio si no utiliz Tourist information centre manager.  - Puede imprimir el cupn correspondiente y llevarlo con su receta a la farmacia.  - Tambin puede pasar por nuestra oficina durante el horario de atencin regular y Education officer, museum una tarjeta de cupones de GoodRx.  - Si necesita que su receta se enve electrnicamente a una farmacia diferente, informe a nuestra oficina a travs de MyChart de [place] o por telfono llamando al [phone] y presione la opcin 4.

## 2023-09-17 NOTE — Progress Notes (Signed)
Follow-Up Visit   Subjective  James Compton is a 72 y.o. male who presents for the following: Skin Cancer Screening and Full Body Skin Exam hx of aks,  Spot at inside of left ear comes and goes 3 - 4 months   Scaly spots at face   The patient presents for Total-Body Skin Exam (TBSE) for skin cancer screening and mole check. The patient has spots, moles and lesions to be evaluated, some may be new or changing and the patient may have concern these could be cancer.    The following portions of the chart were reviewed this encounter and updated as appropriate: medications, allergies, medical history  Review of Systems:  No other skin or systemic complaints except as noted in HPI or Assessment and Plan.  Objective  Well appearing patient in no apparent distress; mood and affect are within normal limits.  A full examination was performed including scalp, head, eyes, ears, nose, lips, neck, chest, axillae, abdomen, back, buttocks, bilateral upper extremities, bilateral lower extremities, hands, feet, fingers, toes, fingernails, and toenails. All findings within normal limits unless otherwise noted below.   Relevant physical exam findings are noted in the Assessment and Plan.  face and ears x 12 (12) Erythematous thin papules/macules with gritty scale.   Right inferior temple at   Right Upper Back x 1 Erythematous stuck-on, waxy papule or plaque    Assessment & Plan   SKIN CANCER SCREENING PERFORMED TODAY.  ACTINIC DAMAGE WITH PRECANCEROUS ACTINIC KERATOSES Counseling for Topical Chemotherapy Management: Patient exhibits: - Severe, confluent actinic changes with pre-cancerous actinic keratoses that is secondary to cumulative UV radiation exposure over time - Condition that is severe; chronic, not at goal. - diffuse scaly erythematous macules and papules with underlying dyspigmentation - Discussed Prescription "Field Treatment" topical Chemotherapy for Severe, Chronic Confluent  Actinic Changes with Pre-Cancerous Actinic Keratoses Field treatment involves treatment of an entire area of skin that has confluent Actinic Changes (Sun/ Ultraviolet light damage) and PreCancerous Actinic Keratoses by method of PhotoDynamic Therapy (PDT) and/or prescription Topical Chemotherapy agents such as 5-fluorouracil, 5-fluorouracil/calcipotriene, and/or imiquimod.  The purpose is to decrease the number of clinically evident and subclinical PreCancerous lesions to prevent progression to development of skin cancer by chemically destroying early precancer changes that may or may not be visible.  It has been shown to reduce the risk of developing skin cancer in the treated area. As a result of treatment, redness, scaling, crusting, and open sores may occur during treatment course. One or more than one of these methods may be used and may have to be used several times to control, suppress and eliminate the PreCancerous changes. Discussed treatment course, expected reaction, and possible side effects. - Recommend daily broad spectrum sunscreen SPF 30+ to sun-exposed areas, reapply every 2 hours as needed.  - Staying in the shade or wearing long sleeves, sun glasses (UVA+UVB protection) and wide brim hats (4-inch brim around the entire circumference of the hat) are also recommended. - Call for new or changing lesions.  Start in 1 month - Start 5-fluorouracil/calcipotriene cream twice a day for 7 days to affected areas including temples and cheeks. Prescription sent to [facility]. Patient advised they will receive an email to purchase the medication online and have it sent to their home. Patient provided with handout reviewing treatment course and side effects and advised to call or message [place] on MyChart with any concerns.  Reviewed course of treatment and expected reaction.  Patient  advised to expect inflammation and crusting and advised that erosions are possible.  Patient  advised to be diligent with sun protection during and after treatment. Counseled to keep medication out of reach of children and pets.   LENTIGINES, SEBORRHEIC KERATOSES, HEMANGIOMAS - Benign normal skin lesions - Benign-appearing - Call for any changes  MELANOCYTIC NEVI - Tan-brown and/or pink-flesh-colored symmetric macules and papules - Benign appearing on exam today - Observation - Call clinic for new or changing moles - Recommend daily use of broad spectrum spf 30+ sunscreen to sun-exposed areas.       Actinic keratosis (12) face and ears x 12  Start in 1 month   - Start 5-fluorouracil cream twice a day for 7 days to affected areas including cheeks and temples .  Reviewed course of treatment and expected reaction.  Patient advised to expect inflammation and crusting and advised that erosions are possible.  Patient advised to be diligent with sun protection during and after treatment. Handout with details of how to apply medication and what to expect provided. Counseled to keep medication out of reach of children and pets.    Actinic keratoses are precancerous spots that appear secondary to cumulative UV radiation exposure/sun exposure over time. They are chronic with expected duration over 1 year. A portion of actinic keratoses will progress to squamous cell carcinoma of the skin. It is not possible to reliably predict which spots will progress to skin cancer and so treatment is recommended to prevent development of skin cancer.  Recommend daily broad spectrum sunscreen SPF 30+ to sun-exposed areas, reapply every 2 hours as needed.  Recommend staying in the shade or wearing long sleeves, sun glasses (UVA+UVB protection) and wide brim hats (4-inch brim around the entire circumference of the hat). Call for new or changing lesions.  Destruction of lesion - face and ears x 12 (12) Complexity: simple   Destruction method: cryotherapy   Informed consent: discussed and consent  obtained   Timeout:  patient name, date of birth, surgical site, and procedure verified Lesion destroyed using liquid nitrogen: Yes   Region frozen until ice ball extended beyond lesion: Yes   Outcome: patient tolerated procedure well with no complications   Post-procedure details: wound care instructions given    Inflamed seborrheic keratosis Right Upper Back x 1  Symptomatic, irritating, patient would like treated.  Destruction of lesion - Right Upper Back x 1 Complexity: simple   Destruction method: cryotherapy   Informed consent: discussed and consent obtained   Timeout:  patient name, date of birth, surgical site, and procedure verified Lesion destroyed using liquid nitrogen: Yes   Region frozen until ice ball extended beyond lesion: Yes   Outcome: patient tolerated procedure well with no complications   Post-procedure details: wound care instructions given     No follow-ups on file.  IAsher Muir, CMA, am acting as scribe for Armida Sans, MD.   Documentation: I have reviewed the above documentation for accuracy and completeness, and I agree with the above.  Armida Sans, MD

## 2023-09-28 ENCOUNTER — Encounter: Payer: Self-pay | Admitting: Dermatology

## 2023-11-27 ENCOUNTER — Telehealth: Payer: Self-pay | Admitting: Gastroenterology

## 2023-11-27 NOTE — Telephone Encounter (Signed)
 The patient called in and left a voicemail requesting for [place] to call him back. His wants to schedule an appointment. I called the patient back to let him know we got his message and I schedule the patient for his one year follow up.

## 2024-01-27 ENCOUNTER — Telehealth: Payer: Self-pay

## 2024-01-27 ENCOUNTER — Ambulatory Visit: Payer: Medicare Other | Admitting: Gastroenterology

## 2024-01-27 NOTE — Progress Notes (Deleted)
 Wyline Mood MD, MRCP(U.K) [address]  [address]  [place], [place] [address]  Main: [phone]  Fax: [phone]   Primary Care Physician: Malva Limes, MD  Primary Gastroenterologist:  Dr. Wyline Mood   No chief complaint on file.   HPI: James Compton is a 73 y.o. male  Summary of history : Initially seen and referred for nausea on 07/17/2021.  05/15/2021 right upper quadrant ultrasound showed no abnormalities  May 2022 CMP demonstrated elevated alkaline phosphatase 161 total bilirubin of 1.4 normal AST and ALT hemoglobin normal TSH normal.Last colonoscopy in May 2016 by Dr. Servando Snare.   He states that he has occasional nausea.  Does not follow clear pattern.  Can start all of a sudden.  And resolve all of a sudden.  Denies any abdominal pain.  No new medications.  Denies any heartburn.  He is on Prilosec but do not take it regularly.  He has had abnormal alkaline phosphatase he recollects for more than 4 to 5 years.  No clear etiology was given.   07/17/2021: PTH, calcium was normal.  GGT 48 normal.  Not immune to hepatitis B and ceruloplasmin level was low requiring 24-hour urinary copper and ophthalmology exam to rule out KF rings.  Immune to hepatitis A.  ANA positive smooth muscle antibody negative ceruloplasmin 12 iron studies normal immunoglobulins normal celiac serology negative.  Antimitochondrial antibody negative He states that he has had a recent ophthalmic exam and no abnormalities were noted.  09/11/2021: 24-hour copper urine excretion normal  01/14/2022: Serum copper 68 (low normal), ceruloplasmin 13.7, GGT normal.     He was seen by [facility] liver transplant team in March 2023 appears an MRI of the liver was ordered I cannot see any results of the same     12/25/2022 alkaline phosphatase 158 otherwise his labs are normal.    Interval history 01/09/2023-01/27/2024   05/15/2023: CT scan of the chest abdomen pelvis with contrast shows bilateral pulmonary  nodules. 05/15/2023 alkaline phosphatase 151 total bilirubin 1.4 AST ALT normal. He says that after his last visit he was seen at [facility] [place] had an MRI of the liver and was told everything was fine.  He was told he had fatty liver.  His alkaline phosphatase has been stable at 158 for over a year.   Current Outpatient Medications  Medication Sig Dispense Refill   acetaminophen (TYLENOL) 500 MG tablet Take 1,000 mg by mouth every 8 (eight) hours as needed (pain).     cetirizine (ZYRTEC) 10 MG tablet      Coenzyme Q10 200 MG capsule Take 1 capsule by mouth 1 day or 1 dose.     gentamicin ointment (GARAMYCIN) 0.1 % Apply 1 application topically 2 (two) times daily. Apply to affected area 3 times a day     ibuprofen (ADVIL) 600 MG tablet Take 1 tablet (600 mg total) by mouth every 8 (eight) hours as needed for moderate pain. 20 tablet 0   ipratropium (ATROVENT) 0.06 % nasal spray Place 2 sprays into both nostrils 3 (three) times daily. 15 mL 12   losartan-hydrochlorothiazide (HYZAAR) 50-12.5 MG tablet Take 1 tablet by mouth daily. 90 tablet 4   Multiple Vitamin (MULTIVITAMIN) capsule Take 1 capsule by mouth daily. PM     omeprazole (PRILOSEC) 20 MG capsule Take 1 capsule (20 mg total) by mouth daily. 90 capsule 3   predniSONE (STERAPRED UNI-PAK 21 TAB) 10 MG (21) TBPK tablet Take by mouth daily. As directed 21 tablet  0   Probiotic Product (PROBIOTIC DAILY PO) Take 2 capsules by mouth daily. PM     rosuvastatin (CRESTOR) 20 MG tablet TAKE 1 TABLET BY MOUTH EVERY DAY 90 tablet 2   silver sulfADIAZINE (SILVADENE) 1 % cream Apply 1 Application topically every other day. 50 g 0   No current facility-administered medications for this visit.    Allergies as of 01/27/2024 - Review Complete 09/28/2023  Allergen Reaction Noted   Dipyridamole  11/03/2018   Penicillins Nausea Only 04/06/2015       Interval history   ***/***/202*   ***/***/2024   ROS:  General: Negative for anorexia, weight loss, fever,  chills, fatigue, weakness. ENT: Negative for hoarseness, difficulty swallowing , nasal congestion. CV: Negative for chest pain, angina, palpitations, dyspnea on exertion, peripheral edema.  Respiratory: Negative for dyspnea at rest, dyspnea on exertion, cough, sputum, wheezing.  GI: See history of present illness. GU:  Negative for dysuria, hematuria, urinary incontinence, urinary frequency, nocturnal urination.  Endo: Negative for unusual weight change.    Physical Examination:   There were no vitals taken for this visit.  General: Well-nourished, well-developed in no acute distress.  Eyes: No icterus. Conjunctivae pink. Mouth: Oropharyngeal mucosa moist and pink , no lesions erythema or exudate. Lungs: Clear to auscultation bilaterally. Non-labored. Heart: Regular rate and rhythm, no murmurs rubs or gallops.  Abdomen: Bowel sounds are normal, nontender, nondistended, no hepatosplenomegaly or masses, no abdominal bruits or hernia , no rebound or guarding.   Extremities: No lower extremity edema. No clubbing or deformities. Neuro: Alert and oriented x 3.  Grossly intact. Skin: Warm and dry, no jaundice.   Psych: Alert and cooperative, normal mood and affect.   Imaging Studies: No results found.  Assessment and Plan:   James Compton is a 73 y.o. y/o male here to follow-up for episodic nausea and elevated alkaline phosphatase.  The nausea has significantly improved after commencing him on Prilosec 40 mg once a day for acid reflux.  Urinary copper level checked was normal.  He does not have any neuropsychiatric issues concerning for Wilson's disease .  I rechecked his ceruloplasmin levels and it is still very low.  His GGT is normal.  I explained to him that his clinical picture is not fitting that of Wilson's disease.  Was subsequently seen at [facility] and he states that he was told that his liver had only fatty liver and nothing else I cannot see the MRI result which he says he has had and was  told was normal.  We will try and obtain it from his doctor at [facility] and since he has fatty liver we will see him back in 1 years time to see if he would be a candidate for any of the new drugs which may be coming out.     Dr Wyline Mood  MD,MRCP [facility]) Follow up in ***

## 2024-01-27 NOTE — Telephone Encounter (Signed)
 Called patient to let him know that Dr. Tobi Bastos is still waiting for [facility] records in order for him to know if he only has fatty liver or wilson's diease and office notes. I let the patient know that I had called [facility] last year and faxed a release of information for them to send [place] his records and yet did not receive them. Today I called the hepatology clinic and asked to speak with a nurse and had to leave a voicemail to call me back. I let the patient know that once I receive his records, that I would call him back and reschedule his appointment to discuss a plan. Patient understood. Patient also stated that it was not an MRI that he had done in 2023 at [facility] hepatology clinic, but an ultrasound. I told him that I would let the nurse know once I get a call.

## 2024-02-03 NOTE — Telephone Encounter (Signed)
 Called [facility] and had to leave a voicemail to return my call to [place], [place] from   [facility] [place]  [phone].

## 2024-02-11 NOTE — Telephone Encounter (Signed)
 [facility] sent [place] his chart for Dr. Tobi Bastos to review and he wants to see patient in 2 months.

## 2024-02-17 ENCOUNTER — Encounter: Payer: Self-pay | Admitting: Gastroenterology

## 2024-02-17 ENCOUNTER — Ambulatory Visit (INDEPENDENT_AMBULATORY_CARE_PROVIDER_SITE_OTHER): Admitting: Gastroenterology

## 2024-02-17 VITALS — BP 164/97 | HR 74 | Temp 98.3°F | Ht 69.0 in | Wt 210.0 lb

## 2024-02-17 DIAGNOSIS — R748 Abnormal levels of other serum enzymes: Secondary | ICD-10-CM | POA: Diagnosis not present

## 2024-02-17 DIAGNOSIS — K219 Gastro-esophageal reflux disease without esophagitis: Secondary | ICD-10-CM | POA: Diagnosis not present

## 2024-02-17 DIAGNOSIS — R12 Heartburn: Secondary | ICD-10-CM

## 2024-02-17 MED ORDER — OMEPRAZOLE 40 MG PO CPDR: 40.0000 mg | DELAYED_RELEASE_CAPSULE | Freq: Every day | ORAL | 3 refills | Status: AC

## 2024-02-17 NOTE — Progress Notes (Signed)
 Wyline Mood MD, MRCP(U.K) [address]  [address]  [place], [place] [address]  Main: [phone]  Fax: [phone]   Primary Care Physician: Malva Limes, MD  Primary Gastroenterologist:  Dr. Wyline Mood   Chief Complaint  Patient presents with   elevated alkaline phosphatase level    HPI: James Compton is a 73 y.o. male Summary of history : Initially seen and referred for nausea on 07/17/2021.  05/15/2021 right upper quadrant ultrasound showed no abnormalities  May 2022 CMP demonstrated elevated alkaline phosphatase 161 total bilirubin of 1.4 normal AST and ALT hemoglobin normal TSH normal.Last colonoscopy in May 2016 by Dr. Servando Snare.   He states that he has occasional nausea.  Does not follow clear pattern.  Can start all of a sudden.  And resolve all of a sudden.  Denies any abdominal pain.  No new medications.  Denies any heartburn.  He is on Prilosec but do not take it regularly.  He has had abnormal alkaline phosphatase he recollects for more than 4 to 5 years.  No clear etiology was given.   07/17/2021: PTH, calcium was normal.  GGT 48 normal.  Not immune to hepatitis B and ceruloplasmin level was low requiring 24-hour urinary copper and ophthalmology exam to rule out KF rings.  Immune to hepatitis A.  ANA positive smooth muscle antibody negative ceruloplasmin 12 iron studies normal immunoglobulins normal celiac serology negative.  Antimitochondrial antibody negative He states that he has had a recent ophthalmic exam and no abnormalities were noted.  09/11/2021: 24-hour copper urine excretion normal  01/14/2022: Serum copper 68 (low normal), ceruloplasmin 13.7, GGT normal.      12/25/2022 alkaline phosphatase 158 otherwise his labs are normal.   Interval history 01/09/2023-02/17/2024    [facility] fibroscan showed F0-f1 fibrosis in 01/2022. MRI MRCP in April 2023 showed no abnormalities of the liver  He says that the last time we met I decreased his omeprazole from 40 mg to 20  mg a day and since then he has had some recurrence of heartburn.  He was not sure whether he should have an upper endoscopy or increase the dose of his medication.  No other complaints presently.  Current Outpatient Medications  Medication Sig Dispense Refill   acetaminophen (TYLENOL) 500 MG tablet Take 1,000 mg by mouth every 8 (eight) hours as needed (pain).     cetirizine (ZYRTEC) 10 MG tablet      Coenzyme Q10 200 MG capsule Take 1 capsule by mouth 1 day or 1 dose.     gentamicin ointment (GARAMYCIN) 0.1 % Apply 1 application topically 2 (two) times daily. Apply to affected area 3 times a day     ibuprofen (ADVIL) 600 MG tablet Take 1 tablet (600 mg total) by mouth every 8 (eight) hours as needed for moderate pain. 20 tablet 0   ipratropium (ATROVENT) 0.06 % nasal spray Place 2 sprays into both nostrils 3 (three) times daily. 15 mL 12   losartan-hydrochlorothiazide (HYZAAR) 50-12.5 MG tablet Take 1 tablet by mouth daily. 90 tablet 4   Multiple Vitamin (MULTIVITAMIN) capsule Take 1 capsule by mouth daily. PM     omeprazole (PRILOSEC) 20 MG capsule Take 1 capsule (20 mg total) by mouth daily. 90 capsule 3   predniSONE (STERAPRED UNI-PAK 21 TAB) 10 MG (21) TBPK tablet Take by mouth daily. As directed 21 tablet 0   Probiotic Product (PROBIOTIC DAILY PO) Take 2 capsules by mouth daily. PM     rosuvastatin (  CRESTOR) 20 MG tablet TAKE 1 TABLET BY MOUTH EVERY DAY 90 tablet 2   silver sulfADIAZINE (SILVADENE) 1 % cream Apply 1 Application topically every other day. 50 g 0   No current facility-administered medications for this visit.    Allergies as of 02/17/2024 - Review Complete 02/17/2024  Allergen Reaction Noted   Dipyridamole  11/03/2018   Penicillins Nausea Only 04/06/2015      ROS:  General: Negative for anorexia, weight loss, fever, chills, fatigue, weakness. ENT: Negative for hoarseness, difficulty swallowing , nasal congestion. CV: Negative for chest pain, angina, palpitations,  dyspnea on exertion, peripheral edema.  Respiratory: Negative for dyspnea at rest, dyspnea on exertion, cough, sputum, wheezing.  GI: See history of present illness. GU:  Negative for dysuria, hematuria, urinary incontinence, urinary frequency, nocturnal urination.  Endo: Negative for unusual weight change.    Physical Examination:   BP (!) 164/97   Pulse 74   Temp 98.3 F (36.8 C) (Oral)   Ht 5\' 9"  (1.753 m)   Wt 210 lb (95.3 kg)   BMI 31.01 kg/m   General: Well-nourished, well-developed in no acute distress.  Eyes: No icterus. Conjunctivae pink. Mouth: Oropharyngeal mucosa moist and pink , no lesions erythema or exudate. Neuro: Alert and oriented x 3.  Grossly intact. Skin: Warm and dry, no jaundice.   Psych: Alert and cooperative, normal mood and affect.   Imaging Studies: No results found.  Assessment and Plan:   James Compton is a 73 y.o. y/o male here to follow-up for episodic nausea and elevated alkaline phosphatase.  The nausea has significantly improved after commencing him on Prilosec 40 mg once a day for acid reflux.  Urinary copper level checked was normal.  He does not have any neuropsychiatric issues concerning for Wilson's disease .  MRI [facility] of the liver has been normal his GGT is normal.  I explained to him that his clinical picture is not fitting that of Wilson's disease.  FibroScan at [facility] showed M0 F1 fibrosis  Plan 1.  From the liver point of view I will check his LFTs again with GGT if GGT not elevated no further evaluation will be required  2.  Recurrence of heartburn after reducing the dose of omeprazole to 20 mg a day.  I given the option of proceeding directly to upper endoscopy versus increasing the medication to 40 mg a day for a short period of time and see how he feels.  Overall felt it would be best to give him a short trial of Prilosec 40 mg a day if he does not have relief of his symptoms within 2 to 3 weeks then we should proceed with upper endoscopy  if he is doing well then he may need to remain on this dose long-term.  3.  He says that he is coming up soon for his surveillance colonoscopy.  I explained that if he requires an upper endoscopy both can be performed the same time.  I explained to him that I will be leaving the practice at the end of May and moving to [place] [facility].   Dr Wyline Mood  MD,MRCP [facility]) Follow up in 3 months

## 2024-02-17 NOTE — Addendum Note (Signed)
 Addended by: Adela Ports on: 02/17/2024 01:29 PM   Modules accepted: Orders

## 2024-02-18 LAB — HEPATIC FUNCTION PANEL
ALT: 20 IU/L (ref 0–44)
AST: 33 IU/L (ref 0–40)
Albumin: 4.5 g/dL (ref 3.8–4.8)
Alkaline Phosphatase: 184 IU/L — ABNORMAL HIGH (ref 44–121)
Bilirubin Total: 0.9 mg/dL (ref 0.0–1.2)
Bilirubin, Direct: 0.27 mg/dL (ref 0.00–0.40)
Total Protein: 7.1 g/dL (ref 6.0–8.5)

## 2024-02-18 LAB — GAMMA GT: GGT: 47 IU/L (ref 0–65)

## 2024-02-20 ENCOUNTER — Telehealth: Payer: Self-pay

## 2024-02-20 NOTE — Telephone Encounter (Signed)
-----   Message from Wyline Mood sent at 02/18/2024  8:02 AM EDT ----- Kandis Cocking inform the GGT has been checked multiple times and is normal hence the elevated alk phos is not related to the liver   Dr Wyline Mood MD,MRCP [facility]) Gastroenterology/Hepatology Pager: [phone]

## 2024-02-20 NOTE — Telephone Encounter (Signed)
 Called patient back to let him know about his lab results and he stated that he would reach out to his PCP to let him know if there was anything else that he was to do. Patient had no further questions.

## 2024-03-24 ENCOUNTER — Other Ambulatory Visit: Payer: Self-pay | Admitting: Family Medicine

## 2024-03-24 DIAGNOSIS — I10 Essential (primary) hypertension: Secondary | ICD-10-CM

## 2024-04-06 ENCOUNTER — Telehealth: Payer: Self-pay | Admitting: Family Medicine

## 2024-04-06 ENCOUNTER — Ambulatory Visit: Admitting: Dermatology

## 2024-04-06 DIAGNOSIS — D492 Neoplasm of unspecified behavior of bone, soft tissue, and skin: Secondary | ICD-10-CM

## 2024-04-06 DIAGNOSIS — B079 Viral wart, unspecified: Secondary | ICD-10-CM

## 2024-04-06 DIAGNOSIS — L578 Other skin changes due to chronic exposure to nonionizing radiation: Secondary | ICD-10-CM | POA: Diagnosis not present

## 2024-04-06 DIAGNOSIS — W908XXA Exposure to other nonionizing radiation, initial encounter: Secondary | ICD-10-CM | POA: Diagnosis not present

## 2024-04-06 NOTE — Telephone Encounter (Signed)
 Medical request received from [facility] via fax this morning and paperwork placed in provider mailbox. Could take 7-10 business days to be completed and sent back

## 2024-04-06 NOTE — Progress Notes (Signed)
   Follow-Up Visit   Subjective  James Compton is a 73 y.o. male who presents for the following: The patient has a spot on the left temple to to be evaluated, some may be new or changing and the patient may have concern these could be cancer, hx of precancers.   The following portions of the chart were reviewed this encounter and updated as appropriate: medications, allergies, medical history  Review of Systems:  No other skin or systemic complaints except as noted in HPI or Assessment and Plan.  Objective  Well appearing patient in no apparent distress; mood and affect are within normal limits.  A focused examination was performed of the following areas: face  Relevant exam findings are noted in the Assessment and Plan.  left temple at the hairline Pearly crusted papule    Assessment & Plan   NEOPLASM OF SKIN left temple at the hairline Epidermal / dermal shaving  Lesion diameter (cm):  1.2 Informed consent: discussed and consent obtained   Timeout: patient name, date of birth, surgical site, and procedure verified   Procedure prep:  Patient was prepped and draped in usual sterile fashion Prep type:  Isopropyl alcohol  Anesthesia: the lesion was anesthetized in a standard fashion   Anesthetic:  1% lidocaine  w/ epinephrine  1-100,000 buffered w/ 8.4% NaHCO3 Hemostasis achieved with: pressure, aluminum chloride and electrodesiccation   Outcome: patient tolerated procedure well   Post-procedure details: sterile dressing applied and wound care instructions given   Dressing type: bandage and petrolatum    Destruction of lesion  Destruction method: electrodesiccation and curettage   Informed consent: discussed and consent obtained   Timeout:  patient name, date of birth, surgical site, and procedure verified Anesthesia: the lesion was anesthetized in a standard fashion   Anesthetic:  1% lidocaine  w/ epinephrine  1-100,000 buffered w/ 8.4% NaHCO3 Curettage performed in three  different directions: Yes   Electrodesiccation performed over the curetted area: Yes   Curettage cycles:  3 Final wound size (cm):  1.2 Hemostasis achieved with:  electrodesiccation Outcome: patient tolerated procedure well with no complications   Post-procedure details: sterile dressing applied and wound care instructions given   Dressing type: petrolatum   Additional details:  Treated with EDC Specimen 1 - Surgical pathology Differential Diagnosis: R/O BCC  Check Margins: No ACTINIC SKIN DAMAGE    ACTINIC DAMAGE - chronic, secondary to cumulative UV radiation exposure/sun exposure over time - diffuse scaly erythematous macules with underlying dyspigmentation - Recommend daily broad spectrum sunscreen SPF 30+ to sun-exposed areas, reapply every 2 hours as needed.  - Recommend staying in the shade or wearing long sleeves, sun glasses (UVA+UVB protection) and wide brim hats (4-inch brim around the entire circumference of the hat). - Call for new or changing lesions.  Return if symptoms worsen or fail to improve.  IClara Crisp, CMA, am acting as scribe for Celine Collard, MD .   Documentation: I have reviewed the above documentation for accuracy and completeness, and I agree with the above.  Celine Collard, MD

## 2024-04-06 NOTE — Patient Instructions (Addendum)

## 2024-04-07 ENCOUNTER — Encounter: Payer: Self-pay | Admitting: Dermatology

## 2024-04-09 LAB — SURGICAL PATHOLOGY

## 2024-04-12 ENCOUNTER — Ambulatory Visit: Payer: Self-pay | Admitting: Dermatology

## 2024-04-13 NOTE — Telephone Encounter (Addendum)
 Called and discussed bx results with patient. He verbalized understanding and denied further questions.  ----- Message from Celine Collard sent at 04/12/2024  5:45 PM EDT ----- FINAL DIAGNOSIS        1. Skin, left temple at the hairline :       VERRUCA VULGARIS, IRRITATED, CRUSTED   Benign viral wart Already treated May recur Recheck next visit

## 2024-04-14 ENCOUNTER — Telehealth: Payer: Self-pay

## 2024-04-14 NOTE — Telephone Encounter (Signed)
 Copied from CRM [id number]. Topic: Medical Record Request - Provider/Facility Request >> Apr 14, 2024  1:33 PM Ivette P wrote: Reason for CRM: Mylinda Asa called in to verify if faxed was received, advised fax was not received per last crm on 05/13.  Mylinda Asa stated she will refax documents today.    Callback [phone] - secured line.   Fax [phone]

## 2024-04-15 NOTE — Telephone Encounter (Signed)
 I have not seen that I know of.  Not sure what documents they are talking about but don't recall this name and see nothing under media.  There is a telephone encounter stating we received something on 04/06/2024.

## 2024-05-10 ENCOUNTER — Other Ambulatory Visit: Payer: Self-pay | Admitting: Family Medicine

## 2024-05-10 DIAGNOSIS — Z8673 Personal history of transient ischemic attack (TIA), and cerebral infarction without residual deficits: Secondary | ICD-10-CM

## 2024-05-10 DIAGNOSIS — E785 Hyperlipidemia, unspecified: Secondary | ICD-10-CM

## 2024-05-11 ENCOUNTER — Ambulatory Visit (INDEPENDENT_AMBULATORY_CARE_PROVIDER_SITE_OTHER): Payer: Medicare Other

## 2024-05-11 DIAGNOSIS — Z0000 Encounter for general adult medical examination without abnormal findings: Secondary | ICD-10-CM | POA: Diagnosis not present

## 2024-05-11 NOTE — Patient Instructions (Addendum)
 [name] , Thank you for taking time out of your busy schedule to complete your Annual Wellness Visit with me. I enjoyed our conversation and look forward to speaking with you again next year. I, as well as your care team,  appreciate your ongoing commitment to your health goals. Please review the following plan we discussed and let me know if I can assist you in the future.  Follow up Visits: Next Medicare AWV with our clinical staff:   05/17/25 @ 2:30 PM BY PHONE Have you seen your provider in the last 6 months (3 months if uncontrolled diabetes)? Yes  Clinician Recommendations:  Aim for 30 minutes of exercise or brisk walking, 6-8 glasses of water , and 5 servings of fruits and vegetables each day. TAKE CARE & STAY ON PAR:-)      This is a list of the screening recommended for you and due dates:  Health Maintenance  Topic Date Due   Yearly kidney health urinalysis for diabetes  Never done   Zoster (Shingles) Vaccine (2 of 2) 05/28/2021   COVID-19 Vaccine (4 - 2024-25 season) 07/27/2023   Yearly kidney function blood test for diabetes  05/14/2024   Flu Shot  06/25/2024   Colon Cancer Screening  04/13/2025   Medicare Annual Wellness Visit  05/11/2025   DTaP/Tdap/Td vaccine (7 - Td or Tdap) 05/14/2033   Pneumococcal Vaccine for age over 31  Completed   Hepatitis C Screening  Completed   HPV Vaccine  Aged Out   Meningitis B Vaccine  Aged Out    Advanced directives: (ACP Link)Information on Advanced Care Planning can be found at Retail buyer Health Care Directives Advance Health Care Directives. [email or web address]  Advance Care Planning is important because it:  [x]  Makes sure you receive the medical care that is consistent with your values, goals, and preferences  [x]  It provides guidance to your family and loved ones and reduces their decisional burden about whether or not they are making the right decisions based on your wishes.  Follow the link provided in your  after visit summary or read over the paperwork we have mailed to you to help you started getting your Advance Directives in place. If you need assistance in completing these, please reach out to us  so that we can help you!

## 2024-05-11 NOTE — Progress Notes (Signed)
 Subjective:   James Compton is a 73 y.o. who presents for a Medicare Wellness preventive visit.  As a reminder, Annual Wellness Visits don't include a physical exam, and some assessments may be limited, especially if this visit is performed virtually. We may recommend an in-person follow-up visit with your provider if needed.  Visit Complete: Virtual I connected with  James Compton on 05/11/24 by a audio enabled telemedicine application and verified that I am speaking with the correct person using two identifiers.  Patient Location: Home  Provider Location: Office/Clinic  I discussed the limitations of evaluation and management by telemedicine. The patient expressed understanding and agreed to proceed.  Vital Signs: Because this visit was a virtual/telehealth visit, some criteria may be missing or patient reported. Any vitals not documented were not able to be obtained and vitals that have been documented are patient reported.  VideoDeclined- This patient declined Librarian, academic. Therefore the visit was completed with audio only.  Persons Participating in Visit: Patient.  AWV Questionnaire: No: Patient Medicare AWV questionnaire was not completed prior to this visit.  Cardiac Risk Factors include: advanced age (>62men, >15 women);dyslipidemia;hypertension;male gender;obesity (BMI >30kg/m2)     Objective:    There were no vitals filed for this visit. There is no height or weight on file to calculate BMI.     05/11/2024    8:15 AM 08/31/2023    9:32 AM 05/15/2023    4:52 PM 05/07/2023    8:28 AM 08/26/2022    1:13 PM 08/14/2022    1:27 PM 11/22/2019    8:53 AM  Advanced Directives  Does Patient Have a Medical Advance Directive? No No Yes Yes Yes Yes Yes  Type of Surveyor, minerals;Living will Healthcare Power of [place];Living will Healthcare Power of [employer] of [place];Living will  Does patient want  to make changes to medical advance directive?     No - Patient declined    Copy of Healthcare Power of Attorney in Chart?       Yes - validated most recent copy scanned in chart (See row information)  Would patient like information on creating a medical advance directive? No - Patient declined          Current Medications (verified) Outpatient Encounter Medications as of 05/11/2024  Medication Sig   acetaminophen  (TYLENOL ) 500 MG tablet Take 1,000 mg by mouth every 8 (eight) hours as needed (pain).   cetirizine (ZYRTEC) 10 MG tablet    Coenzyme Q10 200 MG capsule Take 1 capsule by mouth 1 day or 1 dose.   gentamicin  ointment (GARAMYCIN ) 0.1 % Apply 1 application topically 2 (two) times daily. Apply to affected area 3 times a day   ibuprofen  (ADVIL ) 600 MG tablet Take 1 tablet (600 mg total) by mouth every 8 (eight) hours as needed for moderate pain.   losartan -hydrochlorothiazide  (HYZAAR) 50-12.5 MG tablet TAKE 1 TABLET BY MOUTH EVERY DAY   Multiple Vitamin (MULTIVITAMIN) capsule Take 1 capsule by mouth daily. PM   omeprazole  (PRILOSEC) 40 MG capsule Take 1 capsule (40 mg total) by mouth daily.   Probiotic Product (PROBIOTIC DAILY PO) Take 2 capsules by mouth daily. PM   rosuvastatin  (CRESTOR ) 20 MG tablet TAKE 1 TABLET BY MOUTH EVERY DAY   silver  sulfADIAZINE  (SILVADENE ) 1 % cream Apply 1 Application topically every other day.   ipratropium (ATROVENT ) 0.06 % nasal spray Place 2 sprays into both nostrils 3 (three)  times daily. (Patient not taking: Reported on 05/11/2024)   predniSONE  (STERAPRED UNI-PAK 21 TAB) 10 MG (21) TBPK tablet Take by mouth daily. As directed (Patient not taking: Reported on 05/11/2024)   No facility-administered encounter medications on file as of 05/11/2024.    Allergies (verified) Dipyridamole  and Penicillins   History: Past Medical History:  Diagnosis Date   Actinic keratosis    Arthritis    Osteo - knees   Complication of anesthesia    bowels feel asleep  after knee surgery   GERD (gastroesophageal reflux disease)    occas   Hypertension    Pneumonia    PONV (postoperative nausea and vomiting)    Sleep apnea    Status post arthroscopy 04/11/2017   Right Knee May 2018 Dr. Aubry Blase   Stroke [facility])    TIA 2017   TIA (transient ischemic attack) 11/03/2016   Vertigo 01/2011   patient reports he ate lots of candy that day   Wears hearing aid    bilateral   Wears partial dentures    upper   Past Surgical History:  Procedure Laterality Date   CHOLECYSTECTOMY  11/25/2005   COLONOSCOPY N/A 04/14/2015   Procedure: COLONOSCOPY;  Surgeon: Marnee Sink, MD;  Location: [facility];  Service: Gastroenterology;  Laterality: N/A;   EYE SURGERY     cataract both eyes   KNEE ARTHROPLASTY Right 11/30/2018   Procedure: COMPUTER ASSISTED TOTAL KNEE ARTHROPLASTY;  Surgeon: Arlyne Lame, MD;  Location: [facility];  Service: Orthopedics;  Laterality: Right;   KNEE ARTHROPLASTY Left 08/26/2022   Procedure: COMPUTER ASSISTED TOTAL KNEE ARTHROPLASTY;  Surgeon: Arlyne Lame, MD;  Location: [facility];  Service: Orthopedics;  Laterality: Left;   KNEE ARTHROSCOPY Right 11/25/2005   KNEE ARTHROSCOPY WITH MEDIAL MENISECTOMY  04/02/2017   Procedure: KNEE ARTHROSCOPY WITH MEDIAL MENISECTOMY CHONDRAPLASTY, SYNOVECTOMY;  Surgeon: Arlyne Lame, MD;  Location: [facility];  Service: Orthopedics;;   POLYPECTOMY  04/14/2015   Procedure: POLYPECTOMY INTESTINAL;  Surgeon: Marnee Sink, MD;  Location: [facility];  Service: Gastroenterology;;   Family History  Problem Relation Age of Onset   Alzheimer's disease Mother    Diabetes Father        pre diabetic   Healthy Sister    Healthy Brother    Healthy Daughter    Healthy Son    Healthy Daughter    Social History   Socioeconomic History   Marital status: Married    Spouse name: Kirstie Percy   Number of children: 3   Years of education: Not on file   Highest education level: Some college, no degree   Occupational History   Not on file  Tobacco Use   Smoking status: Former    Current packs/day: 0.00    Types: Cigarettes    Quit date: 11/25/1970    Years since quitting: 53.4   Smokeless tobacco: Never  Vaping Use   Vaping status: Never Used  Substance and Sexual Activity   Alcohol  use: No   Drug use: No   Sexual activity: Not on file  Other Topics Concern   Not on file  Social History Narrative   Not on file   Social Drivers of Health   Financial Resource Strain: Low Risk  (05/11/2024)   Overall Financial Resource Strain (CARDIA)    Difficulty of Paying Living Expenses: Not hard at all  Food Insecurity: No Food Insecurity (05/11/2024)   Hunger Vital Sign    Worried About Running Out of Food  in the Last Year: Never true    Ran Out of Food in the Last Year: Never true  Transportation Needs: No Transportation Needs (05/11/2024)   PRAPARE - Administrator, Civil Service (Medical): No    Lack of Transportation (Non-Medical): No  Physical Activity: Sufficiently Active (05/11/2024)   Exercise Vital Sign    Days of Exercise per Week: 3 days    Minutes of Exercise per Session: 50 min  Recent Concern: Physical Activity - Insufficiently Active (05/09/2024)   Exercise Vital Sign    Days of Exercise per Week: 3 days    Minutes of Exercise per Session: 40 min  Stress: No Stress Concern Present (05/11/2024)   [employer] of Occupational Health - Occupational Stress Questionnaire    Feeling of Stress: Not at all  Social Connections: Socially Integrated (05/11/2024)   Social Connection and Isolation Panel    Frequency of Communication with Friends and Family: More than three times a week    Frequency of Social Gatherings with Friends and Family: More than three times a week    Attends Religious Services: More than 4 times per year    Active Member of [employer] or Organizations: Yes    Attends Engineer, structural: More than 4 times per year    Marital Status:  Married    Tobacco Counseling Counseling given: Not Answered    Clinical Intake:  Pre-visit preparation completed: Yes  Pain : No/denies pain     BMI - recorded: 31 Nutritional Status: BMI > 30  Obese Nutritional Risks: None Diabetes: No  Lab Results  Component Value Date   HGBA1C 5.2 08/14/2022   HGBA1C 4.8 11/11/2018   HGBA1C 5.0 11/04/2016     How often do you need to have someone help you when you read instructions, pamphlets, or other written materials from your doctor or pharmacy?: 1 - Never  Interpreter Needed?: No  Information entered by :: Dellie Fergusson, LPN   Activities of Daily Living    05/11/2024    8:17 AM 05/09/2024    6:25 PM  In your present state of health, do you have any difficulty performing the following activities:  Hearing? 1 0  Vision? 0 0  Difficulty concentrating or making decisions? 0 0  Walking or climbing stairs? 0 0  Dressing or bathing? 0 0  Doing errands, shopping? 0   Preparing Food and eating ? N N  Using the Toilet? N N  In the past six months, have you accidently leaked urine? N N  Do you have problems with loss of bowel control? N N  Managing your Medications? N N  Managing your Finances? N N  Housekeeping or managing your Housekeeping? N N    Patient Care Team: Lamon Pillow, MD as PCP - General (Family Medicine) Cara Chancellor Wiliam Harder, MD as Consulting Physician (Cardiology) Aubry Blase Robbie Chiles, MD as Consulting Physician (Orthopedic Surgery) Lesly Raspberry, MD as Consulting Physician (Otolaryngology) Curtiss Dowdy, NP as Nurse Practitioner (Neurology)  I have updated your Care Teams any recent Medical Services you may have received from other providers in the past year.     Assessment:   This is a routine wellness examination for James Compton.  Hearing/Vision screen Hearing Screening - Comments:: WEARS AIDS, BOTH EARS Vision Screening - Comments:: READING, HAS HAD CATARACT SGY- LENSCRAFTERS   Goals Addressed              This Visit's Progress    DIET - EAT  MORE FRUITS AND VEGETABLES         Depression Screen     05/11/2024    8:14 AM 05/27/2023    8:42 AM 05/07/2023    8:25 AM 10/25/2022    8:09 AM 04/19/2022    2:10 PM 04/02/2021    8:42 AM 11/22/2019    8:54 AM  PHQ 2/9 Scores  PHQ - 2 Score 0 0 0 0 0 0 0  PHQ- 9 Score 0   0 1 0     Fall Risk     05/11/2024    8:16 AM 05/09/2024    6:25 PM 05/07/2023    8:21 AM 10/25/2022    8:09 AM 04/18/2022    2:36 PM  Fall Risk   Falls in the past year? 1 1 0 0 0  Number falls in past yr: 0 0 0 0 0  Injury with Fall? 0 0 0 0 0  Risk for fall due to :   No Fall Risks No Fall Risks   Follow up Falls evaluation completed;Falls prevention discussed  Education provided;Falls prevention discussed Falls evaluation completed       Data saved with a previous flowsheet row definition    MEDICARE RISK AT HOME:  Medicare Risk at Home Any stairs in or around the home?: Yes If so, are there any without handrails?: No Home free of loose throw rugs in walkways, pet beds, electrical cords, etc?: Yes Adequate lighting in your home to reduce risk of falls?: Yes Life alert?: No Use of a cane, walker or w/c?: No Grab bars in the bathroom?: No Shower chair or bench in shower?: No Elevated toilet seat or a handicapped toilet?: No  TIMED UP AND GO:  Was the test performed?  No  Cognitive Function: 6CIT completed        05/11/2024    8:18 AM 05/07/2023    8:36 AM 10/31/2017    9:16 AM  6CIT Screen  What Year? 0 points 0 points 0 points  What month? 0 points 0 points 0 points  What time? 0 points 0 points 0 points  Count back from 20 0 points 0 points 0 points  Months in reverse 0 points 0 points 2 points  Repeat phrase 0 points 0 points 0 points  Total Score 0 points 0 points 2 points    Immunizations Immunization History  Administered Date(s) Administered   Fluad Quad(high Dose 65+) 08/04/2019, 11/05/2021, 08/27/2022   Hepatitis A, Adult  02/28/2011   Hepb-cpg 09/10/2021, 10/11/2021   Influenza, High Dose Seasonal PF 08/26/2017, 08/26/2017, 09/24/2018, 09/24/2018   Influenza-Unspecified 09/22/2012, 09/25/2016   PFIZER Comirnaty(Gray Top)Covid-19 Tri-Sucrose Vaccine 04/10/2021   PFIZER(Purple Top)SARS-COV-2 Vaccination 01/21/2020, 02/15/2020   Pneumococcal Conjugate-13 01/19/2014   Pneumococcal Polysaccharide-23 03/24/2015   Td 07/04/2004, 09/25/2010   Td (Adult),unspecified 07/04/2004   Tdap 09/25/2010, 04/02/2021, 05/15/2023   Typhoid Inactivated 02/28/2011   Zoster Recombinant(Shingrix) 04/02/2021   Zoster, Live 09/17/2011    Screening Tests Health Maintenance  Topic Date Due   Diabetic kidney evaluation - Urine ACR  Never done   Zoster Vaccines- Shingrix (2 of 2) 05/28/2021   COVID-19 Vaccine (4 - 2024-25 season) 07/27/2023   Diabetic kidney evaluation - eGFR measurement  05/14/2024   INFLUENZA VACCINE  06/25/2024   Colonoscopy  04/13/2025   Medicare Annual Wellness (AWV)  05/11/2025   DTaP/Tdap/Td (7 - Td or Tdap) 05/14/2033   Pneumococcal Vaccine: 50+ Years  Completed   Hepatitis C Screening  Completed  HPV VACCINES  Aged Out   Meningococcal B Vaccine  Aged Out    Health Maintenance  Health Maintenance Due  Topic Date Due   Diabetic kidney evaluation - Urine ACR  Never done   Zoster Vaccines- Shingrix (2 of 2) 05/28/2021   COVID-19 Vaccine (4 - 2024-25 season) 07/27/2023   Diabetic kidney evaluation - eGFR measurement  05/14/2024   Health Maintenance Items Addressed: UP TO DATE ON COLONOSCOPY; UP TO DATE ON TDAP, SHINGLES; NEEDS PNA, WANTS NO MORE COVIDS  Additional Screening:  Vision Screening: Recommended annual ophthalmology exams for early detection of glaucoma and other disorders of the eye. Would you like a referral to an eye doctor? No    Dental Screening: Recommended annual dental exams for proper oral hygiene  Community Resource Referral / Chronic Care Management: CRR required this  visit?  No   CCM required this visit?  No   Plan:    I have personally reviewed and noted the following in the patient's chart:   Medical and social history Use of alcohol , tobacco or illicit drugs  Current medications and supplements including opioid prescriptions. Patient is not currently taking opioid prescriptions. Functional ability and status Nutritional status Physical activity Advanced directives List of other physicians Hospitalizations, surgeries, and ER visits in previous 12 months Vitals Screenings to include cognitive, depression, and falls Referrals and appointments  In addition, I have reviewed and discussed with patient certain preventive protocols, quality metrics, and best practice recommendations. A written personalized care plan for preventive services as well as general preventive health recommendations were provided to patient.   James Bright, LPN   1/61/0960   After Visit Summary: (MyChart) Due to this being a telephonic visit, the after visit summary with patients personalized plan was offered to patient via MyChart   Notes: Nothing significant to report at this time.

## 2024-08-10 ENCOUNTER — Telehealth: Payer: Self-pay | Admitting: Family Medicine

## 2024-08-10 NOTE — Telephone Encounter (Signed)
 Dr. Donzella completed form for dental procedure and it has been faxed.   cmb

## 2024-08-10 NOTE — Telephone Encounter (Signed)
 Copied from CRM [id number]. Topic: Clinical - Medical Advice >> Aug 10, 2024 10:34 AM Avram MATSU wrote: Reason for CRM: Rocky would like to know if he's safe to come off Asprin, she sent in a medical clearance yesterday by fax. Pt is getting a tooth extraction and would like Md Fisher advice on this. Stated please complete the clearance and send it back.  Please advise [phone]

## 2024-09-14 ENCOUNTER — Other Ambulatory Visit: Payer: Self-pay | Admitting: Family Medicine

## 2024-09-14 DIAGNOSIS — I10 Essential (primary) hypertension: Secondary | ICD-10-CM

## 2024-09-16 ENCOUNTER — Ambulatory Visit (INDEPENDENT_AMBULATORY_CARE_PROVIDER_SITE_OTHER): Payer: Medicare Other | Admitting: Dermatology

## 2024-09-16 ENCOUNTER — Encounter: Payer: Self-pay | Admitting: Dermatology

## 2024-09-16 DIAGNOSIS — L570 Actinic keratosis: Secondary | ICD-10-CM

## 2024-09-16 DIAGNOSIS — L578 Other skin changes due to chronic exposure to nonionizing radiation: Secondary | ICD-10-CM | POA: Diagnosis not present

## 2024-09-16 DIAGNOSIS — Z7189 Other specified counseling: Secondary | ICD-10-CM

## 2024-09-16 DIAGNOSIS — L814 Other melanin hyperpigmentation: Secondary | ICD-10-CM | POA: Diagnosis not present

## 2024-09-16 DIAGNOSIS — Z79899 Other long term (current) drug therapy: Secondary | ICD-10-CM

## 2024-09-16 DIAGNOSIS — B353 Tinea pedis: Secondary | ICD-10-CM

## 2024-09-16 DIAGNOSIS — D229 Melanocytic nevi, unspecified: Secondary | ICD-10-CM

## 2024-09-16 DIAGNOSIS — Z1283 Encounter for screening for malignant neoplasm of skin: Secondary | ICD-10-CM | POA: Diagnosis not present

## 2024-09-16 MED ORDER — KETOCONAZOLE 2 % EX CREA
TOPICAL_CREAM | CUTANEOUS | 11 refills | Status: AC
Start: 2024-09-16 — End: ?

## 2024-09-16 NOTE — Patient Instructions (Addendum)
 Actinic keratoses are precancerous spots that appear secondary to cumulative UV radiation exposure/sun exposure over time. They are chronic with expected duration over 1 year. A portion of actinic keratoses will progress to squamous cell carcinoma of the skin. It is not possible to reliably predict which spots will progress to skin cancer and so treatment is recommended to prevent development of skin cancer.  Recommend daily broad spectrum sunscreen SPF 30+ to sun-exposed areas, reapply every 2 hours as needed.  Recommend staying in the shade or wearing long sleeves, sun glasses (UVA+UVB protection) and wide brim hats (4-inch brim around the entire circumference of the hat). Call for new or changing lesions.   Cryotherapy Aftercare  Wash gently with soap and water everyday.   Apply Vaseline and Band-Aid daily until healed.     Melanoma ABCDEs  Melanoma is the most dangerous type of skin cancer, and is the leading cause of death from skin disease.  You are more likely to develop melanoma if you: Have light-colored skin, light-colored eyes, or red or blond hair Spend a lot of time in the sun Tan regularly, either outdoors or in a tanning bed Have had blistering sunburns, especially during childhood Have a close family member who has had a melanoma Have atypical moles or large birthmarks  Early detection of melanoma is key since treatment is typically straightforward and cure rates are extremely high if we catch it early.   The first sign of melanoma is often a change in a mole or a new dark spot.  The ABCDE system is a way of remembering the signs of melanoma.  A for asymmetry:  The two halves do not match. B for border:  The edges of the growth are irregular. C for color:  A mixture of colors are present instead of an even brown color. D for diameter:  Melanomas are usually (but not always) greater than 6mm - the size of a pencil eraser. E for evolution:  The spot keeps changing in  size, shape, and color.  Please check your skin once per month between visits. You can use a small mirror in front and a large mirror behind you to keep an eye on the back side or your body.   If you see any new or changing lesions before your next follow-up, please call to schedule a visit.  Please continue daily skin protection including broad spectrum sunscreen SPF 30+ to sun-exposed areas, reapplying every 2 hours as needed when you're outdoors.   Staying in the shade or wearing long sleeves, sun glasses (UVA+UVB protection) and wide brim hats (4-inch brim around the entire circumference of the hat) are also recommended for sun protection.    Due to recent changes in healthcare laws, you may see results of your pathology and/or laboratory studies on MyChart before the doctors have had a chance to review them. We understand that in some cases there may be results that are confusing or concerning to you. Please understand that not all results are received at the same time and often the doctors may need to interpret multiple results in order to provide you with the best plan of care or course of treatment. Therefore, we ask that you please give us  2 business days to thoroughly review all your results before contacting the office for clarification. Should we see a critical lab result, you will be contacted sooner.   If You Need Anything After Your Visit  If you have any questions or concerns for your  doctor, please call our main line at [phone] and press option 4 to reach your doctor's medical assistant. If no one answers, please leave a voicemail as directed and we will return your call as soon as possible. Messages left after 4 pm will be answered the following business day.   You may also send us  a message via MyChart. We typically respond to MyChart messages within 1-2 business days.  For prescription refills, please ask your pharmacy to contact our office. Our fax number is  [phone].  If you have an urgent issue when the clinic is closed that cannot wait until the next business day, you can page your doctor at the number below.    Please note that while we do our best to be available for urgent issues outside of office hours, we are not available 24/7.   If you have an urgent issue and are unable to reach us , you may choose to seek medical care at your doctor's office, retail clinic, urgent care center, or emergency room.  If you have a medical emergency, please immediately call 911 or go to the emergency department.  Pager Numbers  - Dr. Hester: [phone]  - Dr. Jackquline: [phone]  - Dr. Claudene: [phone]   - Dr. Raymund: [phone]  In the event of inclement weather, please call our main line at [phone] for an update on the status of any delays or closures.  Dermatology Medication Tips: Please keep the boxes that topical medications come in in order to help keep track of the instructions about where and how to use these. Pharmacies typically print the medication instructions only on the boxes and not directly on the medication tubes.   If your medication is too expensive, please contact our office at [phone] option 4 or send us  a message through MyChart.   We are unable to tell what your co-pay for medications will be in advance as this is different depending on your insurance coverage. However, we may be able to find a substitute medication at lower cost or fill out paperwork to get insurance to cover a needed medication.   If a prior authorization is required to get your medication covered by your insurance company, please allow us  1-2 business days to complete this process.  Drug prices often vary depending on where the prescription is filled and some pharmacies may offer cheaper prices.  The website www.goodrx.com contains coupons for medications through different pharmacies. The prices here do not account for what the cost  may be with help from insurance (it may be cheaper with your insurance), but the website can give you the price if you did not use any insurance.  - You can print the associated coupon and take it with your prescription to the pharmacy.  - You may also stop by our office during regular business hours and pick up a GoodRx coupon card.  - If you need your prescription sent electronically to a different pharmacy, notify our office through [facility] or by phone at [phone] option 4.     Si Usted Necesita Algo Despus de Su Visita  Tambin puede enviarnos un mensaje a travs de Clinical cytogeneticist. Por lo general respondemos a los mensajes de MyChart en el transcurso de 1 a 2 das hbiles.  Para renovar recetas, por favor pida a su farmacia que se ponga en contacto con nuestra oficina. Randi lakes de fax es [place] [phone].  Si tiene un asunto urgente cuando la clnica est cerrada y que  no puede esperar hasta el siguiente da hbil, puede llamar/localizar a su doctor(a) al nmero que aparece a continuacin.   Por favor, tenga en cuenta que aunque hacemos todo lo posible para estar disponibles para asuntos urgentes fuera del horario de [place], no estamos disponibles las 24 horas del da, los 7 [address]  [address] de la [place].   Si tiene un problema urgente y no puede comunicarse con nosotros, puede optar por buscar atencin mdica  en el consultorio de su doctor(a), en una clnica privada, en un centro de atencin urgente o en una sala de emergencias.  Si tiene Engineer, drilling, por favor llame inmediatamente al 911 o vaya a la sala de emergencias.  Nmeros de bper  - Dr. Hester: [phone]  - Dra. Jackquline: [phone]  - Dr. Claudene: [phone]  - Dra. Kitts: [phone]  En caso de inclemencias del [place], por favor llame a nuestra lnea principal al [phone] para una actualizacin sobre el estado de cualquier retraso o cierre.  Consejos para la medicacin en dermatologa: Por  favor, guarde las cajas en las que vienen los medicamentos de uso tpico para ayudarle a seguir las instrucciones sobre dnde y cmo usarlos. Las farmacias generalmente imprimen las instrucciones del medicamento slo en las cajas y no directamente en los tubos del [place].   Si su medicamento es muy caro, por favor, pngase en contacto con landry rieger llamando al [phone] y presione la opcin 4 o envenos un mensaje a travs de Clinical cytogeneticist.   No podemos decirle cul ser su copago por los medicamentos por adelantado ya que esto es diferente dependiendo de la cobertura de su seguro. Sin embargo, es posible que podamos encontrar un medicamento sustituto a Audiological scientist un formulario para que el seguro cubra el medicamento que se considera necesario.   Si se requiere una autorizacin previa para que su compaa de seguros malta su medicamento, por favor permtanos de 1 a 2 das hbiles para completar este proceso.  Los precios de los medicamentos varan con frecuencia dependiendo del Environmental consultant de dnde se surte la receta y alguna farmacias pueden ofrecer precios ms baratos.  El sitio web www.goodrx.com tiene cupones para medicamentos de Health and safety inspector. Los precios aqu no tienen en cuenta lo que podra costar con la ayuda del seguro (puede ser ms barato con su seguro), pero el sitio web puede darle el precio si no utiliz Tourist information centre manager.  - Puede imprimir el cupn correspondiente y llevarlo con su receta a la farmacia.  - Tambin puede pasar por nuestra oficina durante el horario de atencin regular y Education officer, museum una tarjeta de cupones de GoodRx.  - Si necesita que su receta se enve electrnicamente a una farmacia diferente, informe a nuestra oficina a travs de MyChart de [place] o por telfono llamando al [phone] y presione la opcin 4.

## 2024-09-16 NOTE — Progress Notes (Signed)
 Follow-Up Visit   Subjective  James Compton is a 73 y.o. male who presents for the following: Skin Cancer Screening and Full Body Skin Exam Hx of aks and isks  The patient presents for Total-Body Skin Exam (TBSE) for skin cancer screening and mole check. The patient has spots, moles and lesions to be evaluated, some may be new or changing and the patient may have concern these could be cancer.  The following portions of the chart were reviewed this encounter and updated as appropriate: medications, allergies, medical history  Review of Systems:  No other skin or systemic complaints except as noted in HPI or Assessment and Plan.  Objective  Well appearing patient in no apparent distress; mood and affect are within normal limits.  A full examination was performed including scalp, head, eyes, ears, nose, lips, neck, chest, axillae, abdomen, back, buttocks, bilateral upper extremities, bilateral lower extremities, hands, feet, fingers, toes, fingernails, and toenails. All findings within normal limits unless otherwise noted below.   Relevant physical exam findings are noted in the Assessment and Plan.  face x 2 (2) Erythematous thin papules/macules with gritty scale.   Assessment & Plan   SKIN CANCER SCREENING PERFORMED TODAY.  ACTINIC DAMAGE - Chronic condition, secondary to cumulative UV/sun exposure - diffuse scaly erythematous macules with underlying dyspigmentation - Recommend daily broad spectrum sunscreen SPF 30+ to sun-exposed areas, reapply every 2 hours as needed.  - Staying in the shade or wearing long sleeves, sun glasses (UVA+UVB protection) and wide brim hats (4-inch brim around the entire circumference of the hat) are also recommended for sun protection.  - Call for new or changing lesions.  LENTIGINES, SEBORRHEIC KERATOSES, HEMANGIOMAS - Benign normal skin lesions - Benign-appearing - Call for any changes  MELANOCYTIC NEVI - Tan-brown and/or pink-flesh-colored  symmetric macules and papules - Benign appearing on exam today - Observation - Call clinic for new or changing moles - Recommend daily use of broad spectrum spf 30+ sunscreen to sun-exposed areas.   History of verruca vulgaris at left temple at hairline  Exam: clear at exam  Treatment Plan: Benign. Observe   TINEA PEDIS Chronic and persistent condition with duration or expected duration over one year. Condition is symptomatic / bothersome to patient. Not to goal. Exam: Scaling and maceration web spaces and over distal and lateral soles. Treatment Plan: Discussed pill or cream treatment  Reviewed Hepatic Function Panel from 02/17/2024 elevated Alk Phos - do not recommend starting terbinafine at this time Start Ketoconazole  cream to use entire foot both feet nightly for fungus   ACTINIC KERATOSIS (2) face x 2 (2) Actinic keratoses are precancerous spots that appear secondary to cumulative UV radiation exposure/sun exposure over time. They are chronic with expected duration over 1 year. A portion of actinic keratoses will progress to squamous cell carcinoma of the skin. It is not possible to reliably predict which spots will progress to skin cancer and so treatment is recommended to prevent development of skin cancer.  Recommend daily broad spectrum sunscreen SPF 30+ to sun-exposed areas, reapply every 2 hours as needed.  Recommend staying in the shade or wearing long sleeves, sun glasses (UVA+UVB protection) and wide brim hats (4-inch brim around the entire circumference of the hat). Call for new or changing lesions. Destruction of lesion - face x 2 (2) Complexity: simple   Destruction method: cryotherapy   Informed consent: discussed and consent obtained   Timeout:  patient name, date of birth, surgical site, and procedure verified  Lesion destroyed using liquid nitrogen: Yes   Region frozen until ice ball extended beyond lesion: Yes   Outcome: patient tolerated procedure well with no  complications   Post-procedure details: wound care instructions given    TINEA PEDIS OF BOTH FEET   Related Medications ketoconazole  (NIZORAL ) 2 % cream Apply topically to entire foot including toenails (both feet) at bedtime for fungus Return in about 1 year (around 09/16/2025) for TBSE.  IEleanor Blush, CMA, am acting as scribe for Alm Rhyme, MD.   Documentation: I have reviewed the above documentation for accuracy and completeness, and I agree with the above.  Alm Rhyme, MD

## 2024-10-10 ENCOUNTER — Other Ambulatory Visit: Payer: Self-pay | Admitting: Family Medicine

## 2024-10-10 DIAGNOSIS — I10 Essential (primary) hypertension: Secondary | ICD-10-CM

## 2024-10-15 ENCOUNTER — Encounter: Payer: Self-pay | Admitting: Family Medicine

## 2024-10-15 ENCOUNTER — Ambulatory Visit (INDEPENDENT_AMBULATORY_CARE_PROVIDER_SITE_OTHER): Admitting: Family Medicine

## 2024-10-15 VITALS — BP 131/71 | HR 60 | Temp 97.9°F | Resp 16 | Ht 69.0 in | Wt 197.1 lb

## 2024-10-15 DIAGNOSIS — R918 Other nonspecific abnormal finding of lung field: Secondary | ICD-10-CM

## 2024-10-15 DIAGNOSIS — K2100 Gastro-esophageal reflux disease with esophagitis, without bleeding: Secondary | ICD-10-CM | POA: Insufficient documentation

## 2024-10-15 DIAGNOSIS — I10 Essential (primary) hypertension: Secondary | ICD-10-CM | POA: Diagnosis not present

## 2024-10-15 DIAGNOSIS — E785 Hyperlipidemia, unspecified: Secondary | ICD-10-CM | POA: Diagnosis not present

## 2024-10-15 DIAGNOSIS — Z125 Encounter for screening for malignant neoplasm of prostate: Secondary | ICD-10-CM
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
## 2024-10-15 NOTE — Addendum Note (Signed)
 Addended by: GASPER NANCYANN BRAVO on: 10/15/2024 04:44 PM   Modules accepted: Orders

## 2024-10-15 NOTE — Telephone Encounter (Signed)
 Copied from CRM #[id number]. Topic: General - Other >> Oct 15, 2024  3:59 PM Avram MATSU wrote: Reason for CRM: Ferol stated the order that was put in was for current smokers and will need a new order for a CT chest wo contrast   Please advise [phone]

## 2024-10-16 LAB — COMPREHENSIVE METABOLIC PANEL WITH GFR
ALT: 20 IU/L (ref 0–44)
AST: 28 IU/L (ref 0–40)
Albumin: 4.7 g/dL (ref 3.8–4.8)
Alkaline Phosphatase: 184 IU/L — ABNORMAL HIGH (ref 47–123)
BUN/Creatinine Ratio: 13 (ref 10–24)
BUN: 13 mg/dL (ref 8–27)
Bilirubin Total: 1.5 mg/dL — ABNORMAL HIGH (ref 0.0–1.2)
CO2: 25 mmol/L (ref 20–29)
Calcium: 9.5 mg/dL (ref 8.6–10.2)
Chloride: 103 mmol/L (ref 96–106)
Creatinine, Ser: 0.97 mg/dL (ref 0.76–1.27)
Globulin, Total: 2.1 g/dL (ref 1.5–4.5)
Glucose: 94 mg/dL (ref 70–99)
Potassium: 4.2 mmol/L (ref 3.5–5.2)
Sodium: 142 mmol/L (ref 134–144)
Total Protein: 6.8 g/dL (ref 6.0–8.5)
eGFR: 81 mL/min/1.73 (ref >59)

## 2024-10-16 LAB — LIPID PANEL
Chol/HDL Ratio: 2.9 ratio (ref 0.0–5.0)
Cholesterol, Total: 120 mg/dL (ref 100–199)
HDL: 42 mg/dL (ref >39)
LDL Chol Calc (NIH): 57 mg/dL (ref 0–99)
Triglycerides: 116 mg/dL (ref 0–149)
VLDL Cholesterol Cal: 21 mg/dL (ref 5–40)

## 2024-10-16 LAB — CBC
Hematocrit: 42.8 % (ref 37.5–51.0)
Hemoglobin: 14.9 g/dL (ref 13.0–17.7)
MCH: 31 pg (ref 26.6–33.0)
MCHC: 34.8 g/dL (ref 31.5–35.7)
MCV: 89 fL (ref 79–97)
Platelets: 195 x10E3/uL (ref 150–450)
RBC: 4.8 x10E6/uL (ref 4.14–5.80)
RDW: 12.8 % (ref 11.6–15.4)
WBC: 5.9 x10E3/uL (ref 3.4–10.8)

## 2024-10-16 LAB — PSA TOTAL (REFLEX TO FREE): Prostate Specific Ag, Serum: 1.1 ng/mL (ref 0.0–4.0)

## 2024-10-16 LAB — MAGNESIUM: Magnesium: 2.2 mg/dL (ref 1.6–2.3)

## 2024-10-17 ENCOUNTER — Ambulatory Visit: Payer: Self-pay | Admitting: Family Medicine

## 2024-10-17 DIAGNOSIS — E785 Hyperlipidemia, unspecified: Secondary | ICD-10-CM

## 2024-10-17 DIAGNOSIS — I700 Atherosclerosis of aorta: Secondary | ICD-10-CM

## 2024-10-17 DIAGNOSIS — I2510 Atherosclerotic heart disease of native coronary artery without angina pectoris: Secondary | ICD-10-CM

## 2024-10-26 ENCOUNTER — Ambulatory Visit
Admission: RE | Admit: 2024-10-26 | Discharge: 2024-10-26 | Disposition: A | Discharge status: Home / Self Care | Source: Ambulatory Visit | Attending: Family Medicine | Admitting: Family Medicine

## 2024-10-26 ENCOUNTER — Ambulatory Visit: Admission: RE | Admit: 2024-10-26 | Source: Ambulatory Visit

## 2024-10-26 DIAGNOSIS — R918 Other nonspecific abnormal finding of lung field: Secondary | ICD-10-CM | POA: Diagnosis present

## 2024-11-01 ENCOUNTER — Other Ambulatory Visit: Payer: Self-pay | Admitting: Family Medicine

## 2024-11-01 DIAGNOSIS — I10 Essential (primary) hypertension: Secondary | ICD-10-CM

## 2024-11-02 ENCOUNTER — Telehealth: Payer: Self-pay | Admitting: Family Medicine

## 2024-11-02 DIAGNOSIS — I700 Atherosclerosis of aorta: Secondary | ICD-10-CM | POA: Insufficient documentation

## 2024-11-02 DIAGNOSIS — I2510 Atherosclerotic heart disease of native coronary artery without angina pectoris: Secondary | ICD-10-CM | POA: Insufficient documentation

## 2024-11-02 MED ORDER — EZETIMIBE 10 MG PO TABS: 10.0000 mg | ORAL_TABLET | Freq: Every day | ORAL | 3 refills | Status: AC

## 2024-11-02 NOTE — Telephone Encounter (Signed)
 Should just take 81. Its just as good for your heart and less likely to cause stomach and kidney problems than the 325.

## 2024-11-02 NOTE — Telephone Encounter (Signed)
 Copied from CRM [id number]. Topic: Clinical - Medication Question >> Nov 02, 2024 10:09 AM Ashley R wrote: Reason for CRM: Patient received message recommending taking 81mg  aspirin , but already takes a 325 daily. Would like to know if 81mg  should replace or to take both.

## 2024-11-02 NOTE — Telephone Encounter (Signed)
 Called patient and informed him of provider's message, patient verbalized understanding.

## 2025-05-17 ENCOUNTER — Ambulatory Visit

## 2025-09-22 ENCOUNTER — Ambulatory Visit: Admitting: Dermatology
